# Patient Record
Sex: Female | Born: 1950 | Race: Black or African American | Hispanic: No | Smoking: Never smoker
Health system: Southern US, Community
[De-identification: ages and names within clinical notes are randomized; demographics above are authoritative.]

## PROBLEM LIST (undated history)

## (undated) DIAGNOSIS — H9191 Unspecified hearing loss, right ear: Secondary | ICD-10-CM

## (undated) DIAGNOSIS — E119 Type 2 diabetes mellitus without complications: Secondary | ICD-10-CM

## (undated) DIAGNOSIS — J302 Other seasonal allergic rhinitis: Secondary | ICD-10-CM

## (undated) DIAGNOSIS — I10 Essential (primary) hypertension: Secondary | ICD-10-CM

## (undated) DIAGNOSIS — K219 Gastro-esophageal reflux disease without esophagitis: Secondary | ICD-10-CM

## (undated) DIAGNOSIS — E785 Hyperlipidemia, unspecified: Secondary | ICD-10-CM

## (undated) HISTORY — PX: PARTIAL HYSTERECTOMY: SHX80

## (undated) HISTORY — DX: Hyperlipidemia, unspecified: E78.5

## (undated) HISTORY — DX: Essential (primary) hypertension: I10

## (undated) HISTORY — PX: HAMMER TOE SURGERY: SHX385

## (undated) HISTORY — DX: Gastro-esophageal reflux disease without esophagitis: K21.9

## (undated) HISTORY — DX: Other seasonal allergic rhinitis: J30.2

## (undated) HISTORY — DX: Type 2 diabetes mellitus without complications: E11.9

---

## 2001-05-19 DIAGNOSIS — I742 Embolism and thrombosis of arteries of the upper extremities: Secondary | ICD-10-CM

## 2001-05-19 HISTORY — DX: Embolism and thrombosis of arteries of the upper extremities: I74.2

## 2011-02-26 DIAGNOSIS — J069 Acute upper respiratory infection, unspecified: Secondary | ICD-10-CM | POA: Insufficient documentation

## 2012-05-20 LAB — HM COLONOSCOPY: HM Colonoscopy: NORMAL

## 2012-11-29 LAB — HM PAP SMEAR: HM Pap smear: NORMAL

## 2013-03-08 ENCOUNTER — Ambulatory Visit: Payer: Self-pay | Admitting: Internal Medicine

## 2013-03-08 IMAGING — CR CERVICAL SPINE - COMPLETE 4+ VIEW
1 series · 7 of 7 positions shown · non-contrast
Comparison: none

REASON FOR EXAM: neck pain, radiculopathy
COMMENTS:

[Series 1: lat · 0.17mm/px · 7 of 7 slices shown]
[im 1/7]
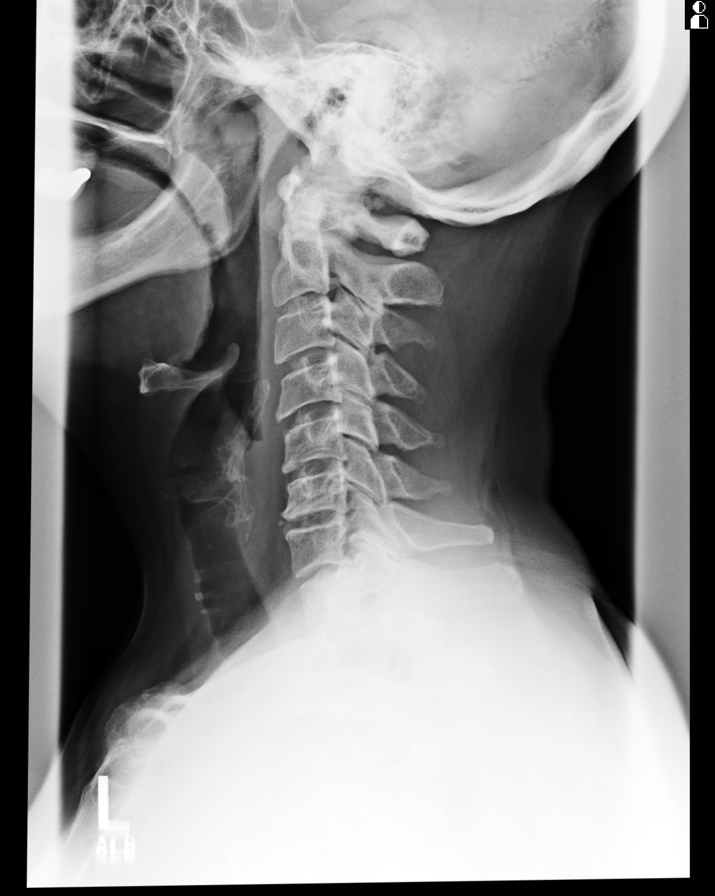
[im 2/7]
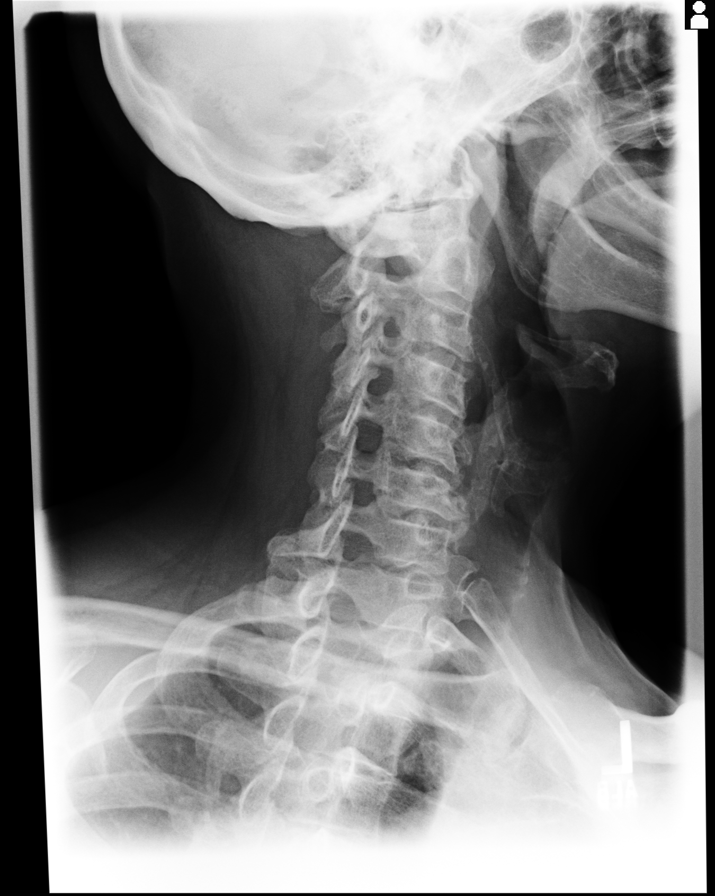
[im 3/7]
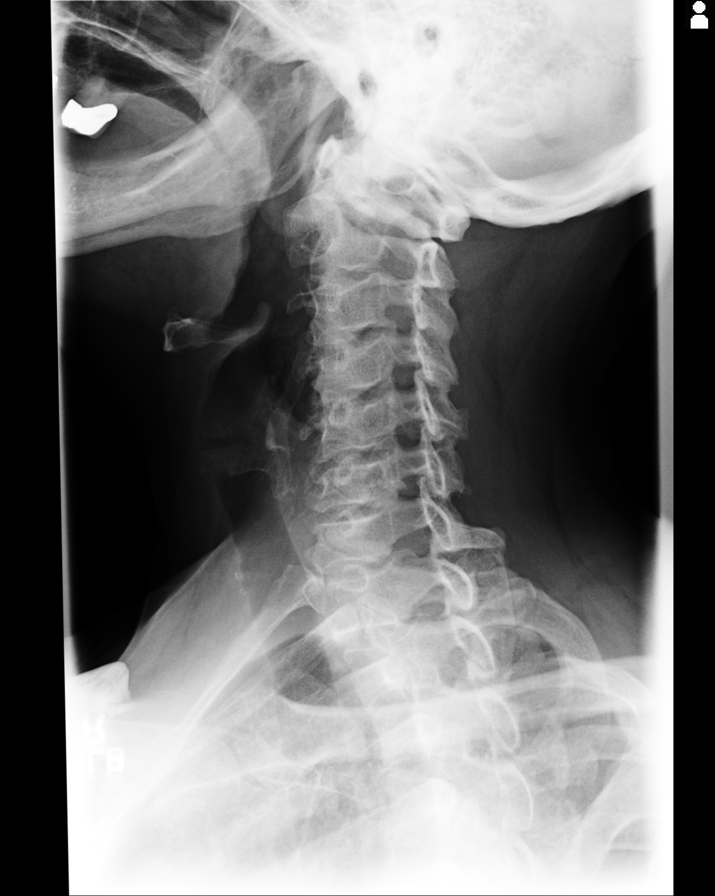
[im 4/7]
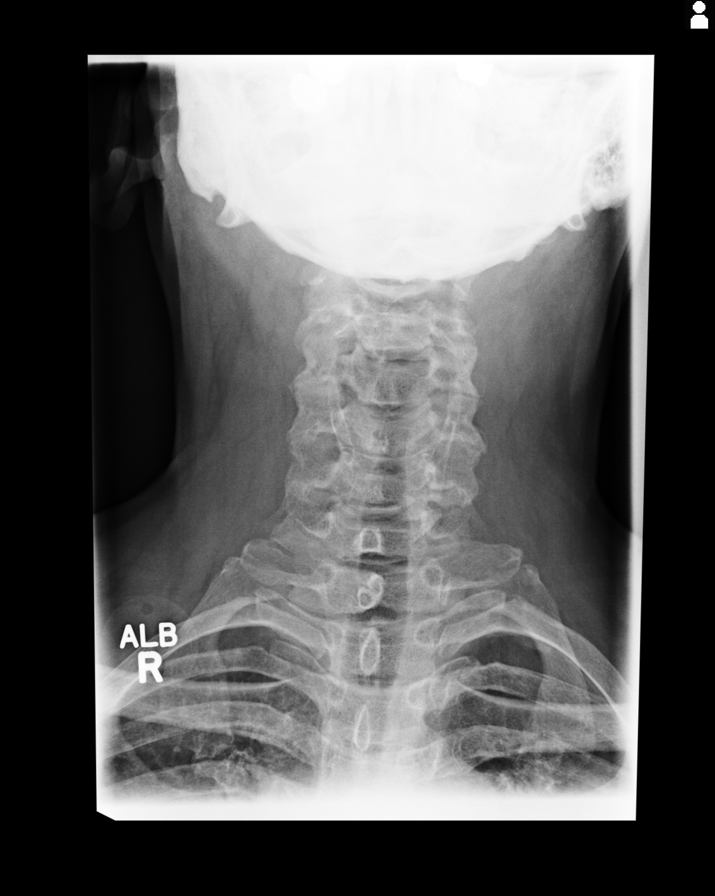
[im 5/7]
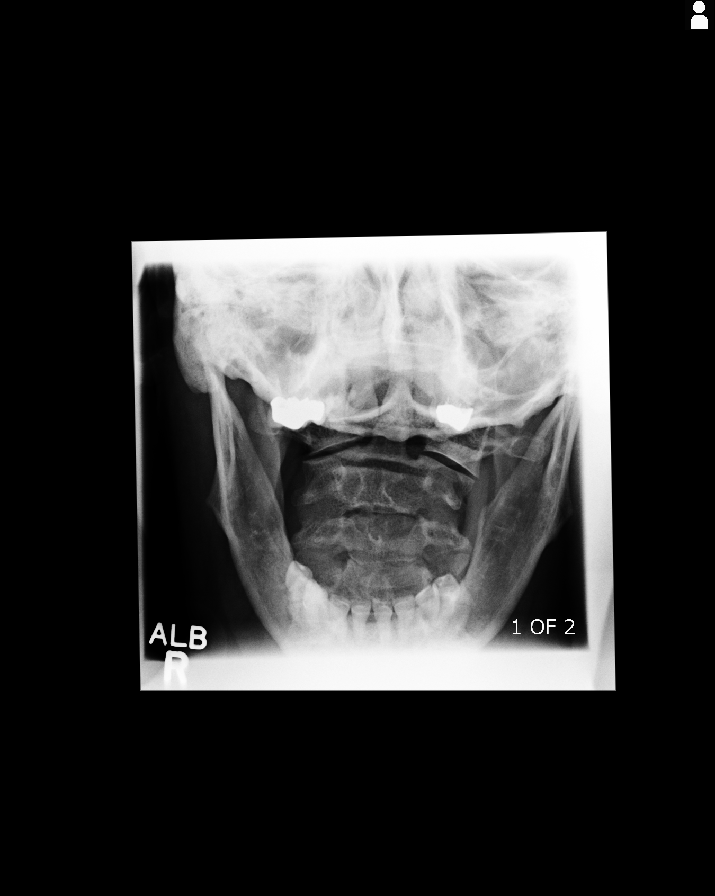
[im 6/7]
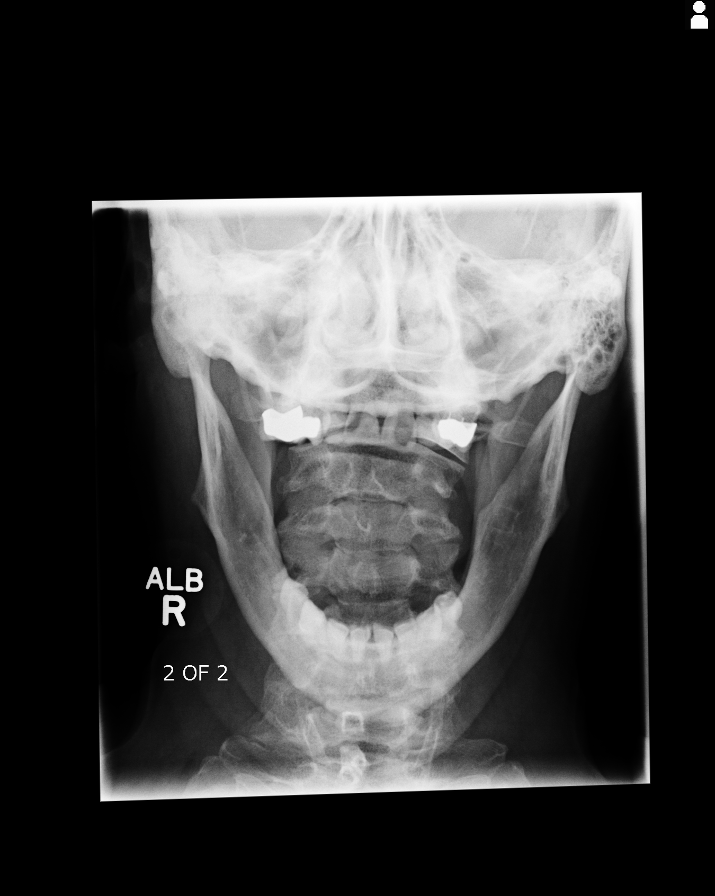
[im 7/7]
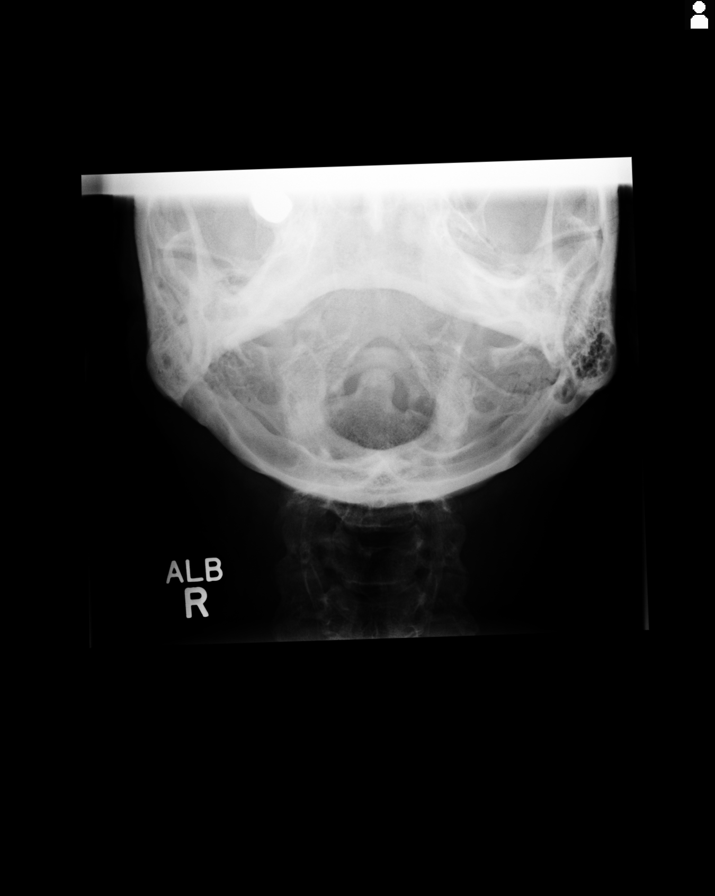

[7 of 7 positions shown; findings below may reference images not displayed]

PROCEDURE:     MDR - MDR CERVICAL SPINE COMPLETE  - [DATE]  [DATE]

RESULT:     There is loss of the normal cervical lordosis. The vertebral
body heights and intervertebral disc spaces are maintained. There is no
subluxation. The craniocervical junction appears to be unremarkable. There
is no bony destruction. There is normal alignment of the facets. Minimal
uncinate spurring is present normal left at C6-C7 and on the right at C5-C6.
The atlantoaxial alignment is normal. The odontoid appears intact.
IMPRESSION: 1. Mild degenerative change. No acute cervical spine bony abnormality or
severe bony degenerative disease.

[REDACTED]

## 2014-03-06 LAB — LIPID PANEL
Cholesterol: 146 mg/dL (ref 0–200)
HDL: 44 mg/dL (ref 35–70)
LDL Cholesterol: 88 mg/dL
Triglycerides: 70 mg/dL (ref 40–160)

## 2014-03-06 LAB — TSH: TSH: 1.3 u[IU]/mL (ref <=5.90)

## 2014-03-23 LAB — HM MAMMOGRAPHY: HM Mammogram: NORMAL

## 2014-03-28 ENCOUNTER — Ambulatory Visit: Payer: Self-pay | Admitting: Internal Medicine

## 2014-04-18 ENCOUNTER — Ambulatory Visit: Payer: Self-pay | Admitting: Internal Medicine

## 2014-05-19 ENCOUNTER — Ambulatory Visit: Payer: Self-pay | Admitting: Internal Medicine

## 2014-05-23 LAB — BASIC METABOLIC PANEL
BUN: 10 mg/dL (ref 4–21)
Creatinine: 0.6 mg/dL (ref <=1.1)

## 2014-05-23 LAB — HEMOGLOBIN A1C: Hgb A1c MFr Bld: 6.2 % — AB (ref 4.0–6.0)

## 2014-08-18 HISTORY — PX: CATARACT EXTRACTION: SUR2

## 2015-03-15 ENCOUNTER — Other Ambulatory Visit: Payer: Self-pay | Admitting: Internal Medicine

## 2015-03-15 ENCOUNTER — Encounter: Payer: Self-pay | Admitting: Internal Medicine

## 2015-03-15 DIAGNOSIS — R7989 Other specified abnormal findings of blood chemistry: Secondary | ICD-10-CM | POA: Insufficient documentation

## 2015-03-15 DIAGNOSIS — R2240 Localized swelling, mass and lump, unspecified lower limb: Secondary | ICD-10-CM | POA: Insufficient documentation

## 2015-03-15 DIAGNOSIS — K219 Gastro-esophageal reflux disease without esophagitis: Secondary | ICD-10-CM | POA: Insufficient documentation

## 2015-03-15 DIAGNOSIS — J302 Other seasonal allergic rhinitis: Secondary | ICD-10-CM | POA: Insufficient documentation

## 2015-03-15 DIAGNOSIS — E118 Type 2 diabetes mellitus with unspecified complications: Secondary | ICD-10-CM | POA: Insufficient documentation

## 2015-03-15 DIAGNOSIS — I10 Essential (primary) hypertension: Secondary | ICD-10-CM | POA: Insufficient documentation

## 2015-03-15 DIAGNOSIS — R945 Abnormal results of liver function studies: Secondary | ICD-10-CM | POA: Insufficient documentation

## 2015-03-19 ENCOUNTER — Encounter: Payer: Self-pay | Admitting: Internal Medicine

## 2015-03-19 ENCOUNTER — Ambulatory Visit (INDEPENDENT_AMBULATORY_CARE_PROVIDER_SITE_OTHER): Payer: BLUE CROSS/BLUE SHIELD | Admitting: Internal Medicine

## 2015-03-19 VITALS — BP 142/90 | HR 64 | Ht 64.0 in | Wt 167.4 lb

## 2015-03-19 DIAGNOSIS — Z1239 Encounter for other screening for malignant neoplasm of breast: Secondary | ICD-10-CM

## 2015-03-19 DIAGNOSIS — Z0000 Encounter for general adult medical examination without abnormal findings: Secondary | ICD-10-CM | POA: Diagnosis not present

## 2015-03-19 DIAGNOSIS — I10 Essential (primary) hypertension: Secondary | ICD-10-CM

## 2015-03-19 DIAGNOSIS — B353 Tinea pedis: Secondary | ICD-10-CM

## 2015-03-19 DIAGNOSIS — K219 Gastro-esophageal reflux disease without esophagitis: Secondary | ICD-10-CM

## 2015-03-19 DIAGNOSIS — Z23 Encounter for immunization: Secondary | ICD-10-CM | POA: Diagnosis not present

## 2015-03-19 DIAGNOSIS — E119 Type 2 diabetes mellitus without complications: Secondary | ICD-10-CM

## 2015-03-19 LAB — POCT URINALYSIS DIPSTICK
Bilirubin, UA: NEGATIVE
Blood, UA: NEGATIVE
Glucose, UA: NEGATIVE
Ketones, UA: NEGATIVE
Leukocytes, UA: NEGATIVE
Nitrite, UA: NEGATIVE
Protein, UA: NEGATIVE
Spec Grav, UA: 1.02
Urobilinogen, UA: 0.2
pH, UA: 5

## 2015-03-19 MED ORDER — IBUPROFEN 600 MG PO TABS: 600.0000 mg | ORAL_TABLET | Freq: Three times a day (TID) | ORAL | Status: DC | PRN

## 2015-03-19 NOTE — Progress Notes (Signed)
Date:  03/19/2015   Name:  Kathy Dougherty   DOB:  08/22/1950   MRN:  [id number]   Chief Complaint: Annual Exam Kathy Dougherty is a 64 y.o. female who presents today for her Complete Annual Exam. She feels well. She reports exercising intermittently. She reports she is sleeping well. She denies breast problems. She is due for mammogram. Hypertension This is a chronic problem. The current episode started more than 1 year ago. The problem is unchanged. The problem is controlled. Pertinent negatives include no chest pain, headaches, palpitations, peripheral edema or shortness of breath. There are no associated agents to hypertension. Past treatments include calcium channel blockers.  Diabetes She presents for her follow-up diabetic visit. She has type 2 diabetes mellitus. Her disease course has been stable. Pertinent negatives for hypoglycemia include no headaches. There are no diabetic associated symptoms. Pertinent negatives for diabetes include no chest pain, no fatigue, no polydipsia and no polyuria. Her weight is stable. She is following a generally healthy diet. Her breakfast blood glucose is taken between 7-8 am. Her breakfast blood glucose range is generally 90-110 mg/dl. Her dinner blood glucose is taken after 8 pm. Her dinner blood glucose range is generally 130-140 mg/dl. An ACE inhibitor/angiotensin II receptor blocker is not being taken. Eye exam is current.   joint pain -she has intermittent joint pain and stiffness primarily in the hips and knees. It's worse after sitting a while. After she gets moving she has no issues. No gait disturbance or falls. She takes ibuprofen 600 intermittently as needed. Seasonal allergies - patient has seasonal allergies with sneezing and postnasal drip and intermittent cough. She uses either Claritin or Zyrtec or Allegra-alternating among the 3. She also uses steroid nasal spray when necessary.   Review of Systems  Constitutional: Negative for fever, chills and  fatigue.  HENT: Positive for rhinorrhea. Negative for ear discharge, hearing loss, sneezing, sore throat, trouble swallowing and voice change.   Eyes: Negative for photophobia and visual disturbance.  Respiratory: Negative for cough, chest tightness and shortness of breath.   Cardiovascular: Negative for chest pain, palpitations and leg swelling.  Gastrointestinal: Negative for nausea, abdominal pain and constipation.  Endocrine: Negative for polydipsia and polyuria.  Genitourinary: Negative for dysuria, hematuria and pelvic pain.  Musculoskeletal: Positive for arthralgias. Negative for myalgias, back pain, joint swelling and gait problem.  Skin: Negative for color change and rash.  Neurological: Negative for syncope, light-headedness, numbness and headaches.  Hematological: Negative for adenopathy.  Psychiatric/Behavioral: Negative for sleep disturbance and dysphoric mood.    Patient Active Problem List   Diagnosis Date Noted  . Controlled type 2 diabetes mellitus without complication ([place]) 08/65/7846  . Abnormal LFTs 03/15/2015  . Essential (primary) hypertension 03/15/2015  . Gastro-esophageal reflux disease without esophagitis 03/15/2015  . Foot mass 03/15/2015  . Allergic rhinitis, seasonal 03/15/2015    Prior to Admission medications   Medication Sig Start Date End Date Taking? Authorizing Provider  amLODipine (NORVASC) 2.5 MG tablet Take 1 tablet by mouth daily. 08/30/14  Yes Historical Provider, MD  Cetirizine HCl (ZYRTEC ALLERGY) 10 MG CAPS Take 1 capsule by mouth daily as needed.   Yes Historical Provider, MD  glucose blood (ONE TOUCH ULTRA TEST) test strip  05/23/14  Yes Historical Provider, MD  metFORMIN (GLUCOPHAGE) 500 MG tablet Take 1 tablet by mouth 2 (two) times daily. 08/30/14  Yes Historical Provider, MD  mometasone (NASONEX) 50 MCG/ACT nasal spray Place 2 sprays into the nose daily.   Yes Historical  Provider, MD  omeprazole (PRILOSEC) 40 MG capsule Take 1 capsule by  mouth daily. 03/06/14  Yes Historical Provider, MD    Allergies  Allergen Reactions  . Penicillins Rash  . Sulfa Antibiotics Rash    Past Surgical History  Procedure Laterality Date  . Partial hysterectomy      fibroids    Social History  Substance Use Topics  . Smoking status: Never Smoker   . Smokeless tobacco: None  . Alcohol Use: No     Medication list has been reviewed and updated.   Physical Exam  Constitutional: She is oriented to person, place, and time. She appears well-developed and well-nourished. No distress.  HENT:  Head: Normocephalic and atraumatic.  Right Ear: Tympanic membrane and ear canal normal.  Left Ear: Tympanic membrane and ear canal normal.  Nose: Right sinus exhibits no maxillary sinus tenderness. Left sinus exhibits no maxillary sinus tenderness.  Mouth/Throat: Uvula is midline and oropharynx is clear and moist.  Eyes: Conjunctivae and EOM are normal. Right eye exhibits no discharge. Left eye exhibits no discharge. No scleral icterus.  Neck: Normal range of motion. Carotid bruit is not present. No erythema present. No thyromegaly present.  Cardiovascular: Normal rate, regular rhythm, normal heart sounds and normal pulses.   Pulmonary/Chest: Effort normal. No respiratory distress. She has no wheezes. Right breast exhibits no mass, no nipple discharge, no skin change and no tenderness. Left breast exhibits no mass, no nipple discharge, no skin change and no tenderness.  Abdominal: Soft. Bowel sounds are normal. There is no hepatosplenomegaly. There is no tenderness. There is no CVA tenderness.  Musculoskeletal: Normal range of motion.  Lymphadenopathy:    She has no cervical adenopathy.    She has no axillary adenopathy.  Neurological: She is alert and oriented to person, place, and time. She has normal reflexes. No cranial nerve deficit or sensory deficit.  Monofilament exam normal  Skin: Skin is warm and dry. No rash noted.  Tinea pedis changes  with flaking and peeling between the toes of the right foot  Psychiatric: She has a normal mood and affect. Her speech is normal and behavior is normal. Thought content normal.  Nursing note and vitals reviewed.   BP 142/90 mmHg  Pulse 64  Ht 5\' 4"  (1.626 m)  Wt 167 lb 6.4 oz (75.932 kg)  BMI 28.72 kg/m2  Assessment and Plan: 1. Annual physical exam Pap Smear done in 2014 - patient advised no further Pap smears are needed - POCT urinalysis dipstick  2. Flu vaccine need - Flu Vaccine QUAD 36+ mos PF IM (Fluarix & Fluzone Quad PF)  3. Essential (primary) hypertension Fair control continue current medication - CBC with Differential/Platelet - Comprehensive metabolic panel  4. Gastro-esophageal reflux disease without esophagitis Stable on intermittent use of Prilosec - CBC with Differential/Platelet  5. Controlled type 2 diabetes mellitus without complication, without long-term current use of insulin (HCC) Continue current regimen On thighs on medication change if needed - Hemoglobin A1c - Lipid panel - TSH - Microalbumin / creatinine urine ratio  6. Breast cancer screening Patient will schedule mammogram at [facility]  7. Tinea pedis, right Begin over-the-counter athlete's foot treatment   [name], MD [facility]  03/19/2015

## 2015-03-19 NOTE — Patient Instructions (Signed)
Breast Self-Awareness Practicing breast self-awareness may pick up problems early, prevent significant medical complications, and possibly save your life. By practicing breast self-awareness, you can become familiar with how your breasts look and feel and if your breasts are changing. This allows you to notice changes early. It can also offer you some reassurance that your breast health is good. One way to learn what is normal for your breasts and whether your breasts are changing is to do a breast self-exam. If you find a lump or something that was not present in the past, it is best to contact your caregiver right away. Other findings that should be evaluated by your caregiver include nipple discharge, especially if it is bloody; skin changes or reddening; areas where the skin seems to be pulled in (retracted); or new lumps and bumps. Breast pain is seldom associated with cancer (malignancy), but should also be evaluated by a caregiver. HOW TO PERFORM A BREAST SELF-EXAM The best time to examine your breasts is 5-7 days after your menstrual period is over. During menstruation, the breasts are lumpier, and it may be more difficult to pick up changes. If you do not menstruate, have reached menopause, or had your uterus removed (hysterectomy), you should examine your breasts at regular intervals, such as monthly. If you are breastfeeding, examine your breasts after a feeding or after using a breast pump. Breast implants do not decrease the risk for lumps or tumors, so continue to perform breast self-exams as recommended. Talk to your caregiver about how to determine the difference between the implant and breast tissue. Also, talk about the amount of pressure you should use during the exam. Over time, you will become more familiar with the variations of your breasts and more comfortable with the exam. A breast self-exam requires you to remove all your clothes above the waist. 1. Look at your breasts and nipples.  Stand in front of a mirror in a room with good lighting. With your hands on your hips, push your hands firmly downward. Look for a difference in shape, contour, and size from one breast to the other (asymmetry). Asymmetry includes puckers, dips, or bumps. Also, look for skin changes, such as reddened or scaly areas on the breasts. Look for nipple changes, such as discharge, dimpling, repositioning, or redness. 2. Carefully feel your breasts. This is best done either in the shower or tub while using soapy water or when flat on your back. Place the arm (on the side of the breast you are examining) above your head. Use the pads (not the fingertips) of your three middle fingers on your opposite hand to feel your breasts. Start in the underarm area and use  inch (2 cm) overlapping circles to feel your breast. Use 3 different levels of pressure (light, medium, and firm pressure) at each circle before moving to the next circle. The light pressure is needed to feel the tissue closest to the skin. The medium pressure will help to feel breast tissue a little deeper, while the firm pressure is needed to feel the tissue close to the ribs. Continue the overlapping circles, moving downward over the breast until you feel your ribs below your breast. Then, move one finger-width towards the center of the body. Continue to use the  inch (2 cm) overlapping circles to feel your breast as you move slowly up toward the collar bone (clavicle) near the base of the neck. Continue the up and down exam using all 3 pressures until you reach the   middle of the chest. Do this with each breast, carefully feeling for lumps or changes. 3.  Keep a written record with breast changes or normal findings for each breast. By writing this information down, you do not need to depend only on memory for size, tenderness, or location. Write down where you are in your menstrual cycle, if you are still menstruating. Breast tissue can have some lumps or  thick tissue. However, see your caregiver if you find anything that concerns you.  SEEK MEDICAL CARE IF:  You see a change in shape, contour, or size of your breasts or nipples.   You see skin changes, such as reddened or scaly areas on the breasts or nipples.   You have an unusual discharge from your nipples.   You feel a new lump or unusually thick areas.    This information is not intended to replace advice given to you by your health care provider. Make sure you discuss any questions you have with your health care provider.   Document Released: 05/05/2005 Document Revised: 04/21/2012 Document Reviewed: 08/20/2011 Elsevier Interactive Patient Education 2016 [employer]  

## 2015-03-20 LAB — COMPREHENSIVE METABOLIC PANEL
ALT: 20 IU/L (ref 0–32)
AST: 24 IU/L (ref 0–40)
Albumin/Globulin Ratio: 1.3 (ref 1.1–2.5)
Albumin: 4.3 g/dL (ref 3.6–4.8)
Alkaline Phosphatase: 83 IU/L (ref 39–117)
BUN/Creatinine Ratio: 16 (ref 11–26)
BUN: 9 mg/dL (ref 8–27)
Bilirubin Total: 0.5 mg/dL (ref 0.0–1.2)
CO2: 26 mmol/L (ref 18–29)
Calcium: 9.3 mg/dL (ref 8.7–10.3)
Chloride: 101 mmol/L (ref 97–106)
Creatinine, Ser: 0.57 mg/dL (ref 0.57–1.00)
GFR calc Af Amer: 113 mL/min/{1.73_m2} (ref >59)
GFR calc non Af Amer: 98 mL/min/{1.73_m2} (ref >59)
Globulin, Total: 3.3 g/dL (ref 1.5–4.5)
Glucose: 132 mg/dL — ABNORMAL HIGH (ref 65–99)
Potassium: 4.2 mmol/L (ref 3.5–5.2)
Sodium: 142 mmol/L (ref 136–144)
Total Protein: 7.6 g/dL (ref 6.0–8.5)

## 2015-03-20 LAB — CBC WITH DIFFERENTIAL/PLATELET
Basophils Absolute: 0 10*3/uL (ref 0.0–0.2)
Basos: 1 %
EOS (ABSOLUTE): 0.1 10*3/uL (ref 0.0–0.4)
Eos: 2 %
Hematocrit: 39.2 % (ref 34.0–46.6)
Hemoglobin: 13.3 g/dL (ref 11.1–15.9)
Immature Grans (Abs): 0 10*3/uL (ref 0.0–0.1)
Immature Granulocytes: 0 %
Lymphocytes Absolute: 1.8 10*3/uL (ref 0.7–3.1)
Lymphs: 37 %
MCH: 29.6 pg (ref 26.6–33.0)
MCHC: 33.9 g/dL (ref 31.5–35.7)
MCV: 87 fL (ref 79–97)
Monocytes Absolute: 0.4 10*3/uL (ref 0.1–0.9)
Monocytes: 8 %
Neutrophils Absolute: 2.5 10*3/uL (ref 1.4–7.0)
Neutrophils: 52 %
Platelets: 204 10*3/uL (ref 150–379)
RBC: 4.49 x10E6/uL (ref 3.77–5.28)
RDW: 13.2 % (ref 12.3–15.4)
WBC: 4.8 10*3/uL (ref 3.4–10.8)

## 2015-03-20 LAB — MICROALBUMIN / CREATININE URINE RATIO
Creatinine, Urine: 85.5 mg/dL
MICROALB/CREAT RATIO: 5.8 mg/g creat (ref 0.0–30.0)
Microalbumin, Urine: 5 ug/mL

## 2015-03-20 LAB — LIPID PANEL
Chol/HDL Ratio: 3.2 ratio units (ref 0.0–4.4)
Cholesterol, Total: 144 mg/dL (ref 100–199)
HDL: 45 mg/dL (ref >39)
LDL Calculated: 87 mg/dL (ref 0–99)
Triglycerides: 60 mg/dL (ref 0–149)

## 2015-03-20 LAB — HEMOGLOBIN A1C: Hgb A1c MFr Bld: 6.3 % — ABNORMAL HIGH (ref 4.8–5.6)

## 2015-03-20 LAB — TSH: TSH: 1.07 u[IU]/mL (ref 0.450–4.500)

## 2015-06-19 ENCOUNTER — Encounter: Payer: Self-pay | Admitting: Internal Medicine

## 2015-06-19 ENCOUNTER — Ambulatory Visit (INDEPENDENT_AMBULATORY_CARE_PROVIDER_SITE_OTHER): Payer: BLUE CROSS/BLUE SHIELD | Admitting: Internal Medicine

## 2015-06-19 DIAGNOSIS — J40 Bronchitis, not specified as acute or chronic: Secondary | ICD-10-CM | POA: Diagnosis not present

## 2015-06-19 MED ORDER — HYDROCODONE-HOMATROPINE 5-1.5 MG/5ML PO SYRP: 5.0000 mL | ORAL_SOLUTION | Freq: Four times a day (QID) | ORAL | Status: DC | PRN

## 2015-06-19 MED ORDER — LEVOFLOXACIN 500 MG PO TABS: 500.0000 mg | ORAL_TABLET | Freq: Every day | ORAL | Status: DC

## 2015-06-19 NOTE — Progress Notes (Signed)
Date:  06/19/2015   Name:  Kathy Dougherty   DOB:  08/23/1950   MRN:  [id number]   Chief Complaint: Cough Cough This is a new problem. The current episode started in the past 7 days. The problem has been gradually worsening. The problem occurs constantly. The cough is productive of purulent sputum. Associated symptoms include chills, a fever, headaches and myalgias.    Review of Systems  Constitutional: Positive for fever and chills.  Respiratory: Positive for cough.   Musculoskeletal: Positive for myalgias.  Neurological: Positive for headaches.    Patient Active Problem List   Diagnosis Date Noted  . Controlled type 2 diabetes mellitus without complication ([place]) XX123456  . Abnormal LFTs 03/15/2015  . Essential (primary) hypertension 03/15/2015  . Gastro-esophageal reflux disease without esophagitis 03/15/2015  . Foot mass 03/15/2015  . Allergic rhinitis, seasonal 03/15/2015    Prior to Admission medications   Medication Sig Start Date End Date Taking? Authorizing Provider  amLODipine (NORVASC) 2.5 MG tablet Take 1 tablet by mouth daily. 08/30/14  Yes Historical Provider, MD  Cetirizine HCl (ZYRTEC ALLERGY) 10 MG CAPS Take 1 capsule by mouth daily as needed.   Yes Historical Provider, MD  glucose blood (ONE TOUCH ULTRA TEST) test strip  05/23/14  Yes Historical Provider, MD  ibuprofen (ADVIL,MOTRIN) 600 MG tablet Take 1 tablet (600 mg total) by mouth every 8 (eight) hours as needed. 03/19/15  Yes [name], MD  metFORMIN (GLUCOPHAGE) 500 MG tablet Take 1 tablet by mouth 2 (two) times daily. 08/30/14  Yes Historical Provider, MD  mometasone (NASONEX) 50 MCG/ACT nasal spray Place 2 sprays into the nose daily.   Yes Historical Provider, MD  omeprazole (PRILOSEC) 40 MG capsule Take 1 capsule by mouth daily. 03/06/14  Yes Historical Provider, MD    Allergies  Allergen Reactions  . Penicillins Rash  . Sulfa Antibiotics Rash    Past Surgical History  Procedure  Laterality Date  . Partial hysterectomy      fibroids  . Cataract extraction  08/2014    Social History  Substance Use Topics  . Smoking status: Never Smoker   . Smokeless tobacco: None  . Alcohol Use: No   Lab Results  Component Value Date   HGBA1C 6.3* 03/19/2015   Lab Results  Component Value Date   CREATININE 0.57 03/19/2015     Medication list has been reviewed and updated.   Physical Exam  Constitutional: She is oriented to person, place, and time. She appears well-developed and well-nourished.  HENT:  Right Ear: External ear and ear canal normal.  Left Ear: External ear and ear canal normal. Tympanic membrane is not erythematous and not retracted.  Nose: Right sinus exhibits no maxillary sinus tenderness and no frontal sinus tenderness. Left sinus exhibits no maxillary sinus tenderness and no frontal sinus tenderness.  Mouth/Throat: Uvula is midline and mucous membranes are normal. No oral lesions. No oropharyngeal exudate or posterior oropharyngeal erythema.  Cardiovascular: Normal rate, regular rhythm and normal heart sounds.   Pulmonary/Chest: Effort normal and breath sounds normal. She has no wheezes. She has no rhonchi.  Lymphadenopathy:    She has no cervical adenopathy.  Neurological: She is alert and oriented to person, place, and time.  Psychiatric: She has a normal mood and affect.    BP 162/90 mmHg  Pulse 73  Temp(Src) 98.1 F (36.7 C) (Oral)  Ht 5\' 2"  (1.575 m)  Wt 174 lb (78.926 kg)  BMI 31.82 kg/m2  SpO2  98%  Assessment and Plan: 1. Bronchitis Increase fluids, rest - HYDROcodone-homatropine (HYCODAN) 5-1.5 MG/5ML syrup; Take 5 mLs by mouth every 6 (six) hours as needed for cough.  Dispense: 120 mL; Refill: 0 - levofloxacin (LEVAQUIN) 500 MG tablet; Take 1 tablet (500 mg total) by mouth daily.  Dispense: 7 tablet; Refill: 0   [name], MD [facility]  06/19/2015

## 2015-06-19 NOTE — Patient Instructions (Signed)

## 2015-06-22 ENCOUNTER — Telehealth: Payer: Self-pay

## 2015-06-22 MED ORDER — METHYLPREDNISOLONE 4 MG PO TBPK: ORAL_TABLET | ORAL | Status: DC

## 2015-06-22 NOTE — Telephone Encounter (Signed)
Patient states that she is still having a cough that is bringing up thick mucus. She would like to know if she could get a prednisone taper sent in to the [facility]. Please Advise.

## 2015-06-22 NOTE — Telephone Encounter (Signed)
Prednisone taper sent to pharmacy. 

## 2015-09-12 ENCOUNTER — Other Ambulatory Visit: Payer: Self-pay | Admitting: Internal Medicine

## 2015-09-17 ENCOUNTER — Encounter: Payer: Self-pay | Admitting: Internal Medicine

## 2015-09-17 ENCOUNTER — Other Ambulatory Visit: Payer: Self-pay

## 2015-09-17 ENCOUNTER — Ambulatory Visit (INDEPENDENT_AMBULATORY_CARE_PROVIDER_SITE_OTHER): Payer: BLUE CROSS/BLUE SHIELD | Admitting: Internal Medicine

## 2015-09-17 VITALS — BP 138/86 | HR 72 | Temp 97.8°F | Resp 16 | Ht 62.0 in | Wt 174.0 lb

## 2015-09-17 DIAGNOSIS — E119 Type 2 diabetes mellitus without complications: Secondary | ICD-10-CM | POA: Diagnosis not present

## 2015-09-17 DIAGNOSIS — I10 Essential (primary) hypertension: Secondary | ICD-10-CM | POA: Diagnosis not present

## 2015-09-17 DIAGNOSIS — K219 Gastro-esophageal reflux disease without esophagitis: Secondary | ICD-10-CM | POA: Diagnosis not present

## 2015-09-17 DIAGNOSIS — J302 Other seasonal allergic rhinitis: Secondary | ICD-10-CM | POA: Diagnosis not present

## 2015-09-17 MED ORDER — LEVOCETIRIZINE DIHYDROCHLORIDE 5 MG PO TABS: 5.0000 mg | ORAL_TABLET | Freq: Every evening | ORAL | Status: DC

## 2015-09-17 MED ORDER — METFORMIN HCL 500 MG PO TABS: 500.0000 mg | ORAL_TABLET | Freq: Two times a day (BID) | ORAL | Status: DC

## 2015-09-17 MED ORDER — OMEPRAZOLE 40 MG PO CPDR: 40.0000 mg | DELAYED_RELEASE_CAPSULE | Freq: Every day | ORAL | Status: DC

## 2015-09-17 MED ORDER — LISINOPRIL 10 MG PO TABS: 10.0000 mg | ORAL_TABLET | Freq: Every day | ORAL | Status: DC

## 2015-09-17 NOTE — Progress Notes (Signed)
Date:  09/17/2015   Name:  Kathy Dougherty   DOB:  01-29-1951   MRN:  [id number]   Chief Complaint: Diabetes and Hypertension Diabetes She presents for her follow-up diabetic visit. She has type 2 diabetes mellitus. Her disease course has been stable. Pertinent negatives for hypoglycemia include no headaches or tremors. Pertinent negatives for diabetes include no chest pain, no fatigue, no polydipsia and no polyuria. Symptoms are stable. There is no compliance with monitoring of blood glucose. An ACE inhibitor/angiotensin II receptor blocker is not being taken.  Hypertension This is a chronic problem. The current episode started more than 1 year ago. The problem is unchanged. The problem is controlled. Pertinent negatives include no chest pain, headaches, palpitations or shortness of breath. Past treatments include calcium channel blockers. The current treatment provides moderate (BP at home average 140/80) improvement.  cough - she has a dry cough that mostly affects her at night.  She has known reflux and allergies.  She takes antihistamines regularly with some benefit and only takes omeprazole for heartburn.  Lab Results  Component Value Date   HGBA1C 6.3* 03/19/2015    Lab Results  Component Value Date   CREATININE 0.57 03/19/2015     Review of Systems  Constitutional: Negative for fever, appetite change, fatigue and unexpected weight change.  HENT: Positive for congestion and postnasal drip. Negative for tinnitus, trouble swallowing and voice change.   Eyes: Negative for visual disturbance.  Respiratory: Positive for cough. Negative for chest tightness, shortness of breath and wheezing.   Cardiovascular: Negative for chest pain, palpitations and leg swelling.  Gastrointestinal: Negative for abdominal pain.  Endocrine: Negative for polydipsia and polyuria.  Genitourinary: Negative for dysuria and hematuria.  Musculoskeletal: Negative for arthralgias.  Skin: Negative for rash.    Allergic/Immunologic: Positive for environmental allergies.  Neurological: Negative for tremors, numbness and headaches.  Psychiatric/Behavioral: Negative for dysphoric mood.    Patient Active Problem List   Diagnosis Date Noted  . Controlled type 2 diabetes mellitus without complication ([place]) XX123456  . Abnormal LFTs 03/15/2015  . Essential (primary) hypertension 03/15/2015  . Gastro-esophageal reflux disease without esophagitis 03/15/2015  . Foot mass 03/15/2015  . Allergic rhinitis, seasonal 03/15/2015  . Infection of the upper respiratory tract 02/26/2011    Prior to Admission medications   Medication Sig Start Date End Date Taking? Authorizing Provider  amLODipine (NORVASC) 2.5 MG tablet TAKE 1 TABLET BY MOUTH EVERY DAY 09/12/15  Yes [name], MD  Cetirizine HCl (ZYRTEC ALLERGY) 10 MG CAPS Take 1 capsule by mouth daily as needed.   Yes Historical Provider, MD  glucose blood (ONE TOUCH ULTRA TEST) test strip  05/23/14  Yes Historical Provider, MD  ibuprofen (ADVIL,MOTRIN) 600 MG tablet Take 1 tablet (600 mg total) by mouth every 8 (eight) hours as needed. 03/19/15  Yes [name], MD  metFORMIN (GLUCOPHAGE) 500 MG tablet Take 1 tablet by mouth 2 (two) times daily. 08/30/14  Yes Historical Provider, MD  mometasone (NASONEX) 50 MCG/ACT nasal spray Place 2 sprays into the nose daily.   Yes Historical Provider, MD  omeprazole (PRILOSEC) 40 MG capsule Take 1 capsule by mouth daily. 03/06/14  Yes Historical Provider, MD  HYDROcodone-homatropine (HYCODAN) 5-1.5 MG/5ML syrup Take 5 mLs by mouth every 6 (six) hours as needed for cough. Patient not taking: Reported on 09/17/2015 06/19/15   [name], MD  levofloxacin (LEVAQUIN) 500 MG tablet Take 1 tablet (500 mg total) by mouth daily. Patient not  taking: Reported on 09/17/2015 06/19/15   [name], MD  methylPREDNISolone (MEDROL DOSEPAK) 4 MG TBPK tablet Take 6 pills on day 1 the 5 pills day 2 then 4 pills day 3 then 3  pills day 4 then 2 pills day 5 then one pills day 6 then stop Patient not taking: Reported on 09/17/2015 06/22/15   [name], MD    Allergies  Allergen Reactions  . Penicillins Rash  . Sulfa Antibiotics Rash    Past Surgical History  Procedure Laterality Date  . Partial hysterectomy      fibroids  . Cataract extraction  08/2014    Social History  Substance Use Topics  . Smoking status: Never Smoker   . Smokeless tobacco: None  . Alcohol Use: No     Medication list has been reviewed and updated.   Physical Exam  Constitutional: She is oriented to person, place, and time. She appears well-developed. No distress.  HENT:  Head: Normocephalic and atraumatic.  Neck: Normal range of motion. Neck supple. Carotid bruit is not present.  Cardiovascular: Regular rhythm and normal heart sounds.   Pulmonary/Chest: Effort normal and breath sounds normal. No respiratory distress.  Neurological: She is alert and oriented to person, place, and time.  Skin: Skin is warm and dry. No rash noted.  Psychiatric: She has a normal mood and affect. Her behavior is normal. Thought content normal.  Nursing note and vitals reviewed.   BP 138/86 mmHg  Pulse 72  Temp(Src) 97.8 F (36.6 C) (Oral)  Resp 16  Ht 5\' 2"  (1.575 m)  Wt 174 lb (78.926 kg)  BMI 31.82 kg/m2  SpO2 99%  Assessment and Plan: 1. Essential (primary) hypertension Add ACEI - lisinopril (PRINIVIL,ZESTRIL) 10 MG tablet; Take 1 tablet (10 mg total) by mouth daily.  Dispense: 30 tablet; Refill: 12  2. Controlled type 2 diabetes mellitus without complication, without long-term current use of insulin (HCC) Continue oral agents Check BS several times per month - Hemoglobin A1c - metFORMIN (GLUCOPHAGE) 500 MG tablet; Take 1 tablet (500 mg total) by mouth 2 (two) times daily.  Dispense: 60 tablet; Refill: 12  3. Gastro-esophageal reflux disease without esophagitis Take omeprazole daily to determine improvement in cough -  omeprazole (PRILOSEC) 40 MG capsule; Take 1 capsule (40 mg total) by mouth daily.  Dispense: 30 capsule; Refill: 12  4. Allergic rhinitis, seasonal - levocetirizine (XYZAL) 5 MG tablet; Take 1 tablet (5 mg total) by mouth every evening.  Dispense: 30 tablet; Refill: Levasy, MD [facility]  09/17/2015

## 2015-09-17 NOTE — Patient Instructions (Signed)
Breast Self-Awareness Practicing breast self-awareness may pick up problems early, prevent significant medical complications, and possibly save your life. By practicing breast self-awareness, you can become familiar with how your breasts look and feel and if your breasts are changing. This allows you to notice changes early. It can also offer you some reassurance that your breast health is good. One way to learn what is normal for your breasts and whether your breasts are changing is to do a breast self-exam. If you find a lump or something that was not present in the past, it is best to contact your caregiver right away. Other findings that should be evaluated by your caregiver include nipple discharge, especially if it is bloody; skin changes or reddening; areas where the skin seems to be pulled in (retracted); or new lumps and bumps. Breast pain is seldom associated with cancer (malignancy), but should also be evaluated by a caregiver. HOW TO PERFORM A BREAST SELF-EXAM The best time to examine your breasts is 5-7 days after your menstrual period is over. During menstruation, the breasts are lumpier, and it may be more difficult to pick up changes. If you do not menstruate, have reached menopause, or had your uterus removed (hysterectomy), you should examine your breasts at regular intervals, such as monthly. If you are breastfeeding, examine your breasts after a feeding or after using a breast pump. Breast implants do not decrease the risk for lumps or tumors, so continue to perform breast self-exams as recommended. Talk to your caregiver about how to determine the difference between the implant and breast tissue. Also, talk about the amount of pressure you should use during the exam. Over time, you will become more familiar with the variations of your breasts and more comfortable with the exam. A breast self-exam requires you to remove all your clothes above the waist. 1. Look at your breasts and nipples.  Stand in front of a mirror in a room with good lighting. With your hands on your hips, push your hands firmly downward. Look for a difference in shape, contour, and size from one breast to the other (asymmetry). Asymmetry includes puckers, dips, or bumps. Also, look for skin changes, such as reddened or scaly areas on the breasts. Look for nipple changes, such as discharge, dimpling, repositioning, or redness. 2. Carefully feel your breasts. This is best done either in the shower or tub while using soapy water or when flat on your back. Place the arm (on the side of the breast you are examining) above your head. Use the pads (not the fingertips) of your three middle fingers on your opposite hand to feel your breasts. Start in the underarm area and use  inch (2 cm) overlapping circles to feel your breast. Use 3 different levels of pressure (light, medium, and firm pressure) at each circle before moving to the next circle. The light pressure is needed to feel the tissue closest to the skin. The medium pressure will help to feel breast tissue a little deeper, while the firm pressure is needed to feel the tissue close to the ribs. Continue the overlapping circles, moving downward over the breast until you feel your ribs below your breast. Then, move one finger-width towards the center of the body. Continue to use the  inch (2 cm) overlapping circles to feel your breast as you move slowly up toward the collar bone (clavicle) near the base of the neck. Continue the up and down exam using all 3 pressures until you reach the   middle of the chest. Do this with each breast, carefully feeling for lumps or changes. 3.  Keep a written record with breast changes or normal findings for each breast. By writing this information down, you do not need to depend only on memory for size, tenderness, or location. Write down where you are in your menstrual cycle, if you are still menstruating. Breast tissue can have some lumps or  thick tissue. However, see your caregiver if you find anything that concerns you.  SEEK MEDICAL CARE IF:  You see a change in shape, contour, or size of your breasts or nipples.   You see skin changes, such as reddened or scaly areas on the breasts or nipples.   You have an unusual discharge from your nipples.   You feel a new lump or unusually thick areas.    This information is not intended to replace advice given to you by your health care provider. Make sure you discuss any questions you have with your health care provider.   Document Released: 05/05/2005 Document Revised: 04/21/2012 Document Reviewed: 08/20/2011 Elsevier Interactive Patient Education 2016 [employer]  

## 2015-09-18 LAB — HEMOGLOBIN A1C
Est. average glucose Bld gHb Est-mCnc: 180 mg/dL
Hgb A1c MFr Bld: 7.9 % — ABNORMAL HIGH (ref 4.8–5.6)

## 2015-09-19 ENCOUNTER — Other Ambulatory Visit: Payer: Self-pay | Admitting: Internal Medicine

## 2015-09-19 DIAGNOSIS — E119 Type 2 diabetes mellitus without complications: Secondary | ICD-10-CM

## 2015-09-19 MED ORDER — METFORMIN HCL 500 MG PO TABS: 1000.0000 mg | ORAL_TABLET | Freq: Two times a day (BID) | ORAL | Status: DC

## 2016-01-14 ENCOUNTER — Telehealth: Payer: Self-pay

## 2016-01-14 NOTE — Telephone Encounter (Signed)
Requesting refill Vicodin she took back in January but reports no longer taking in May. States she still has knee pain and I advised OV since we have not seen her for this in some time and have not filled new Rx for 7 months. She is scheduling OV.

## 2016-01-14 NOTE — Telephone Encounter (Signed)
She was not given Vicodin for knee pain by me.  She was seen for bronchitis and got a cough syrup Rx that contained Vicodin.   I will not be prescribing Vicodin for knee pain so if that is the reason for her visit, she needs to know that I will not prescribe it.

## 2016-01-14 NOTE — Telephone Encounter (Signed)
Left message advising OV.

## 2016-01-15 ENCOUNTER — Ambulatory Visit
Admission: RE | Admit: 2016-01-15 | Discharge: 2016-01-15 | Disposition: A | Discharge status: Home / Self Care | Payer: BLUE CROSS/BLUE SHIELD | Source: Ambulatory Visit | Attending: Internal Medicine | Admitting: Internal Medicine

## 2016-01-15 ENCOUNTER — Ambulatory Visit (INDEPENDENT_AMBULATORY_CARE_PROVIDER_SITE_OTHER): Payer: BLUE CROSS/BLUE SHIELD | Admitting: Internal Medicine

## 2016-01-15 ENCOUNTER — Encounter: Payer: Self-pay | Admitting: Internal Medicine

## 2016-01-15 VITALS — BP 143/93 | HR 97 | Resp 16 | Ht 62.0 in | Wt 174.0 lb

## 2016-01-15 DIAGNOSIS — I10 Essential (primary) hypertension: Secondary | ICD-10-CM | POA: Diagnosis not present

## 2016-01-15 DIAGNOSIS — M1711 Unilateral primary osteoarthritis, right knee: Secondary | ICD-10-CM | POA: Diagnosis not present

## 2016-01-15 DIAGNOSIS — E119 Type 2 diabetes mellitus without complications: Secondary | ICD-10-CM | POA: Diagnosis not present

## 2016-01-15 DIAGNOSIS — M129 Arthropathy, unspecified: Secondary | ICD-10-CM | POA: Diagnosis not present

## 2016-01-15 DIAGNOSIS — M25561 Pain in right knee: Secondary | ICD-10-CM | POA: Insufficient documentation

## 2016-01-15 MED ORDER — TRAMADOL HCL 50 MG PO TABS: 50.0000 mg | ORAL_TABLET | Freq: Three times a day (TID) | ORAL | 2 refills | Status: DC | PRN

## 2016-01-15 NOTE — Patient Instructions (Signed)
Continue Metformin two tablets per day. Will consider adding another medication if needed.

## 2016-01-15 NOTE — Progress Notes (Signed)
Date:  01/15/2016   Name:  Kathy Dougherty   DOB:  January 23, 1951   MRN:  [id number]   Chief Complaint: Knee Pain (R and L knee pain worse at night X 3-4 weeks. ) Knee Pain   There was no injury mechanism. The pain is present in the right knee and left knee. The quality of the pain is described as aching. The pain is moderate. The pain has been worsening since onset. Pertinent negatives include no numbness. The symptoms are aggravated by weight bearing. She has tried acetaminophen and NSAIDs for the symptoms. The treatment provided mild relief.  Diabetes  She presents for her follow-up diabetic visit. She has type 2 diabetes mellitus. Her disease course has been stable. Pertinent negatives for hypoglycemia include no headaches or tremors. Pertinent negatives for diabetes include no chest pain, no fatigue, no polydipsia and no polyuria. Current diabetic treatment includes oral agent (monotherapy) (could not tolerate max dose metformin). She is compliant with treatment all of the time (taking metformin twice a day).  Hypertension  This is a chronic problem. The current episode started more than 1 year ago. The problem is unchanged. The problem is controlled. Pertinent negatives include no chest pain, headaches, palpitations or shortness of breath.      Review of Systems  Constitutional: Negative for appetite change, diaphoresis, fatigue, fever and unexpected weight change.  HENT: Negative for tinnitus and trouble swallowing.   Eyes: Negative for visual disturbance.  Respiratory: Negative for cough, choking, chest tightness and shortness of breath.   Cardiovascular: Negative for chest pain, palpitations and leg swelling.  Gastrointestinal: Negative for abdominal pain.  Endocrine: Negative for polydipsia and polyuria.  Genitourinary: Negative for dysuria and hematuria.  Musculoskeletal: Positive for arthralgias. Negative for gait problem, joint swelling and myalgias.  Neurological: Negative for  tremors, numbness and headaches.  Psychiatric/Behavioral: Negative for dysphoric mood.    Patient Active Problem List   Diagnosis Date Noted  . Controlled type 2 diabetes mellitus without complication ([name]) XX123456  . Abnormal LFTs 03/15/2015  . Essential (primary) hypertension 03/15/2015  . Gastro-esophageal reflux disease without esophagitis 03/15/2015  . Allergic rhinitis, seasonal 03/15/2015  . Infection of the upper respiratory tract 02/26/2011    Prior to Admission medications   Medication Sig Start Date End Date Taking? Authorizing Provider  amLODipine (NORVASC) 2.5 MG tablet TAKE 1 TABLET BY MOUTH EVERY DAY 09/12/15  Yes [name], MD  glucose blood (ONE TOUCH ULTRA TEST) test strip  05/23/14  Yes Historical Provider, MD  ibuprofen (ADVIL,MOTRIN) 600 MG tablet Take 1 tablet (600 mg total) by mouth every 8 (eight) hours as needed. 03/19/15  Yes [name], MD  levocetirizine (XYZAL) 5 MG tablet Take 1 tablet (5 mg total) by mouth every evening. 09/17/15  Yes [name], MD  lisinopril (PRINIVIL,ZESTRIL) 10 MG tablet Take 1 tablet (10 mg total) by mouth daily. 09/17/15  Yes [name], MD  metFORMIN (GLUCOPHAGE) 500 MG tablet Take 2 tablets (1,000 mg total) by mouth 2 (two) times daily. 09/19/15  Yes [name], MD  mometasone (NASONEX) 50 MCG/ACT nasal spray Place 2 sprays into the nose daily.   Yes Historical Provider, MD  omeprazole (PRILOSEC) 40 MG capsule Take 1 capsule (40 mg total) by mouth daily. 09/17/15  Yes [name], MD    Allergies  Allergen Reactions  . Penicillins Rash  . Sulfa Antibiotics Rash    Past Surgical History:  Procedure Laterality Date  .  CATARACT EXTRACTION  08/2014  . PARTIAL HYSTERECTOMY     fibroids    Social History  Substance Use Topics  . Smoking status: Never Smoker  . Smokeless tobacco: Never Used  . Alcohol use No     Medication list has been reviewed and updated.   Physical Exam  Constitutional:  She is oriented to person, place, and time. She appears well-developed. No distress.  HENT:  Head: Normocephalic and atraumatic.  Cardiovascular: Normal rate, regular rhythm and normal heart sounds.   Pulmonary/Chest: Effort normal and breath sounds normal. No respiratory distress.  Musculoskeletal:       Right knee: She exhibits decreased range of motion. She exhibits no swelling and no effusion. Tenderness found.       Left knee: She exhibits normal range of motion, no swelling and no effusion. No tenderness found.  Neurological: She is alert and oriented to person, place, and time.  Skin: Skin is warm and dry. No rash noted.  Psychiatric: She has a normal mood and affect. Her behavior is normal. Thought content normal.  Nursing note and vitals reviewed.   BP (!) 143/93 (BP Location: Right Arm, Patient Position: Sitting, Cuff Size: Normal)   Pulse 97   Resp 16   Ht 5\' 2"  (1.575 m)   Wt 174 lb (78.9 kg)   SpO2 (!) 77%   BMI 31.83 kg/m   Assessment and Plan: 1. Arthritis of right knee Begin ultram as needed; Xray right knee - traMADol (ULTRAM) 50 MG tablet; Take 1 tablet (50 mg total) by mouth every 8 (eight) hours as needed.  Dispense: 60 tablet; Refill: 2 - DG Knee Complete 4 Views Right; Future  2. Essential (primary) hypertension controlled  3. Controlled type 2 diabetes mellitus without complication, without long-term current use of insulin ([name]) Could not tolerate metformin at max dose - continue 500 mg bid - Hemoglobin A1c   [name], MD [facility]  01/15/2016

## 2016-01-16 LAB — HEMOGLOBIN A1C
Est. average glucose Bld gHb Est-mCnc: 157 mg/dL
Hgb A1c MFr Bld: 7.1 % — ABNORMAL HIGH (ref 4.8–5.6)

## 2016-03-20 ENCOUNTER — Ambulatory Visit (INDEPENDENT_AMBULATORY_CARE_PROVIDER_SITE_OTHER): Payer: Medicare Other | Admitting: Internal Medicine

## 2016-03-20 ENCOUNTER — Encounter: Payer: Self-pay | Admitting: Internal Medicine

## 2016-03-20 ENCOUNTER — Encounter (INDEPENDENT_AMBULATORY_CARE_PROVIDER_SITE_OTHER): Payer: Self-pay

## 2016-03-20 ENCOUNTER — Other Ambulatory Visit: Payer: Self-pay | Admitting: Internal Medicine

## 2016-03-20 VITALS — BP 162/86 | HR 81 | Resp 16 | Ht 62.0 in | Wt 170.0 lb

## 2016-03-20 DIAGNOSIS — I10 Essential (primary) hypertension: Secondary | ICD-10-CM

## 2016-03-20 DIAGNOSIS — Z0000 Encounter for general adult medical examination without abnormal findings: Secondary | ICD-10-CM

## 2016-03-20 DIAGNOSIS — K219 Gastro-esophageal reflux disease without esophagitis: Secondary | ICD-10-CM

## 2016-03-20 DIAGNOSIS — Z124 Encounter for screening for malignant neoplasm of cervix: Secondary | ICD-10-CM

## 2016-03-20 DIAGNOSIS — Z23 Encounter for immunization: Secondary | ICD-10-CM | POA: Diagnosis not present

## 2016-03-20 DIAGNOSIS — E119 Type 2 diabetes mellitus without complications: Secondary | ICD-10-CM

## 2016-03-20 DIAGNOSIS — J302 Other seasonal allergic rhinitis: Secondary | ICD-10-CM

## 2016-03-20 LAB — POCT URINALYSIS DIPSTICK
Bilirubin, UA: NEGATIVE
Blood, UA: NEGATIVE
Glucose, UA: NEGATIVE
Ketones, UA: NEGATIVE
Leukocytes, UA: NEGATIVE
Nitrite, UA: NEGATIVE
Protein, UA: NEGATIVE
Spec Grav, UA: 1.02
Urobilinogen, UA: 0.2
pH, UA: 6

## 2016-03-20 MED ORDER — LEVOCETIRIZINE DIHYDROCHLORIDE 5 MG PO TABS: 5.0000 mg | ORAL_TABLET | Freq: Every evening | ORAL | 5 refills | Status: DC

## 2016-03-20 MED ORDER — HYDROCODONE-HOMATROPINE 5-1.5 MG/5ML PO SYRP: 5.0000 mL | ORAL_SOLUTION | Freq: Four times a day (QID) | ORAL | 0 refills | Status: DC | PRN

## 2016-03-20 NOTE — Progress Notes (Signed)
Date:  03/20/2016   Name:  Kathy Dougherty   DOB:  1950-11-09   MRN:  [id number]   Chief Complaint: Annual Exam (pap and flu shot /pneumonia shot ) and Diabetes (Wants to change Metformin upsets stomach bad. ) Kathy Dougherty is a 65 y.o. female who presents today for her Complete Annual Exam. She feels well. She reports exercising regularly. She reports she is sleeping well. Mammogram done recently.  Hypertension  Pertinent negatives include no chest pain, headaches, palpitations or shortness of breath.  Diabetes  She presents for her follow-up diabetic visit. She has type 2 diabetes mellitus. Pertinent negatives for hypoglycemia include no dizziness, headaches, nervousness/anxiousness or tremors. Pertinent negatives for diabetes include no chest pain, no fatigue, no polydipsia and no polyuria. Current diabetic treatment includes diet (metformin causes diarrhea).  Gastroesophageal Reflux  She reports no abdominal pain, no chest pain, no coughing or no wheezing. Pertinent negatives include no fatigue.      Review of Systems  Constitutional: Negative for chills, fatigue and fever.  HENT: Negative for congestion, hearing loss, tinnitus, trouble swallowing and voice change.   Eyes: Negative for visual disturbance.  Respiratory: Negative for cough, chest tightness, shortness of breath and wheezing.   Cardiovascular: Negative for chest pain, palpitations and leg swelling.  Gastrointestinal: Negative for abdominal pain, constipation, diarrhea and vomiting.       Occasional indigestion  Endocrine: Negative for polydipsia and polyuria.  Genitourinary: Negative for dysuria, frequency, genital sores, vaginal bleeding and vaginal discharge.  Musculoskeletal: Positive for myalgias (left upper arm muscle). Negative for arthralgias, gait problem and joint swelling.  Skin: Negative for color change and rash.  Neurological: Negative for dizziness, tremors, light-headedness and headaches.    Hematological: Negative for adenopathy. Does not bruise/bleed easily.  Psychiatric/Behavioral: Negative for dysphoric mood and sleep disturbance. The patient is not nervous/anxious.     Patient Active Problem List   Diagnosis Date Noted  . Arthritis of right knee 01/15/2016  . Controlled type 2 diabetes mellitus without complication ([place]) XX123456  . Abnormal LFTs 03/15/2015  . Essential (primary) hypertension 03/15/2015  . Gastro-esophageal reflux disease without esophagitis 03/15/2015  . Allergic rhinitis, seasonal 03/15/2015    Prior to Admission medications   Medication Sig Start Date End Date Taking? Authorizing Provider  amLODipine (NORVASC) 2.5 MG tablet TAKE 1 TABLET BY MOUTH EVERY DAY 09/12/15  Yes [name], MD  glucose blood (ONE TOUCH ULTRA TEST) test strip  05/23/14  Yes Historical Provider, MD  ibuprofen (ADVIL,MOTRIN) 600 MG tablet Take 1 tablet (600 mg total) by mouth every 8 (eight) hours as needed. 03/19/15  Yes [name], MD  levocetirizine (XYZAL) 5 MG tablet Take 1 tablet (5 mg total) by mouth every evening. 09/17/15  Yes [name], MD  lisinopril (PRINIVIL,ZESTRIL) 10 MG tablet Take 1 tablet (10 mg total) by mouth daily. 09/17/15  Yes [name], MD  metFORMIN (GLUCOPHAGE) 500 MG tablet Take 2 tablets (1,000 mg total) by mouth 2 (two) times daily. Patient taking differently: Take 500 mg by mouth 2 (two) times daily with a meal.  09/19/15  Yes [name], MD  mometasone (NASONEX) 50 MCG/ACT nasal spray Place 2 sprays into the nose daily.   Yes Historical Provider, MD  omeprazole (PRILOSEC) 40 MG capsule Take 1 capsule (40 mg total) by mouth daily. 09/17/15  Yes [name], MD  traMADol (ULTRAM) 50 MG tablet Take 1 tablet (50 mg total) by mouth every 8 (eight)  hours as needed. 01/15/16  Yes [name], MD    Allergies  Allergen Reactions  . Penicillins Rash  . Sulfa Antibiotics Rash    Past Surgical History:  Procedure  Laterality Date  . CATARACT EXTRACTION  08/2014  . PARTIAL HYSTERECTOMY     fibroids    Social History  Substance Use Topics  . Smoking status: Never Smoker  . Smokeless tobacco: Never Used  . Alcohol use No     Medication list has been reviewed and updated.   Physical Exam  Constitutional: She is oriented to person, place, and time. She appears well-developed and well-nourished. No distress.  HENT:  Head: Normocephalic and atraumatic.  Right Ear: Tympanic membrane and ear canal normal.  Left Ear: Tympanic membrane and ear canal normal.  Nose: Right sinus exhibits no maxillary sinus tenderness. Left sinus exhibits no maxillary sinus tenderness.  Mouth/Throat: Uvula is midline and oropharynx is clear and moist.  Eyes: Conjunctivae and EOM are normal. Right eye exhibits no discharge. Left eye exhibits no discharge. No scleral icterus.  Neck: Normal range of motion. Carotid bruit is not present. No erythema present. No thyromegaly present.  Cardiovascular: Normal rate, regular rhythm, normal heart sounds and normal pulses.   Pulmonary/Chest: Effort normal. No respiratory distress. She has no wheezes. Right breast exhibits no mass, no nipple discharge, no skin change and no tenderness. Left breast exhibits no mass, no nipple discharge, no skin change and no tenderness.  Abdominal: Soft. Bowel sounds are normal. There is no hepatosplenomegaly. There is no tenderness. There is no CVA tenderness.  Genitourinary: Uterus normal. There is no tenderness, lesion or injury on the right labia. There is no lesion or injury on the left labia. Cervix exhibits no motion tenderness and no friability. Right adnexum displays no mass, no tenderness and no fullness. Left adnexum displays no mass. There is erythema (atrophic) in the vagina.  Musculoskeletal: Normal range of motion. She exhibits no edema or tenderness.       Arms: Lymphadenopathy:    She has no cervical adenopathy.    She has no axillary  adenopathy.  Neurological: She is alert and oriented to person, place, and time. She has normal reflexes. No cranial nerve deficit or sensory deficit.  Skin: Skin is warm, dry and intact. No rash noted.  Psychiatric: She has a normal mood and affect. Her speech is normal and behavior is normal. Thought content normal.  Nursing note and vitals reviewed.   BP (!) 162/86   Pulse 81   Resp 16   Ht 5\' 2"  (1.575 m)   Wt 170 lb (77.1 kg)   SpO2 99%   BMI 31.09 kg/m   Assessment and Plan: 1. Annual physical exam Will need to return for MAW - POCT urinalysis dipstick  2. Cervical cancer screening - Pap IG (Image Guided)  3. Essential (primary) hypertension controlled - CBC with Differential/Platelet  4. Gastro-esophageal reflux disease without esophagitis Stable on PPI  5. Controlled type 2 diabetes mellitus without complication, without long-term current use of insulin ([place]) Will check labs and advise on alternative medication to metformin - Comprehensive metabolic panel - Lipid panel - Microalbumin / creatinine urine ratio - TSH - Hemoglobin A1c  6. Chronic seasonal allergic rhinitis, unspecified trigger - levocetirizine (XYZAL) 5 MG tablet; Take 1 tablet (5 mg total) by mouth every evening.  Dispense: 30 tablet; Refill: 5  7. Need for influenza vaccination - Flu Vaccine QUAD 36+ mos IM  8. Need for pneumococcal  vaccination - Pneumococcal conjugate vaccine 13-valent IM   [name], MD [facility]  03/20/2016

## 2016-03-20 NOTE — Patient Instructions (Addendum)
Health Maintenance  Topic Date Due  . HIV Screening  02/09/1966  . DEXA SCAN  02/10/2016  . FOOT EXAM  03/18/2016  . TETANUS/TDAP  05/19/2016 (Originally 02/09/1970)  . HEMOGLOBIN A1C  07/16/2016  . OPHTHALMOLOGY EXAM  09/24/2016  . MAMMOGRAM  10/22/2016  . PNA vac Low Risk Adult (2 of 2 - PPSV23) 03/20/2017  . PAP SMEAR  03/21/2019  . COLONOSCOPY  05/20/2022  . INFLUENZA VACCINE  Completed  . ZOSTAVAX  Completed  . Hepatitis C Screening  Addressed   Pneumococcal Vaccine, Polyvalent suspension for injection What is this medicine? PNEUMOCOCCAL VACCINE (NEU mo KOK al vak SEEN) is a vaccine used to prevent pneumococcus bacterial infections. These bacteria can cause serious infections like pneumonia, meningitis, and blood infections. This vaccine will lower your chance of getting pneumonia. If you do get pneumonia, it can make your symptoms milder and your illness shorter. This vaccine will not treat an infection and will not cause infection. This vaccine is recommended for infants and young children, adults with certain medical conditions, and adults 17 years or older. This medicine may be used for other purposes; ask your health care provider or pharmacist if you have questions. What should I tell my health care provider before I take this medicine? They need to know if you have any of these conditions: -bleeding problems -fever -immune system problems -an unusual or allergic reaction to pneumococcal vaccine, diphtheria toxoid, other vaccines, latex, other medicines, foods, dyes, or preservatives -pregnant or trying to get pregnant -breast-feeding How should I use this medicine? This vaccine is for injection into a muscle. It is given by a health care professional. A copy of Vaccine Information Statements will be given before each vaccination. Read this sheet carefully each time. The sheet may change frequently. Talk to your pediatrician regarding the use of this medicine in children.  While this drug may be prescribed for children as young as 75 weeks old for selected conditions, precautions do apply. Overdosage: If you think you have taken too much of this medicine contact a poison control center or emergency room at once. NOTE: This medicine is only for you. Do not share this medicine with others. What if I miss a dose? It is important not to miss your dose. Call your doctor or health care professional if you are unable to keep an appointment. What may interact with this medicine? -medicines for cancer chemotherapy -medicines that suppress your immune function -steroid medicines like prednisone or cortisone This list may not describe all possible interactions. Give your health care provider a list of all the medicines, herbs, non-prescription drugs, or dietary supplements you use. Also tell them if you smoke, drink alcohol, or use illegal drugs. Some items may interact with your medicine. What should I watch for while using this medicine? Mild fever and pain should go away in 3 days or less. Report any unusual symptoms to your doctor or health care professional. What side effects may I notice from receiving this medicine? Side effects that you should report to your doctor or health care professional as soon as possible: -allergic reactions like skin rash, itching or hives, swelling of the face, lips, or tongue -breathing problems -confused -fast or irregular heartbeat -fever over 102 degrees F -seizures -unusual bleeding or bruising -unusual muscle weakness Side effects that usually do not require medical attention (report to your doctor or health care professional if they continue or are bothersome): -aches and pains -diarrhea -fever of 102 degrees F or  less -headache -irritable -loss of appetite -pain, tender at site where injected -trouble sleeping This list may not describe all possible side effects. Call your doctor for medical advice about side effects. You  may report side effects to FDA at 1-800-FDA-1088. Where should I keep my medicine? This does not apply. This vaccine is given in a clinic, pharmacy, doctor's office, or other health care setting and will not be stored at home. NOTE: This sheet is a summary. It may not cover all possible information. If you have questions about this medicine, talk to your doctor, pharmacist, or health care provider.    2016, Elsevier/Gold Standard. (2014-02-09 10:27:27)

## 2016-03-21 LAB — CBC WITH DIFFERENTIAL/PLATELET
Basophils Absolute: 0 10*3/uL (ref 0.0–0.2)
Basos: 1 %
EOS (ABSOLUTE): 0.2 10*3/uL (ref 0.0–0.4)
Eos: 3 %
Hematocrit: 38.4 % (ref 34.0–46.6)
Hemoglobin: 12.9 g/dL (ref 11.1–15.9)
Immature Grans (Abs): 0 10*3/uL (ref 0.0–0.1)
Immature Granulocytes: 0 %
Lymphocytes Absolute: 1.9 10*3/uL (ref 0.7–3.1)
Lymphs: 41 %
MCH: 28 pg (ref 26.6–33.0)
MCHC: 33.6 g/dL (ref 31.5–35.7)
MCV: 84 fL (ref 79–97)
Monocytes Absolute: 0.4 10*3/uL (ref 0.1–0.9)
Monocytes: 10 %
Neutrophils Absolute: 2 10*3/uL (ref 1.4–7.0)
Neutrophils: 45 %
Platelets: 204 10*3/uL (ref 150–379)
RBC: 4.6 x10E6/uL (ref 3.77–5.28)
RDW: 13.7 % (ref 12.3–15.4)
WBC: 4.5 10*3/uL (ref 3.4–10.8)

## 2016-03-21 LAB — COMPREHENSIVE METABOLIC PANEL
ALT: 20 IU/L (ref 0–32)
AST: 22 IU/L (ref 0–40)
Albumin/Globulin Ratio: 1.3 (ref 1.2–2.2)
Albumin: 4.3 g/dL (ref 3.6–4.8)
Alkaline Phosphatase: 93 IU/L (ref 39–117)
BUN/Creatinine Ratio: 14 (ref 12–28)
BUN: 9 mg/dL (ref 8–27)
Bilirubin Total: 0.5 mg/dL (ref 0.0–1.2)
CO2: 27 mmol/L (ref 18–29)
Calcium: 9.3 mg/dL (ref 8.7–10.3)
Chloride: 101 mmol/L (ref 96–106)
Creatinine, Ser: 0.65 mg/dL (ref 0.57–1.00)
GFR calc Af Amer: 108 mL/min/{1.73_m2} (ref >59)
GFR calc non Af Amer: 93 mL/min/{1.73_m2} (ref >59)
Globulin, Total: 3.3 g/dL (ref 1.5–4.5)
Glucose: 132 mg/dL — ABNORMAL HIGH (ref 65–99)
Potassium: 4.2 mmol/L (ref 3.5–5.2)
Sodium: 143 mmol/L (ref 134–144)
Total Protein: 7.6 g/dL (ref 6.0–8.5)

## 2016-03-21 LAB — HEMOGLOBIN A1C
Est. average glucose Bld gHb Est-mCnc: 157 mg/dL
Hgb A1c MFr Bld: 7.1 % — ABNORMAL HIGH (ref 4.8–5.6)

## 2016-03-21 LAB — LIPID PANEL
Chol/HDL Ratio: 3.4 ratio units (ref 0.0–4.4)
Cholesterol, Total: 141 mg/dL (ref 100–199)
HDL: 42 mg/dL (ref >39)
LDL Calculated: 87 mg/dL (ref 0–99)
Triglycerides: 61 mg/dL (ref 0–149)
VLDL Cholesterol Cal: 12 mg/dL (ref 5–40)

## 2016-03-21 LAB — TSH: TSH: 1.21 u[IU]/mL (ref 0.450–4.500)

## 2016-03-21 LAB — MICROALBUMIN / CREATININE URINE RATIO
Creatinine, Urine: 104.8 mg/dL
Microalb/Creat Ratio: 9 mg/g creat (ref 0.0–30.0)
Microalbumin, Urine: 9.4 ug/mL

## 2016-03-25 LAB — PAP IG (IMAGE GUIDED): PAP Smear Comment: 0

## 2016-03-26 ENCOUNTER — Other Ambulatory Visit: Payer: Self-pay | Admitting: Internal Medicine

## 2016-03-26 MED ORDER — GLIMEPIRIDE 2 MG PO TABS: 2.0000 mg | ORAL_TABLET | Freq: Every day | ORAL | 3 refills | Status: DC

## 2016-03-28 DIAGNOSIS — H2512 Age-related nuclear cataract, left eye: Secondary | ICD-10-CM | POA: Diagnosis not present

## 2016-04-02 ENCOUNTER — Encounter: Payer: Self-pay | Admitting: Internal Medicine

## 2016-04-02 ENCOUNTER — Ambulatory Visit (INDEPENDENT_AMBULATORY_CARE_PROVIDER_SITE_OTHER): Payer: Medicare Other | Admitting: Internal Medicine

## 2016-04-02 VITALS — BP 140/82 | HR 78 | Resp 16 | Ht 62.0 in | Wt 172.6 lb

## 2016-04-02 DIAGNOSIS — R21 Rash and other nonspecific skin eruption: Secondary | ICD-10-CM

## 2016-04-02 MED ORDER — TRIAMCINOLONE ACETONIDE 0.1 % EX CREA: 1.0000 "application " | TOPICAL_CREAM | Freq: Two times a day (BID) | CUTANEOUS | 0 refills | Status: DC

## 2016-04-02 NOTE — Progress Notes (Signed)
Date:  04/02/2016   Name:  Kathy Dougherty   DOB:  May 07, 1951   MRN:  [id number]   Chief Complaint: Rash (Started 4 days after pneummonia shot. Also had a few days of feeling off blance and dizzy. ) Rash  This is a new problem. The current episode started in the past 7 days. The affected locations include the neck. She was exposed to nothing. Pertinent negatives include no fatigue, fever or shortness of breath. Past treatments include moisturizer. The treatment provided no relief.      Review of Systems  Constitutional: Negative for fatigue and fever.  Respiratory: Negative for chest tightness and shortness of breath.   Cardiovascular: Negative for chest pain.  Musculoskeletal: Negative for arthralgias and myalgias.  Skin: Positive for rash (localized).    Patient Active Problem List   Diagnosis Date Noted  . Arthritis of right knee 01/15/2016  . Controlled type 2 diabetes mellitus without complication ([place]) XX123456  . Abnormal LFTs 03/15/2015  . Essential (primary) hypertension 03/15/2015  . Gastro-esophageal reflux disease without esophagitis 03/15/2015  . Allergic rhinitis, seasonal 03/15/2015    Prior to Admission medications   Medication Sig Start Date End Date Taking? Authorizing Provider  amLODipine (NORVASC) 2.5 MG tablet TAKE 1 TABLET BY MOUTH EVERY DAY 09/12/15  Yes [name], MD  glimepiride (AMARYL) 2 MG tablet Take 1 tablet (2 mg total) by mouth daily before breakfast. 03/26/16  Yes [name], MD  glucose blood (ONE TOUCH ULTRA TEST) test strip  05/23/14  Yes Historical Provider, MD  ibuprofen (ADVIL,MOTRIN) 600 MG tablet Take 1 tablet (600 mg total) by mouth every 8 (eight) hours as needed. 03/19/15  Yes [name], MD  levocetirizine (XYZAL) 5 MG tablet Take 1 tablet (5 mg total) by mouth every evening. 03/20/16  Yes [name], MD  lisinopril (PRINIVIL,ZESTRIL) 10 MG tablet Take 1 tablet (10 mg total) by mouth daily. 09/17/15  Yes [name], MD  mometasone (NASONEX) 50 MCG/ACT nasal spray Place 2 sprays into the nose daily.   Yes Historical Provider, MD  omeprazole (PRILOSEC) 40 MG capsule Take 1 capsule (40 mg total) by mouth daily. 09/17/15  Yes [name], MD  traMADol (ULTRAM) 50 MG tablet Take 1 tablet (50 mg total) by mouth every 8 (eight) hours as needed. Patient not taking: Reported on 04/02/2016 01/15/16   [name], MD    Allergies  Allergen Reactions  . Metformin And Related Diarrhea  . Penicillins Rash  . Sulfa Antibiotics Rash    Past Surgical History:  Procedure Laterality Date  . CATARACT EXTRACTION  08/2014  . PARTIAL HYSTERECTOMY     fibroids    Social History  Substance Use Topics  . Smoking status: Never Smoker  . Smokeless tobacco: Never Used  . Alcohol use No     Medication list has been reviewed and updated.   Physical Exam  Constitutional: She is oriented to person, place, and time. She appears well-developed. No distress.  HENT:  Head: Normocephalic and atraumatic.  Pulmonary/Chest: Effort normal. No respiratory distress.  Musculoskeletal: Normal range of motion.  Neurological: She is alert and oriented to person, place, and time.  Skin: Skin is warm and dry. No rash noted.  4 cm patch of dry papules on right side of neck.  No other rash noted on chest, back, arms or abdomen.  Psychiatric: She has a normal mood and affect. Her behavior is normal. Thought content normal.  Nursing note and vitals reviewed.   BP 140/82   Pulse 78   Resp 16   Ht 5\' 2"  (1.575 m)   Wt 172 lb 9.6 oz (78.3 kg)   SpO2 98%   BMI 31.57 kg/m   Assessment and Plan: 1. Rash and nonspecific skin eruption Follow up or call if worsening (just started glimepiride and has a sulfa allergy) - triamcinolone cream (KENALOG) 0.1 %; Apply 1 application topically 2 (two) times daily.  Dispense: 30 g; Refill: 0   [name], MD [facility]  04/02/2016

## 2016-04-15 ENCOUNTER — Ambulatory Visit
Admission: EM | Admit: 2016-04-15 | Discharge: 2016-04-15 | Discharge status: Absent without leave | Payer: BLUE CROSS/BLUE SHIELD

## 2016-04-15 NOTE — ED Triage Notes (Signed)
Pt got up out of bed yesterday morning and fell and hit her head on the desk. Has had some vision changes but no headaches. C/o stiff neck and shoulder on left side.

## 2016-04-18 DIAGNOSIS — H2512 Age-related nuclear cataract, left eye: Secondary | ICD-10-CM | POA: Diagnosis not present

## 2016-04-18 HISTORY — PX: CATARACT EXTRACTION: SUR2

## 2016-04-23 DIAGNOSIS — Z7984 Long term (current) use of oral hypoglycemic drugs: Secondary | ICD-10-CM | POA: Diagnosis not present

## 2016-04-23 DIAGNOSIS — H2512 Age-related nuclear cataract, left eye: Secondary | ICD-10-CM | POA: Diagnosis not present

## 2016-05-06 DIAGNOSIS — Z4889 Encounter for other specified surgical aftercare: Secondary | ICD-10-CM | POA: Diagnosis not present

## 2016-05-14 ENCOUNTER — Encounter: Payer: Self-pay | Admitting: Internal Medicine

## 2016-05-14 ENCOUNTER — Ambulatory Visit (INDEPENDENT_AMBULATORY_CARE_PROVIDER_SITE_OTHER): Payer: Medicare Other | Admitting: Internal Medicine

## 2016-05-14 VITALS — BP 152/98 | HR 77 | Temp 98.2°F | Ht 62.0 in | Wt 174.0 lb

## 2016-05-14 DIAGNOSIS — H1033 Unspecified acute conjunctivitis, bilateral: Secondary | ICD-10-CM | POA: Diagnosis not present

## 2016-05-14 DIAGNOSIS — J40 Bronchitis, not specified as acute or chronic: Secondary | ICD-10-CM | POA: Diagnosis not present

## 2016-05-14 MED ORDER — HYDROCODONE-HOMATROPINE 5-1.5 MG/5ML PO SYRP: 5.0000 mL | ORAL_SOLUTION | Freq: Four times a day (QID) | ORAL | 0 refills | Status: DC | PRN

## 2016-05-14 MED ORDER — NEOMYCIN-POLYMYXIN-HC 3.5-10000-1 OP SUSP: 3.0000 [drp] | Freq: Four times a day (QID) | OPHTHALMIC | 0 refills | Status: DC

## 2016-05-14 MED ORDER — LEVOFLOXACIN 500 MG PO TABS: 500.0000 mg | ORAL_TABLET | Freq: Every day | ORAL | 0 refills | Status: DC

## 2016-05-14 NOTE — Progress Notes (Addendum)
Date:  05/14/2016   Name:  Kathy Dougherty   DOB:  04/19/1951   MRN:  [id number]   Chief Complaint: Sore Throat (pt stated sore throat, eye redness, body aching for 5 days) Sore Throat   This is a new problem. The current episode started in the past 7 days. The problem has been gradually worsening. The maximum temperature recorded prior to her arrival was 100.4 - 100.9 F. Associated symptoms include congestion, coughing, a hoarse voice and shortness of breath. Pertinent negatives include no abdominal pain, diarrhea, trouble swallowing or vomiting. Associated symptoms comments: Cough productive of green phlegm. She has tried acetaminophen and NSAIDs for the symptoms.      Review of Systems  Constitutional: Positive for fatigue and fever. Negative for chills.  HENT: Positive for congestion, hoarse voice, sinus pressure, sore throat and voice change. Negative for sinus pain and trouble swallowing.   Eyes: Positive for redness and visual disturbance.  Respiratory: Positive for cough and shortness of breath. Negative for chest tightness and wheezing.   Cardiovascular: Negative for chest pain.  Gastrointestinal: Negative for abdominal pain, diarrhea and vomiting.    Patient Active Problem List   Diagnosis Date Noted  . Arthritis of right knee 01/15/2016  . Controlled type 2 diabetes mellitus without complication ([place]) XX123456  . Abnormal LFTs 03/15/2015  . Essential (primary) hypertension 03/15/2015  . Gastro-esophageal reflux disease without esophagitis 03/15/2015  . Allergic rhinitis, seasonal 03/15/2015    Prior to Admission medications   Medication Sig Start Date End Date Taking? Authorizing Provider  amLODipine (NORVASC) 2.5 MG tablet TAKE 1 TABLET BY MOUTH EVERY DAY 09/12/15  Yes [name], MD  glimepiride (AMARYL) 2 MG tablet Take 1 tablet (2 mg total) by mouth daily before breakfast. 03/26/16  Yes [name], MD  glucose blood (ONE TOUCH ULTRA TEST) test strip   05/23/14  Yes Historical Provider, MD  ibuprofen (ADVIL,MOTRIN) 600 MG tablet Take 1 tablet (600 mg total) by mouth every 8 (eight) hours as needed. 03/19/15  Yes [name], MD  levocetirizine (XYZAL) 5 MG tablet Take 1 tablet (5 mg total) by mouth every evening. 03/20/16  Yes [name], MD  lisinopril (PRINIVIL,ZESTRIL) 10 MG tablet Take 1 tablet (10 mg total) by mouth daily. 09/17/15  Yes [name], MD  omeprazole (PRILOSEC) 40 MG capsule Take 1 capsule (40 mg total) by mouth daily. 09/17/15  Yes [name], MD  traMADol (ULTRAM) 50 MG tablet Take 1 tablet (50 mg total) by mouth every 8 (eight) hours as needed. 01/15/16  Yes [name], MD    Allergies  Allergen Reactions  . Metformin And Related Diarrhea  . Penicillins Rash  . Sulfa Antibiotics Rash    Past Surgical History:  Procedure Laterality Date  . CATARACT EXTRACTION  08/2014  . PARTIAL HYSTERECTOMY     fibroids    Social History  Substance Use Topics  . Smoking status: Never Smoker  . Smokeless tobacco: Never Used  . Alcohol use No     Medication list has been reviewed and updated.   Physical Exam  Constitutional: She is oriented to person, place, and time. She appears well-developed. No distress.  HENT:  Head: Normocephalic and atraumatic.  Right Ear: Tympanic membrane and ear canal normal.  Left Ear: Tympanic membrane and ear canal normal.  Nose: Right sinus exhibits no maxillary sinus tenderness. Left sinus exhibits no maxillary sinus tenderness.  Mouth/Throat: Posterior oropharyngeal erythema present. No  oropharyngeal exudate or posterior oropharyngeal edema.  Eyes: Right eye exhibits chemosis. Left eye exhibits chemosis. Right conjunctiva is injected. Right conjunctiva has no hemorrhage. Left conjunctiva is injected. Left conjunctiva has no hemorrhage.  Cardiovascular: Normal rate, regular rhythm and normal heart sounds.   Pulmonary/Chest: Effort normal. No respiratory distress. She  has decreased breath sounds. She has wheezes in the right upper field. She has no rhonchi. She has no rales.  Musculoskeletal: Normal range of motion.  Neurological: She is alert and oriented to person, place, and time.  Skin: Skin is warm and dry. No rash noted.  Psychiatric: She has a normal mood and affect. Her speech is normal and behavior is normal. Thought content normal.  Nursing note and vitals reviewed.   BP (!) 152/98   Pulse 77   Temp 98.2 F (36.8 C)   Ht 5\' 2"  (1.575 m)   Wt 174 lb (78.9 kg)   SpO2 97%   BMI 31.83 kg/m   Assessment and Plan: 1. Bronchitis Continue fluids - levofloxacin (LEVAQUIN) 500 MG tablet; Take 1 tablet (500 mg total) by mouth daily.  Dispense: 7 tablet; Refill: 0 - HYDROcodone-homatropine (HYCODAN) 5-1.5 MG/5ML syrup; Take 5 mLs by mouth every 6 (six) hours as needed for cough.  Dispense: 120 mL; Refill: 0  2. Acute conjunctivitis of both eyes, unspecified acute conjunctivitis type Consult Ophthalmology if no improvement - neomycin-polymyxin-hydrocortisone (CORTISPORIN) 3.5-10000-1 ophthalmic suspension; Place 3 drops into both eyes 4 (four) times daily.  Dispense: 7.5 mL; Refill: 0   [name], MD [facility]  05/14/2016

## 2016-06-18 DIAGNOSIS — H20012 Primary iridocyclitis, left eye: Secondary | ICD-10-CM | POA: Diagnosis not present

## 2016-06-20 DIAGNOSIS — H20012 Primary iridocyclitis, left eye: Secondary | ICD-10-CM | POA: Diagnosis not present

## 2016-06-26 DIAGNOSIS — H20012 Primary iridocyclitis, left eye: Secondary | ICD-10-CM | POA: Diagnosis not present

## 2016-06-29 ENCOUNTER — Other Ambulatory Visit: Payer: Self-pay | Admitting: Internal Medicine

## 2016-07-03 DIAGNOSIS — H20012 Primary iridocyclitis, left eye: Secondary | ICD-10-CM | POA: Diagnosis not present

## 2016-07-10 DIAGNOSIS — H20012 Primary iridocyclitis, left eye: Secondary | ICD-10-CM | POA: Diagnosis not present

## 2016-07-14 ENCOUNTER — Other Ambulatory Visit: Payer: Self-pay | Admitting: Internal Medicine

## 2016-07-14 DIAGNOSIS — H20012 Primary iridocyclitis, left eye: Secondary | ICD-10-CM | POA: Diagnosis not present

## 2016-07-14 DIAGNOSIS — J302 Other seasonal allergic rhinitis: Secondary | ICD-10-CM

## 2016-07-18 ENCOUNTER — Encounter: Payer: Self-pay | Admitting: Internal Medicine

## 2016-07-18 ENCOUNTER — Ambulatory Visit (INDEPENDENT_AMBULATORY_CARE_PROVIDER_SITE_OTHER): Payer: Medicare HMO | Admitting: Internal Medicine

## 2016-07-18 VITALS — BP 124/78 | HR 74 | Ht 62.0 in | Wt 178.4 lb

## 2016-07-18 DIAGNOSIS — M1711 Unilateral primary osteoarthritis, right knee: Secondary | ICD-10-CM

## 2016-07-18 DIAGNOSIS — E119 Type 2 diabetes mellitus without complications: Secondary | ICD-10-CM | POA: Diagnosis not present

## 2016-07-18 DIAGNOSIS — I10 Essential (primary) hypertension: Secondary | ICD-10-CM | POA: Diagnosis not present

## 2016-07-18 DIAGNOSIS — K219 Gastro-esophageal reflux disease without esophagitis: Secondary | ICD-10-CM

## 2016-07-18 MED ORDER — AMLODIPINE BESYLATE 2.5 MG PO TABS: 2.5000 mg | ORAL_TABLET | Freq: Every day | ORAL | 5 refills | Status: DC

## 2016-07-18 MED ORDER — LISINOPRIL 10 MG PO TABS: 10.0000 mg | ORAL_TABLET | Freq: Every day | ORAL | 5 refills | Status: DC

## 2016-07-18 NOTE — Progress Notes (Signed)
Date:  07/18/2016   Name:  Kathy Dougherty   DOB:  08-02-1950   MRN:  [id number]   Chief Complaint: Hypertension and Diabetes (BS was 140's last check (unknown).) Hypertension  This is a chronic problem. The current episode started more than 1 year ago. The problem is unchanged. The problem is controlled. Pertinent negatives include no chest pain, headaches, palpitations or shortness of breath. There are no associated agents to hypertension. Past treatments include ACE inhibitors and calcium channel blockers.  Diabetes  She presents for her follow-up diabetic visit. She has type 2 diabetes mellitus. Her disease course has been stable. Pertinent negatives for hypoglycemia include no headaches or tremors. Pertinent negatives for diabetes include no chest pain, no fatigue, no polydipsia and no polyuria.  Knee Pain   There was no injury mechanism. The pain is present in the right knee. The quality of the pain is described as aching. The pain is at a severity of 1/10. The pain is mild. The pain has been fluctuating since onset. Pertinent negatives include no loss of motion, muscle weakness or numbness. She has tried NSAIDs for the symptoms. The treatment provided significant (aleve helps but causes some stomach upset) relief.    Lab Results  Component Value Date   HGBA1C 7.1 (H) 03/20/2016     Review of Systems  Constitutional: Negative for appetite change, fatigue, fever and unexpected weight change.  HENT: Negative for tinnitus and trouble swallowing.   Eyes: Negative for visual disturbance.  Respiratory: Negative for cough, chest tightness and shortness of breath.   Cardiovascular: Negative for chest pain, palpitations and leg swelling.  Gastrointestinal: Negative for abdominal pain.  Endocrine: Negative for polydipsia and polyuria.  Genitourinary: Negative for dysuria and hematuria.  Musculoskeletal: Negative for arthralgias.  Neurological: Negative for tremors, numbness and headaches.    Psychiatric/Behavioral: Negative for dysphoric mood.    Patient Active Problem List   Diagnosis Date Noted  . Arthritis of right knee 01/15/2016  . Controlled type 2 diabetes mellitus without complication ([place]) XX123456  . Abnormal LFTs 03/15/2015  . Essential (primary) hypertension 03/15/2015  . Gastro-esophageal reflux disease without esophagitis 03/15/2015  . Allergic rhinitis, seasonal 03/15/2015    Prior to Admission medications   Medication Sig Start Date End Date Taking? Authorizing Provider  amLODipine (NORVASC) 2.5 MG tablet TAKE 1 TABLET BY MOUTH EVERY DAY 09/12/15  Yes [name], MD  glimepiride (AMARYL) 2 MG tablet TAKE 1 TABLET (2 MG TOTAL) BY MOUTH DAILY BEFORE BREAKFAST. 06/29/16  Yes [name], MD  glucose blood (ONE TOUCH ULTRA TEST) test strip  05/23/14  Yes Historical Provider, MD  ibuprofen (ADVIL,MOTRIN) 600 MG tablet Take 1 tablet (600 mg total) by mouth every 8 (eight) hours as needed. 03/19/15  Yes [name], MD  levocetirizine (XYZAL) 5 MG tablet TAKE 1 TABLET (5 MG TOTAL) BY MOUTH EVERY EVENING. 07/14/16  Yes [name], MD  lisinopril (PRINIVIL,ZESTRIL) 10 MG tablet Take 1 tablet (10 mg total) by mouth daily. 09/17/15  Yes [name], MD  omeprazole (PRILOSEC) 40 MG capsule Take 1 capsule (40 mg total) by mouth daily. 09/17/15  Yes [name], MD  traMADol (ULTRAM) 50 MG tablet Take 1 tablet (50 mg total) by mouth every 8 (eight) hours as needed. 01/15/16  Yes [name], MD    Allergies  Allergen Reactions  . Metformin And Related Diarrhea  . Penicillins Rash  . Sulfa Antibiotics Rash    Past  Surgical History:  Procedure Laterality Date  . CATARACT EXTRACTION  08/2014  . PARTIAL HYSTERECTOMY     fibroids    Social History  Substance Use Topics  . Smoking status: Never Smoker  . Smokeless tobacco: Never Used  . Alcohol use No     Medication list has been reviewed and updated.   Physical Exam   Constitutional: She is oriented to person, place, and time. She appears well-developed. No distress.  HENT:  Head: Normocephalic and atraumatic.  Neck: Normal range of motion. Neck supple. Carotid bruit is not present.  Cardiovascular: Normal rate, regular rhythm and normal heart sounds.   Pulmonary/Chest: Effort normal. No respiratory distress. She has no wheezes.  Abdominal: Soft.  Musculoskeletal: Normal range of motion. She exhibits no edema or tenderness.       Right knee: Normal.       Left knee: Normal.  Neurological: She is alert and oriented to person, place, and time.  Skin: Skin is warm and dry. No rash noted.  Psychiatric: She has a normal mood and affect. Her behavior is normal. Thought content normal.  Nursing note and vitals reviewed.   BP 124/78   Pulse 74   Ht 5\' 2"  (1.575 m)   Wt 178 lb 6.4 oz (80.9 kg)   SpO2 98%   BMI 32.63 kg/m   Assessment and Plan: 1. Essential (primary) hypertension controlled - lisinopril (PRINIVIL,ZESTRIL) 10 MG tablet; Take 1 tablet (10 mg total) by mouth daily.  Dispense: 30 tablet; Refill: 5  2. Gastro-esophageal reflux disease without esophagitis stable  3. Controlled type 2 diabetes mellitus without complication, without long-term current use of insulin ([place]) May need to adjust medication - Hemoglobin A1c  4. Arthritis of right knee Continue Aleve PRN   Meds ordered this encounter  Medications  . amLODipine (NORVASC) 2.5 MG tablet    Sig: Take 1 tablet (2.5 mg total) by mouth daily.    Dispense:  30 tablet    Refill:  5  . lisinopril (PRINIVIL,ZESTRIL) 10 MG tablet    Sig: Take 1 tablet (10 mg total) by mouth daily.    Dispense:  30 tablet    Refill:  [place], MD [facility]  07/18/2016

## 2016-07-19 LAB — HEMOGLOBIN A1C
Est. average glucose Bld gHb Est-mCnc: 140 mg/dL
Hgb A1c MFr Bld: 6.5 % — ABNORMAL HIGH (ref 4.8–5.6)

## 2016-07-31 DIAGNOSIS — H20012 Primary iridocyclitis, left eye: Secondary | ICD-10-CM | POA: Diagnosis not present

## 2016-09-01 DIAGNOSIS — H20022 Recurrent acute iridocyclitis, left eye: Secondary | ICD-10-CM | POA: Diagnosis not present

## 2016-09-03 ENCOUNTER — Other Ambulatory Visit: Payer: Self-pay

## 2016-09-03 MED ORDER — GLIMEPIRIDE 2 MG PO TABS: 2.0000 mg | ORAL_TABLET | Freq: Every day | ORAL | 12 refills | Status: DC

## 2016-09-10 DIAGNOSIS — H20012 Primary iridocyclitis, left eye: Secondary | ICD-10-CM | POA: Diagnosis not present

## 2016-09-14 ENCOUNTER — Other Ambulatory Visit: Payer: Self-pay | Admitting: Internal Medicine

## 2016-10-15 DIAGNOSIS — H20012 Primary iridocyclitis, left eye: Secondary | ICD-10-CM | POA: Diagnosis not present

## 2016-10-26 ENCOUNTER — Other Ambulatory Visit: Payer: Self-pay | Admitting: Internal Medicine

## 2016-10-26 DIAGNOSIS — K219 Gastro-esophageal reflux disease without esophagitis: Secondary | ICD-10-CM

## 2016-11-12 DIAGNOSIS — H20022 Recurrent acute iridocyclitis, left eye: Secondary | ICD-10-CM | POA: Diagnosis not present

## 2016-11-17 ENCOUNTER — Ambulatory Visit (INDEPENDENT_AMBULATORY_CARE_PROVIDER_SITE_OTHER): Payer: Medicare HMO | Admitting: Internal Medicine

## 2016-11-17 ENCOUNTER — Encounter: Payer: Self-pay | Admitting: Internal Medicine

## 2016-11-17 VITALS — BP 136/86 | HR 75 | Ht 62.0 in | Wt 172.0 lb

## 2016-11-17 DIAGNOSIS — E119 Type 2 diabetes mellitus without complications: Secondary | ICD-10-CM

## 2016-11-17 DIAGNOSIS — K219 Gastro-esophageal reflux disease without esophagitis: Secondary | ICD-10-CM

## 2016-11-17 DIAGNOSIS — I10 Essential (primary) hypertension: Secondary | ICD-10-CM

## 2016-11-17 DIAGNOSIS — Z1231 Encounter for screening mammogram for malignant neoplasm of breast: Secondary | ICD-10-CM | POA: Diagnosis not present

## 2016-11-17 MED ORDER — SIMVASTATIN 10 MG PO TABS: 10.0000 mg | ORAL_TABLET | Freq: Every day | ORAL | 5 refills | Status: DC

## 2016-11-17 NOTE — Progress Notes (Signed)
Date:  11/17/2016   Name:  Kathy Dougherty   DOB:  Jan 26, 1951   MRN:  [id number]   Chief Complaint: Diabetes (Fasting for labs. ) Diabetes  She presents for her follow-up diabetic visit. She has type 2 diabetes mellitus. Her disease course has been stable. Pertinent negatives for hypoglycemia include no headaches or tremors. Pertinent negatives for diabetes include no chest pain, no fatigue, no polydipsia and no polyuria. Current diabetic treatment includes oral agent (monotherapy) (glimepiride). Her weight is stable. She is following a generally healthy diet. She monitors blood glucose at home 1-2 x per week. Her breakfast blood glucose is taken between 6-7 am. Her breakfast blood glucose range is generally 110-130 mg/dl. An ACE inhibitor/angiotensin II receptor blocker is being taken. Eye exam is current.  Hypertension  This is a chronic problem. The problem is controlled. Pertinent negatives include no chest pain, headaches, palpitations or shortness of breath. Past treatments include ACE inhibitors.    Lab Results  Component Value Date   HGBA1C 6.5 (H) 07/18/2016     Review of Systems  Constitutional: Negative for appetite change, fatigue, fever and unexpected weight change.  HENT: Negative for tinnitus and trouble swallowing.   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  Respiratory: Negative for cough, chest tightness and shortness of breath.   Cardiovascular: Negative for chest pain, palpitations and leg swelling.  Gastrointestinal: Negative for abdominal pain.  Endocrine: Negative for polydipsia and polyuria.  Genitourinary: Negative for dysuria and hematuria.  Musculoskeletal: Negative for arthralgias.  Neurological: Negative for tremors, numbness and headaches.  Psychiatric/Behavioral: Negative for dysphoric mood.    Patient Active Problem List   Diagnosis Date Noted  . Arthritis of right knee 01/15/2016  . Controlled type 2  diabetes mellitus without complication, without long-term current use of insulin ([place]) 03/15/2015  . Abnormal LFTs 03/15/2015  . Essential (primary) hypertension 03/15/2015  . Gastro-esophageal reflux disease without esophagitis 03/15/2015  . Allergic rhinitis, seasonal 03/15/2015    Prior to Admission medications   Medication Sig Start Date End Date Taking? Authorizing Provider  amLODipine (NORVASC) 2.5 MG tablet TAKE 1 TABLET BY MOUTH EVERY DAY 09/14/16  Yes [name], MD  glimepiride (AMARYL) 2 MG tablet Take 1 tablet (2 mg total) by mouth daily before breakfast. 09/03/16  Yes [name], MD  glucose blood (ONE TOUCH ULTRA TEST) test strip  05/23/14  Yes [provider]  ibuprofen (ADVIL,MOTRIN) 600 MG tablet Take 1 tablet (600 mg total) by mouth every 8 (eight) hours as needed. 03/19/15  Yes [name], MD  levocetirizine (XYZAL) 5 MG tablet TAKE 1 TABLET (5 MG TOTAL) BY MOUTH EVERY EVENING. 07/14/16  Yes [name], MD  lisinopril (PRINIVIL,ZESTRIL) 10 MG tablet Take 1 tablet (10 mg total) by mouth daily. 07/18/16  Yes [name], MD  omeprazole (PRILOSEC) 40 MG capsule TAKE 1 CAPSULE (40 MG TOTAL) BY MOUTH DAILY. 10/27/16  Yes [name], MD  traMADol (ULTRAM) 50 MG tablet Take 1 tablet (50 mg total) by mouth every 8 (eight) hours as needed. 01/15/16  Yes [name], MD    Allergies  Allergen Reactions  . Metformin And Related Diarrhea  . Penicillins Rash  . Sulfa Antibiotics Rash    Past Surgical History:  Procedure Laterality Date  . CATARACT EXTRACTION Right 08/2014  . CATARACT EXTRACTION Left 04/2016  . PARTIAL HYSTERECTOMY     fibroids    Social History  Substance Use Topics  . Smoking status: Never Smoker  . Smokeless tobacco: Never Used  . Alcohol use No     Medication list has been reviewed and updated.   Physical Exam  Constitutional: She is oriented to person, place, and time. She appears well-developed.  No distress.  HENT:  Head: Normocephalic and atraumatic.  Neck: Carotid bruit is not present.  Cardiovascular: Normal rate, regular rhythm and normal heart sounds.   Pulmonary/Chest: Effort normal. No respiratory distress. She has no wheezes.  Musculoskeletal: Normal range of motion.  Neurological: She is alert and oriented to person, place, and time.  Skin: Skin is warm and dry. No rash noted.  Psychiatric: She has a normal mood and affect. Her speech is normal and behavior is normal. Thought content normal.  Nursing note and vitals reviewed.   BP 136/86   Pulse 75   Ht 5\' 2"  (1.575 m)   Wt 172 lb (78 kg)   SpO2 99%   BMI 31.46 kg/m   Assessment and Plan: 1. Controlled type 2 diabetes mellitus without complication, without long-term current use of insulin ([facility]) Continue glimepiride Add statin low dose - Hemoglobin Z1Y - Basic metabolic panel - simvastatin (ZOCOR) 10 MG tablet; Take 1 tablet (10 mg total) by mouth at bedtime.  Dispense: 30 tablet; Refill: 5  2. Essential (primary) hypertension controlled  3. Gastro-esophageal reflux disease without esophagitis Stable on PPI  4. Encounter for screening mammogram for breast cancer - MM DIGITAL SCREENING BILATERAL   Meds ordered this encounter  Medications  . simvastatin (ZOCOR) 10 MG tablet    Sig: Take 1 tablet (10 mg total) by mouth at bedtime.    Dispense:  30 tablet    Refill:  [place], MD [facility]  11/17/2016

## 2016-11-18 LAB — BASIC METABOLIC PANEL
BUN/Creatinine Ratio: 12 (ref 12–28)
BUN: 8 mg/dL (ref 8–27)
CO2: 24 mmol/L (ref 20–29)
Calcium: 9.7 mg/dL (ref 8.7–10.3)
Chloride: 104 mmol/L (ref 96–106)
Creatinine, Ser: 0.67 mg/dL (ref 0.57–1.00)
GFR calc Af Amer: 107 mL/min/{1.73_m2} (ref >59)
GFR calc non Af Amer: 93 mL/min/{1.73_m2} (ref >59)
Glucose: 130 mg/dL — ABNORMAL HIGH (ref 65–99)
Potassium: 4.5 mmol/L (ref 3.5–5.2)
Sodium: 143 mmol/L (ref 134–144)

## 2016-11-18 LAB — HEMOGLOBIN A1C
Est. average glucose Bld gHb Est-mCnc: 140 mg/dL
Hgb A1c MFr Bld: 6.5 % — ABNORMAL HIGH (ref 4.8–5.6)

## 2016-12-10 DIAGNOSIS — H20022 Recurrent acute iridocyclitis, left eye: Secondary | ICD-10-CM | POA: Diagnosis not present

## 2017-01-12 DIAGNOSIS — H20022 Recurrent acute iridocyclitis, left eye: Secondary | ICD-10-CM | POA: Diagnosis not present

## 2017-01-12 LAB — HM DIABETES EYE EXAM

## 2017-01-16 ENCOUNTER — Encounter: Payer: Self-pay | Admitting: Internal Medicine

## 2017-02-05 DIAGNOSIS — H20022 Recurrent acute iridocyclitis, left eye: Secondary | ICD-10-CM | POA: Diagnosis not present

## 2017-02-05 DIAGNOSIS — H5319 Other subjective visual disturbances: Secondary | ICD-10-CM | POA: Diagnosis not present

## 2017-02-06 ENCOUNTER — Encounter: Payer: Self-pay | Admitting: Internal Medicine

## 2017-02-06 ENCOUNTER — Ambulatory Visit (INDEPENDENT_AMBULATORY_CARE_PROVIDER_SITE_OTHER): Payer: Medicare HMO | Admitting: Internal Medicine

## 2017-02-06 VITALS — BP 148/78 | HR 79 | Temp 98.5°F | Ht 62.0 in | Wt 174.0 lb

## 2017-02-06 DIAGNOSIS — J40 Bronchitis, not specified as acute or chronic: Secondary | ICD-10-CM | POA: Diagnosis not present

## 2017-02-06 MED ORDER — LEVOFLOXACIN 500 MG PO TABS: 500.0000 mg | ORAL_TABLET | Freq: Every day | ORAL | 0 refills | Status: DC

## 2017-02-06 MED ORDER — HYDROCOD POLST-CPM POLST ER 10-8 MG/5ML PO SUER: 5.0000 mL | Freq: Two times a day (BID) | ORAL | 0 refills | Status: DC

## 2017-02-06 NOTE — Progress Notes (Signed)
Date:  02/06/2017   Name:  Kathy Dougherty   DOB:  [birth year]   MRN:  [id number]   Chief Complaint: Cough (X 4 days- Throat sore and aching on body. No Fever. Cough is DRY and when sleeping coughing is worse. No facial pressure or congestion. Chest is tight and congested. )  Cough  This is a new problem. The current episode started in the past 7 days. The problem has been unchanged. The problem occurs every few minutes. The cough is non-productive. Associated symptoms include chest pain, postnasal drip, a sore throat, shortness of breath and wheezing. Pertinent negatives include no chills, ear pain, fever or sweats. The symptoms are aggravated by lying down and exercise.     Review of Systems  Constitutional: Positive for fatigue. Negative for chills and fever.  HENT: Positive for postnasal drip and sore throat. Negative for ear pain and sinus pressure.   Respiratory: Positive for cough, chest tightness, shortness of breath and wheezing.   Cardiovascular: Positive for chest pain. Negative for palpitations.  Gastrointestinal: Negative for abdominal pain, diarrhea and nausea.    Patient Active Problem List   Diagnosis Date Noted  . Arthritis of right knee 01/15/2016  . Controlled type 2 diabetes mellitus without complication, without long-term current use of insulin ([place]) 03/15/2015  . Abnormal LFTs 03/15/2015  . Essential (primary) hypertension 03/15/2015  . Gastro-esophageal reflux disease without esophagitis 03/15/2015  . Allergic rhinitis, seasonal 03/15/2015    Prior to Admission medications   Medication Sig Start Date End Date Taking? Authorizing Provider  amLODipine (NORVASC) 2.5 MG tablet TAKE 1 TABLET BY MOUTH EVERY DAY 09/14/16   [name], MD  glimepiride (AMARYL) 2 MG tablet Take 1 tablet (2 mg total) by mouth daily before breakfast. 09/03/16   [name], MD  glucose blood (ONE TOUCH ULTRA TEST) test strip  05/23/14   [provider]  ibuprofen  (ADVIL,MOTRIN) 600 MG tablet Take 1 tablet (600 mg total) by mouth every 8 (eight) hours as needed. 03/19/15   [name], MD  levocetirizine (XYZAL) 5 MG tablet TAKE 1 TABLET (5 MG TOTAL) BY MOUTH EVERY EVENING. 07/14/16   [name], MD  lisinopril (PRINIVIL,ZESTRIL) 10 MG tablet Take 1 tablet (10 mg total) by mouth daily. 07/18/16   [name], MD  omeprazole (PRILOSEC) 40 MG capsule TAKE 1 CAPSULE (40 MG TOTAL) BY MOUTH DAILY. 10/27/16   [name], MD  simvastatin (ZOCOR) 10 MG tablet Take 1 tablet (10 mg total) by mouth at bedtime. 11/17/16   [name], MD  traMADol (ULTRAM) 50 MG tablet Take 1 tablet (50 mg total) by mouth every 8 (eight) hours as needed. 01/15/16   [name], MD    Allergies  Allergen Reactions  . Metformin And Related Diarrhea  . Penicillins Rash  . Sulfa Antibiotics Rash    Past Surgical History:  Procedure Laterality Date  . CATARACT EXTRACTION Right 08/2014  . CATARACT EXTRACTION Left 04/2016  . PARTIAL HYSTERECTOMY     fibroids    Social History  Substance Use Topics  . Smoking status: Never Smoker  . Smokeless tobacco: Never Used  . Alcohol use No     Medication list has been reviewed and updated.  PHQ 2/9 Scores 03/20/2016  PHQ - 2 Score 0    Physical Exam  Constitutional: She is oriented to person, place, and time. She appears well-developed. No distress.  HENT:  Head: Normocephalic and atraumatic.  Right Ear: Tympanic membrane and ear canal normal.  Left Ear: Tympanic membrane and ear canal normal.  Nose: Right sinus exhibits maxillary sinus tenderness. Left sinus exhibits maxillary sinus tenderness.  Mouth/Throat: No posterior oropharyngeal erythema.  Cardiovascular: Normal rate, regular rhythm and normal heart sounds.   Pulmonary/Chest: Effort normal. No respiratory distress. She has decreased breath sounds. She has no wheezes. She has no rhonchi.  Musculoskeletal: Normal range of motion.    Neurological: She is alert and oriented to person, place, and time.  Skin: Skin is warm and dry. No rash noted.  Psychiatric: She has a normal mood and affect. Her behavior is normal. Thought content normal.  Nursing note and vitals reviewed.   BP (!) 148/78 (BP Location: Right Arm, Patient Position: Sitting, Cuff Size: Normal)   Pulse 79   Temp 98.5 F (36.9 C) (Oral)   Ht 5\' 2"  (1.575 m)   Wt 174 lb (78.9 kg)   SpO2 98%   BMI 31.83 kg/m   Assessment and Plan: 1. Bronchitis Continue fluids and rest - chlorpheniramine-HYDROcodone (TUSSIONEX PENNKINETIC ER) 10-8 MG/5ML SUER; Take 5 mLs by mouth 2 (two) times daily.  Dispense: 115 mL; Refill: 0 - levofloxacin (LEVAQUIN) 500 MG tablet; Take 1 tablet (500 mg total) by mouth daily.  Dispense: 7 tablet; Refill: 0   Meds ordered this encounter  Medications  . chlorpheniramine-HYDROcodone (TUSSIONEX PENNKINETIC ER) 10-8 MG/5ML SUER    Sig: Take 5 mLs by mouth 2 (two) times daily.    Dispense:  115 mL    Refill:  0  . levofloxacin (LEVAQUIN) 500 MG tablet    Sig: Take 1 tablet (500 mg total) by mouth daily.    Dispense:  7 tablet    Refill:  0    Partially dictated using Editor, commissioning. Any errors are unintentional.  [name], MD [facility]  02/06/2017

## 2017-02-23 DIAGNOSIS — H16223 Keratoconjunctivitis sicca, not specified as Sjogren's, bilateral: Secondary | ICD-10-CM | POA: Diagnosis not present

## 2017-02-23 DIAGNOSIS — H20022 Recurrent acute iridocyclitis, left eye: Secondary | ICD-10-CM | POA: Diagnosis not present

## 2017-02-26 DIAGNOSIS — Z1231 Encounter for screening mammogram for malignant neoplasm of breast: Secondary | ICD-10-CM | POA: Diagnosis not present

## 2017-03-20 DIAGNOSIS — H20022 Recurrent acute iridocyclitis, left eye: Secondary | ICD-10-CM | POA: Diagnosis not present

## 2017-03-23 ENCOUNTER — Ambulatory Visit (INDEPENDENT_AMBULATORY_CARE_PROVIDER_SITE_OTHER): Payer: Medicare HMO

## 2017-03-23 VITALS — BP 140/78 | HR 80 | Temp 98.2°F | Resp 16 | Ht 62.0 in | Wt 176.4 lb

## 2017-03-23 DIAGNOSIS — E2839 Other primary ovarian failure: Secondary | ICD-10-CM | POA: Diagnosis not present

## 2017-03-23 DIAGNOSIS — Z13820 Encounter for screening for osteoporosis: Secondary | ICD-10-CM

## 2017-03-23 DIAGNOSIS — Z780 Asymptomatic menopausal state: Secondary | ICD-10-CM

## 2017-03-23 DIAGNOSIS — Z23 Encounter for immunization: Secondary | ICD-10-CM

## 2017-03-23 DIAGNOSIS — Z0000 Encounter for general adult medical examination without abnormal findings: Secondary | ICD-10-CM | POA: Diagnosis not present

## 2017-03-23 NOTE — Progress Notes (Signed)
Subjective:   Kathy Dougherty is a 66 y.o. female who presents for Medicare Annual (Subsequent) preventive examination.  Review of Systems:  N/A Cardiac Risk Factors include: advanced age (>58men, >59 women);diabetes mellitus;hypertension;obesity (BMI >30kg/m2)     Objective:     Vitals: BP 140/78 (BP Location: Right Arm, Patient Position: Sitting, Cuff Size: Normal)   Pulse 80   Temp 98.2 F (36.8 C) (Oral)   Resp 16   Ht 5\' 2"  (1.575 m)   Wt 176 lb 6.4 oz (80 kg)   BMI 32.26 kg/m   Body mass index is 32.26 kg/m.   Tobacco Social History   Tobacco Use  Smoking Status Never Smoker  Smokeless Tobacco Never Used     Counseling given: Not Answered   Past Medical History:  Diagnosis Date  . Diabetes mellitus without complication ([place])   . GERD (gastroesophageal reflux disease)   . Hypertension   . Seasonal allergies    Past Surgical History:  Procedure Laterality Date  . CATARACT EXTRACTION Right 08/2014  . CATARACT EXTRACTION Left 04/2016  . HAMMER TOE SURGERY    . PARTIAL HYSTERECTOMY     fibroids   Family History  Problem Relation Age of Onset  . Diabetes Mother   . Cancer Brother   . Diabetes Sister   . Cancer Brother   . Heart disease Brother   . Sudden death Brother   . Heart disease Brother    Social History   Substance and Sexual Activity  Sexual Activity Not on file    Outpatient Encounter Medications as of 03/23/2017  Medication Sig  . amLODipine (NORVASC) 2.5 MG tablet TAKE 1 TABLET BY MOUTH EVERY DAY  . glimepiride (AMARYL) 2 MG tablet Take 1 tablet (2 mg total) by mouth daily before breakfast.  . glucose blood (ONE TOUCH ULTRA TEST) test strip   . ibuprofen (ADVIL,MOTRIN) 600 MG tablet Take 1 tablet (600 mg total) by mouth every 8 (eight) hours as needed.  [name] levocetirizine (XYZAL) 5 MG tablet TAKE 1 TABLET (5 MG TOTAL) BY MOUTH EVERY EVENING.  [name] lisinopril (PRINIVIL,ZESTRIL) 10 MG tablet Take 1 tablet (10 mg total) by mouth daily.  [name]  omeprazole (PRILOSEC) 40 MG capsule TAKE 1 CAPSULE (40 MG TOTAL) BY MOUTH DAILY.  . simvastatin (ZOCOR) 10 MG tablet Take 1 tablet (10 mg total) by mouth at bedtime.  . chlorpheniramine-HYDROcodone (TUSSIONEX PENNKINETIC ER) 10-8 MG/5ML SUER Take 5 mLs by mouth 2 (two) times daily. (Patient not taking: Reported on 03/23/2017)  . levofloxacin (LEVAQUIN) 500 MG tablet Take 1 tablet (500 mg total) by mouth daily. (Patient not taking: Reported on 03/23/2017)  . traMADol (ULTRAM) 50 MG tablet Take 1 tablet (50 mg total) by mouth every 8 (eight) hours as needed. (Patient not taking: Reported on 03/23/2017)   No facility-administered encounter medications on file as of 03/23/2017.     Activities of Daily Living In your present state of health, do you have any difficulty performing the following activities: 03/23/2017  Hearing? N  Vision? N  Difficulty concentrating or making decisions? N  Walking or climbing stairs? N  Dressing or bathing? N  Doing errands, shopping? N  Preparing Food and eating ? N  Using the Toilet? N  In the past six months, have you accidently leaked urine? N  Do you have problems with loss of bowel control? N  Managing your Medications? N  Managing your Finances? N  Housekeeping or managing your Housekeeping? N  Some recent data  might be hidden    Patient Care Team: [name], MD as PCP - General (Family Medicine) Griffin Dakin (Optometry)    Assessment:     Exercise Activities and Dietary recommendations Current Exercise Habits: The patient does not participate in regular exercise at present, Exercise limited by: None identified  Goals    None     Fall Risk Fall Risk  03/23/2017 03/20/2016  Falls in the past year? Yes No  Number falls in past yr: 1 -  Injury with Fall? No -  Follow up Education provided;Falls prevention discussed -   Depression Screen PHQ 2/9 Scores 03/23/2017 03/20/2016  PHQ - 2 Score 0 0     Cognitive Function     6CIT Screen  03/23/2017  What Year? 0 points  What month? 0 points  What time? 0 points  Count back from 20 0 points  Months in reverse 0 points  Repeat phrase 2 points  Total Score 2    Immunization History  Administered Date(s) Administered  . Influenza,inj,Quad PF,6+ Mos 03/19/2015, 03/20/2016  . Pneumococcal Conjugate-13 03/20/2016  . Zoster 11/05/2012   Screening Tests Health Maintenance  Topic Date Due  . TETANUS/TDAP  02/09/1970  . DEXA SCAN  02/10/2016  . INFLUENZA VACCINE  12/17/2016  . PNA vac Low Risk Adult (2 of 2 - PPSV23) 03/20/2017  . HEMOGLOBIN A1C  05/20/2017  . FOOT EXAM  07/18/2017  . OPHTHALMOLOGY EXAM  01/12/2018  . MAMMOGRAM  02/26/2018  . COLONOSCOPY  05/20/2022  . Hepatitis C Screening  Addressed      Plan:  I have personally reviewed and addressed the Medicare Annual Wellness questionnaire and have noted the following in the patient's chart:  A. Medical and social history B. Use of alcohol, tobacco or illicit drugs  C. Current medications and supplements D. Functional ability and status E.  Nutritional status F.  Physical activity G. Advance directives H. List of other physicians I.  Hospitalizations, surgeries, and ER visits in previous 12 months J.  [place] such as hearing and vision if needed, cognitive and depression L. Referrals and appointments - none  In addition, I have reviewed and discussed with patient certain preventive protocols, quality metrics, and best practice recommendations. A written personalized care plan for preventive services as well as general preventive health recommendations were provided to patient.  See attached scanned questionnaire for additional information.   Signed,  Aleatha Borer, LPN Nurse Health Advisor  MD Recommendations: Flu shot and Pneumovax 23 given today. Also ordered Dexa scan. Message sent to referrals coordinator re: need for auth and scheduling of Dexa.

## 2017-03-23 NOTE — Patient Instructions (Signed)
[name] , Thank you for taking time to come for your Medicare Wellness Visit. I appreciate your ongoing commitment to your health goals. Please review the following plan we discussed and let me know if I can assist you in the future.   Screening recommendations/referrals: Colonoscopy: Completed 12/14/12. Repeat colonoscopy every 10 years Mammogram: Completed 02/26/17. Repeat mammogram annually Bone Density: Ordered today. Please call [phone] to schedule your bone density scan.  Recommended yearly ophthalmology/optometry visit for glaucoma screening and checkup Recommended yearly dental visit for hygiene and checkup  Vaccinations: Influenza vaccine: Given today Pneumococcal vaccine: Completed series today Tdap vaccine: Declined. Please call your insurance company to determine your out of pocket expense. Shingles vaccine: Up to date  Advanced directives: Advance directive discussed with you today. I have provided a copy for you to complete at home and have notarized. Once this is complete please bring a copy in to our office so we can scan it into your chart.  Conditions/risks identified: Fall risk prevention discussed  Next appointment: Scheduled to see Dr. [name] on 03/26/17 @ 9:30am. Please schedule your annual wellness exam with your Nurse Health Advisor in one year.   Preventive Care 75 Years and Older, Female Preventive care refers to lifestyle choices and visits with your health care provider that can promote health and wellness. What does preventive care include?  A yearly physical exam. This is also called an annual well check.  Dental exams once or twice a year.  Routine eye exams. Ask your health care provider how often you should have your eyes checked.  Personal lifestyle choices, including:  Daily care of your teeth and gums.  Regular physical activity.  Eating a healthy diet.  Avoiding tobacco and drug use.  Limiting alcohol use.  Practicing safe  sex.  Taking low-dose aspirin every day.  Taking vitamin and mineral supplements as recommended by your health care provider. What happens during an annual well check? The services and screenings done by your health care provider during your annual well check will depend on your age, overall health, lifestyle risk factors, and family history of disease. Counseling  Your health care provider may ask you questions about your:  Alcohol use.  Tobacco use.  Drug use.  Emotional well-being.  Home and relationship well-being.  Sexual activity.  Eating habits.  History of falls.  Memory and ability to understand (cognition).  Work and work Statistician.  Reproductive health. Screening  You may have the following tests or measurements:  Height, weight, and BMI.  Blood pressure.  Lipid and cholesterol levels. These may be checked every 5 years, or more frequently if you are over 38 years old.  Skin check.  Lung cancer screening. You may have this screening every year starting at age 69 if you have a 30-pack-year history of smoking and currently smoke or have quit within the past 15 years.  Fecal occult blood test (FOBT) of the stool. You may have this test every year starting at age 65.  Flexible sigmoidoscopy or colonoscopy. You may have a sigmoidoscopy every 5 years or a colonoscopy every 10 years starting at age 56.  Hepatitis C blood test.  Hepatitis B blood test.  Sexually transmitted disease (STD) testing.  Diabetes screening. This is done by checking your blood sugar (glucose) after you have not eaten for a while (fasting). You may have this done every 1-3 years.  Bone density scan. This is done to screen for osteoporosis. You may have this done starting at  age 36.  Mammogram. This may be done every 1-2 years. Talk to your health care provider about how often you should have regular mammograms. Talk with your health care provider about your test results,  treatment options, and if necessary, the need for more tests. Vaccines  Your health care provider may recommend certain vaccines, such as:  Influenza vaccine. This is recommended every year.  Tetanus, diphtheria, and acellular pertussis (Tdap, Td) vaccine. You may need a Td booster every 10 years.  Zoster vaccine. You may need this after age 44.  Pneumococcal 13-valent conjugate (PCV13) vaccine. One dose is recommended after age 5.  Pneumococcal polysaccharide (PPSV23) vaccine. One dose is recommended after age 74. Talk to your health care provider about which screenings and vaccines you need and how often you need them. This information is not intended to replace advice given to you by your health care provider. Make sure you discuss any questions you have with your health care provider. Document Released: 06/01/2015 Document Revised: 01/23/2016 Document Reviewed: 03/06/2015 Elsevier Interactive Patient Education  2017 [place] Prevention in the Home Falls can cause injuries. They can happen to people of all ages. There are many things you can do to make your home safe and to help prevent falls. What can I do on the outside of my home?  Regularly fix the edges of walkways and driveways and fix any cracks.  Remove anything that might make you trip as you walk through a door, such as a raised step or threshold.  Trim any bushes or trees on the path to your home.  Use bright outdoor lighting.  Clear any walking paths of anything that might make someone trip, such as rocks or tools.  Regularly check to see if handrails are loose or broken. Make sure that both sides of any steps have handrails.  Any raised decks and porches should have guardrails on the edges.  Have any leaves, snow, or ice cleared regularly.  Use sand or salt on walking paths during winter.  Clean up any spills in your garage right away. This includes oil or grease spills. What can I do in the  bathroom?  Use night lights.  Install grab bars by the toilet and in the tub and shower. Do not use towel bars as grab bars.  Use non-skid mats or decals in the tub or shower.  If you need to sit down in the shower, use a plastic, non-slip stool.  Keep the floor dry. Clean up any water that spills on the floor as soon as it happens.  Remove soap buildup in the tub or shower regularly.  Attach bath mats securely with double-sided non-slip rug tape.  Do not have throw rugs and other things on the floor that can make you trip. What can I do in the bedroom?  Use night lights.  Make sure that you have a light by your bed that is easy to reach.  Do not use any sheets or blankets that are too big for your bed. They should not hang down onto the floor.  Have a firm chair that has side arms. You can use this for support while you get dressed.  Do not have throw rugs and other things on the floor that can make you trip. What can I do in the kitchen?  Clean up any spills right away.  Avoid walking on wet floors.  Keep items that you use a lot in easy-to-reach places.  If you  need to reach something above you, use a strong step stool that has a grab bar.  Keep electrical cords out of the way.  Do not use floor polish or wax that makes floors slippery. If you must use wax, use non-skid floor wax.  Do not have throw rugs and other things on the floor that can make you trip. What can I do with my stairs?  Do not leave any items on the stairs.  Make sure that there are handrails on both sides of the stairs and use them. Fix handrails that are broken or loose. Make sure that handrails are as long as the stairways.  Check any carpeting to make sure that it is firmly attached to the stairs. Fix any carpet that is loose or worn.  Avoid having throw rugs at the top or bottom of the stairs. If you do have throw rugs, attach them to the floor with carpet tape.  Make sure that you have a  light switch at the top of the stairs and the bottom of the stairs. If you do not have them, ask someone to add them for you. What else can I do to help prevent falls?  Wear shoes that:  Do not have high heels.  Have rubber bottoms.  Are comfortable and fit you well.  Are closed at the toe. Do not wear sandals.  If you use a stepladder:  Make sure that it is fully opened. Do not climb a closed stepladder.  Make sure that both sides of the stepladder are locked into place.  Ask someone to hold it for you, if possible.  Clearly mark and make sure that you can see:  Any grab bars or handrails.  First and last steps.  Where the edge of each step is.  Use tools that help you move around (mobility aids) if they are needed. These include:  Canes.  Walkers.  Scooters.  Crutches.  Turn on the lights when you go into a dark area. Replace any light bulbs as soon as they burn out.  Set up your furniture so you have a clear path. Avoid moving your furniture around.  If any of your floors are uneven, fix them.  If there are any pets around you, be aware of where they are.  Review your medicines with your doctor. Some medicines can make you feel dizzy. This can increase your chance of falling. Ask your doctor what other things that you can do to help prevent falls. This information is not intended to replace advice given to you by your health care provider. Make sure you discuss any questions you have with your health care provider. Document Released: 03/01/2009 Document Revised: 10/11/2015 Document Reviewed: 06/09/2014 Elsevier Interactive Patient Education  2017 [employer].

## 2017-03-25 ENCOUNTER — Encounter: Payer: Self-pay | Admitting: Internal Medicine

## 2017-03-25 ENCOUNTER — Other Ambulatory Visit: Payer: Self-pay | Admitting: Internal Medicine

## 2017-03-26 ENCOUNTER — Ambulatory Visit (INDEPENDENT_AMBULATORY_CARE_PROVIDER_SITE_OTHER): Payer: Medicare HMO | Admitting: Internal Medicine

## 2017-03-26 ENCOUNTER — Encounter: Payer: Self-pay | Admitting: Internal Medicine

## 2017-03-26 VITALS — BP 130/82 | HR 80 | Ht 62.0 in | Wt 175.6 lb

## 2017-03-26 DIAGNOSIS — K219 Gastro-esophageal reflux disease without esophagitis: Secondary | ICD-10-CM

## 2017-03-26 DIAGNOSIS — E1169 Type 2 diabetes mellitus with other specified complication: Secondary | ICD-10-CM | POA: Insufficient documentation

## 2017-03-26 DIAGNOSIS — Z0000 Encounter for general adult medical examination without abnormal findings: Secondary | ICD-10-CM | POA: Diagnosis not present

## 2017-03-26 DIAGNOSIS — E785 Hyperlipidemia, unspecified: Secondary | ICD-10-CM | POA: Diagnosis not present

## 2017-03-26 DIAGNOSIS — L84 Corns and callosities: Secondary | ICD-10-CM | POA: Diagnosis not present

## 2017-03-26 DIAGNOSIS — H209 Unspecified iridocyclitis: Secondary | ICD-10-CM | POA: Diagnosis not present

## 2017-03-26 DIAGNOSIS — E119 Type 2 diabetes mellitus without complications: Secondary | ICD-10-CM

## 2017-03-26 DIAGNOSIS — I10 Essential (primary) hypertension: Secondary | ICD-10-CM

## 2017-03-26 LAB — POCT URINALYSIS DIPSTICK
Bilirubin, UA: NEGATIVE
Glucose, UA: NEGATIVE
Ketones, UA: NEGATIVE
Leukocytes, UA: NEGATIVE
Nitrite, UA: NEGATIVE
Protein, UA: NEGATIVE
Spec Grav, UA: 1.015 (ref 1.010–1.025)
Urobilinogen, UA: 0.2 E.U./dL
pH, UA: 6 (ref 5.0–8.0)

## 2017-03-26 MED ORDER — LISINOPRIL 10 MG PO TABS: 10.0000 mg | ORAL_TABLET | Freq: Every day | ORAL | 3 refills | Status: DC

## 2017-03-26 MED ORDER — SIMVASTATIN 10 MG PO TABS: 10.0000 mg | ORAL_TABLET | Freq: Every day | ORAL | 3 refills | Status: DC

## 2017-03-26 NOTE — Progress Notes (Signed)
Date:  03/26/2017   Name:  Kathy Dougherty   DOB:  1951-05-11   MRN:  [id number]   Chief Complaint: Annual Exam; Diabetes; Hyperlipidemia; and Hypertension Kathy Dougherty is a 66 y.o. female who presents today for her Complete Annual Exam. She feels fairly well. She reports exercising walking some. She reports she is sleeping fairly well.   Diabetes  She presents for her follow-up diabetic visit. She has type 2 diabetes mellitus. Her disease course has been stable. Pertinent negatives for hypoglycemia include no dizziness, headaches, nervousness/anxiousness or tremors. Pertinent negatives for diabetes include no chest pain, no fatigue, no foot ulcerations (but thick calluses), no polydipsia and no polyuria. She is compliant with treatment all of the time. An ACE inhibitor/angiotensin II receptor blocker is being taken.  Hyperlipidemia  This is a chronic problem. Pertinent negatives include no chest pain or shortness of breath. Current antihyperlipidemic treatment includes statins. The current treatment provides significant improvement of lipids.  Hypertension  This is a chronic problem. The problem has been waxing and waning since onset. The problem is resistant. Pertinent negatives include no chest pain, headaches, palpitations or shortness of breath. Past treatments include ACE inhibitors and calcium channel blockers.  Eye Problem   The left eye is affected. This is a chronic problem. Episode onset: 7 months ago. The problem has been waxing and waning. Injury mechanism: dx'd as iritis - using prednisone drops but every times she tapers, the redness and pain return. The pain is moderate. Associated symptoms include eye redness. Pertinent negatives include no fever or vomiting.  Gastroesophageal Reflux  She complains of heartburn. She reports no abdominal pain, no chest pain, no coughing or no wheezing. This is a recurrent problem. The problem occurs rarely. Pertinent negatives include no fatigue.  She has tried a PPI for the symptoms.   Lab Results  Component Value Date   HGBA1C 6.5 (H) 11/17/2016   Wt Readings from Last 3 Encounters:  03/26/17 175 lb 9.6 oz (79.7 kg)  03/23/17 176 lb 6.4 oz (80 kg)  02/06/17 174 lb (78.9 kg)      Review of Systems  Constitutional: Negative for chills, fatigue and fever.  HENT: Negative for congestion, hearing loss, tinnitus, trouble swallowing and voice change.   Eyes: Positive for pain and redness. Negative for visual disturbance.  Respiratory: Negative for cough, chest tightness, shortness of breath and wheezing.   Cardiovascular: Negative for chest pain, palpitations and leg swelling.  Gastrointestinal: Positive for heartburn. Negative for abdominal pain, constipation, diarrhea and vomiting.  Endocrine: Negative for polydipsia and polyuria.  Genitourinary: Negative for dysuria, frequency, genital sores, vaginal bleeding and vaginal discharge.  Musculoskeletal: Negative for arthralgias, gait problem and joint swelling.  Skin: Negative for color change and rash.  Allergic/Immunologic: Negative for environmental allergies.  Neurological: Negative for dizziness, tremors, light-headedness and headaches.  Hematological: Negative for adenopathy. Does not bruise/bleed easily.  Psychiatric/Behavioral: Negative for dysphoric mood and sleep disturbance. The patient is not nervous/anxious.     Patient Active Problem List   Diagnosis Date Noted  . Hyperlipidemia associated with type 2 diabetes mellitus ([place]) 03/26/2017  . 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 Arthritis of right knee 01/15/2016  . Controlled type 2 diabetes mellitus without complication, without long-term current use of insulin ([place]) 03/15/2015  . Abnormal LFTs 03/15/2015  . Essential (primary) hypertension 03/15/2015  . Gastro-esophageal reflux disease without esophagitis 03/15/2015  . Allergic rhinitis, seasonal 03/15/2015    Prior to Admission medications  Medication Sig Start  Date End Date Taking? Authorizing Provider  glimepiride (AMARYL) 2 MG tablet Take 1 tablet (2 mg total) by mouth daily before breakfast. 09/03/16  Yes [name], MD  glucose blood (ONE TOUCH ULTRA TEST) test strip  05/23/14  Yes [provider]  ibuprofen (ADVIL,MOTRIN) 600 MG tablet Take 1 tablet (600 mg total) by mouth every 8 (eight) hours as needed. 03/19/15  Yes [name], MD  levocetirizine (XYZAL) 5 MG tablet TAKE 1 TABLET (5 MG TOTAL) BY MOUTH EVERY EVENING. 07/14/16  Yes [name], MD  lisinopril (PRINIVIL,ZESTRIL) 10 MG tablet Take 1 tablet (10 mg total) by mouth daily. 07/18/16  Yes [name], MD  omeprazole (PRILOSEC) 40 MG capsule TAKE 1 CAPSULE (40 MG TOTAL) BY MOUTH DAILY. 10/27/16  Yes [name], MD  simvastatin (ZOCOR) 10 MG tablet Take 1 tablet (10 mg total) by mouth at bedtime. 11/17/16  Yes [name], MD  traMADol (ULTRAM) 50 MG tablet Take 1 tablet (50 mg total) by mouth every 8 (eight) hours as needed. 01/15/16  Yes [name], MD  amLODipine (NORVASC) 2.5 MG tablet TAKE 1 TABLET BY MOUTH EVERY DAY 09/14/16   [name], MD    Allergies  Allergen Reactions  . Metformin And Related Diarrhea  . Penicillins Rash  . Sulfa Antibiotics Rash    Past Surgical History:  Procedure Laterality Date  . CATARACT EXTRACTION Right 08/2014  . CATARACT EXTRACTION Left 04/2016  . HAMMER TOE SURGERY    . PARTIAL HYSTERECTOMY     fibroids    Social History   Tobacco Use  . Smoking status: Never Smoker  . Smokeless tobacco: Never Used  Substance Use Topics  . Alcohol use: No    Alcohol/week: 0.0 oz  . Drug use: No     Medication list has been reviewed and updated.  PHQ 2/9 Scores 03/26/2017 03/23/2017 03/20/2016  PHQ - 2 Score 0 0 0    Physical Exam  Constitutional: She is oriented to person, place, and time. She appears well-developed and well-nourished. No distress.  HENT:  Head: Normocephalic and atraumatic.    Right Ear: Tympanic membrane and ear canal normal.  Left Ear: Tympanic membrane and ear canal normal.  Nose: Right sinus exhibits no maxillary sinus tenderness. Left sinus exhibits no maxillary sinus tenderness.  Mouth/Throat: Uvula is midline and oropharynx is clear and moist.  Eyes: EOM are normal. Right eye exhibits no discharge. Left eye exhibits no discharge. Left conjunctiva is injected. No scleral icterus.  Neck: Normal range of motion. Carotid bruit is not present. No erythema present. No thyromegaly present.  Cardiovascular: Normal rate, regular rhythm, normal heart sounds and normal pulses.  Pulmonary/Chest: Effort normal. No respiratory distress. She has no wheezes. Right breast exhibits no mass, no nipple discharge, no skin change and no tenderness. Left breast exhibits no mass, no nipple discharge, no skin change and no tenderness.  Abdominal: Soft. Bowel sounds are normal. There is no hepatosplenomegaly. There is no tenderness. There is no CVA tenderness.  Musculoskeletal: Normal range of motion.  Lymphadenopathy:    She has no cervical adenopathy.    She has no axillary adenopathy.  Neurological: She is alert and oriented to person, place, and time. She has normal reflexes. No cranial nerve deficit or sensory deficit.  Skin: Skin is warm, dry and intact. No rash noted.  Thick callus on both feet - see media  Psychiatric: She has a normal  mood and affect. Her speech is normal and behavior is normal. Thought content normal.  Nursing note and vitals reviewed.   BP 130/82   Pulse 80   Ht 5\' 2"  (1.575 m)   Wt 175 lb 9.6 oz (79.7 kg)   SpO2 99%   BMI 32.12 kg/m   Assessment and Plan: 1. Annual physical exam MAW completed HM up to date  2. Essential (primary) hypertension controlled - lisinopril (PRINIVIL,ZESTRIL) 10 MG tablet; Take 1 tablet (10 mg total) daily by mouth.  Dispense: 90 tablet; Refill: 3 - CBC with Differential/Platelet - TSH - POCT urinalysis  dipstick  3. Gastro-esophageal reflux disease without esophagitis Stable on daily PPI  4. Controlled type 2 diabetes mellitus without complication, without long-term current use of insulin ([place]) Doing well on current medication - simvastatin (ZOCOR) 10 MG tablet; Take 1 tablet (10 mg total) at bedtime by mouth.  Dispense: 90 tablet; Refill: 3 - Comprehensive metabolic panel - Hemoglobin A1c - Microalbumin / creatinine urine ratio  5. Hyperlipidemia associated with type 2 diabetes mellitus ([place]) On statin therapy - Lipid panel  6. Iritis of left eye Recommend follow up with Ophthalomology  7. Pre-ulcerative calluses Refer to podiatry - Ambulatory referral to Podiatry   Meds ordered this encounter  Medications  . lisinopril (PRINIVIL,ZESTRIL) 10 MG tablet    Sig: Take 1 tablet (10 mg total) daily by mouth.    Dispense:  90 tablet    Refill:  3  . simvastatin (ZOCOR) 10 MG tablet    Sig: Take 1 tablet (10 mg total) at bedtime by mouth.    Dispense:  90 tablet    Refill:  3    Partially dictated using Editor, commissioning. Any errors are unintentional.  [name], MD [facility]  03/26/2017

## 2017-03-27 LAB — COMPREHENSIVE METABOLIC PANEL
ALT: 14 IU/L (ref 0–32)
AST: 18 IU/L (ref 0–40)
Albumin/Globulin Ratio: 1.2 (ref 1.2–2.2)
Albumin: 4.2 g/dL (ref 3.6–4.8)
Alkaline Phosphatase: 84 IU/L (ref 39–117)
BUN/Creatinine Ratio: 17 (ref 12–28)
BUN: 10 mg/dL (ref 8–27)
Bilirubin Total: 0.4 mg/dL (ref 0.0–1.2)
CO2: 25 mmol/L (ref 20–29)
Calcium: 9.4 mg/dL (ref 8.7–10.3)
Chloride: 103 mmol/L (ref 96–106)
Creatinine, Ser: 0.58 mg/dL (ref 0.57–1.00)
GFR calc Af Amer: 111 mL/min/{1.73_m2} (ref >59)
GFR calc non Af Amer: 96 mL/min/{1.73_m2} (ref >59)
Globulin, Total: 3.4 g/dL (ref 1.5–4.5)
Glucose: 118 mg/dL — ABNORMAL HIGH (ref 65–99)
Potassium: 4.1 mmol/L (ref 3.5–5.2)
Sodium: 142 mmol/L (ref 134–144)
Total Protein: 7.6 g/dL (ref 6.0–8.5)

## 2017-03-27 LAB — CBC WITH DIFFERENTIAL/PLATELET
Basophils Absolute: 0 10*3/uL (ref 0.0–0.2)
Basos: 1 %
EOS (ABSOLUTE): 0.1 10*3/uL (ref 0.0–0.4)
Eos: 2 %
Hematocrit: 38 % (ref 34.0–46.6)
Hemoglobin: 12.6 g/dL (ref 11.1–15.9)
Immature Grans (Abs): 0 10*3/uL (ref 0.0–0.1)
Immature Granulocytes: 0 %
Lymphocytes Absolute: 2 10*3/uL (ref 0.7–3.1)
Lymphs: 37 %
MCH: 29 pg (ref 26.6–33.0)
MCHC: 33.2 g/dL (ref 31.5–35.7)
MCV: 87 fL (ref 79–97)
Monocytes Absolute: 0.3 10*3/uL (ref 0.1–0.9)
Monocytes: 7 %
Neutrophils Absolute: 2.8 10*3/uL (ref 1.4–7.0)
Neutrophils: 53 %
Platelets: 241 10*3/uL (ref 150–379)
RBC: 4.35 x10E6/uL (ref 3.77–5.28)
RDW: 13.8 % (ref 12.3–15.4)
WBC: 5.3 10*3/uL (ref 3.4–10.8)

## 2017-03-27 LAB — TSH: TSH: 1.33 u[IU]/mL (ref 0.450–4.500)

## 2017-03-27 LAB — LIPID PANEL
Chol/HDL Ratio: 2.6 ratio (ref 0.0–4.4)
Cholesterol, Total: 107 mg/dL (ref 100–199)
HDL: 41 mg/dL (ref >39)
LDL Calculated: 58 mg/dL (ref 0–99)
Triglycerides: 38 mg/dL (ref 0–149)
VLDL Cholesterol Cal: 8 mg/dL (ref 5–40)

## 2017-03-27 LAB — HEMOGLOBIN A1C
Est. average glucose Bld gHb Est-mCnc: 143 mg/dL
Hgb A1c MFr Bld: 6.6 % — ABNORMAL HIGH (ref 4.8–5.6)

## 2017-04-14 DIAGNOSIS — L851 Acquired keratosis [keratoderma] palmaris et plantaris: Secondary | ICD-10-CM | POA: Diagnosis not present

## 2017-04-14 DIAGNOSIS — B351 Tinea unguium: Secondary | ICD-10-CM | POA: Diagnosis not present

## 2017-04-14 DIAGNOSIS — E119 Type 2 diabetes mellitus without complications: Secondary | ICD-10-CM | POA: Diagnosis not present

## 2017-04-14 DIAGNOSIS — M7751 Other enthesopathy of right foot: Secondary | ICD-10-CM | POA: Diagnosis not present

## 2017-04-30 ENCOUNTER — Other Ambulatory Visit: Payer: Self-pay

## 2017-05-01 DIAGNOSIS — H20022 Recurrent acute iridocyclitis, left eye: Secondary | ICD-10-CM | POA: Diagnosis not present

## 2017-05-05 ENCOUNTER — Encounter: Payer: Self-pay | Admitting: Internal Medicine

## 2017-05-05 ENCOUNTER — Telehealth: Payer: Self-pay

## 2017-05-05 ENCOUNTER — Ambulatory Visit (INDEPENDENT_AMBULATORY_CARE_PROVIDER_SITE_OTHER): Payer: Medicare HMO | Admitting: Internal Medicine

## 2017-05-05 VITALS — BP 136/78 | HR 84 | Temp 98.2°F | Ht 62.0 in | Wt 174.0 lb

## 2017-05-05 DIAGNOSIS — J40 Bronchitis, not specified as acute or chronic: Secondary | ICD-10-CM

## 2017-05-05 MED ORDER — LEVOFLOXACIN 500 MG PO TABS: 500.0000 mg | ORAL_TABLET | Freq: Every day | ORAL | 0 refills | Status: AC

## 2017-05-05 MED ORDER — PREDNISONE 10 MG PO TABS: ORAL_TABLET | ORAL | 0 refills | Status: DC

## 2017-05-05 NOTE — Progress Notes (Signed)
Date:  05/05/2017   Name:  Kathy Dougherty   DOB:  05-24-1950   MRN:  [id number]   Chief Complaint: Cough (Cough with green production. Sick for 5 days. No fever noticed. Most of congestion is in chest. )  Cough  This is a new problem. The current episode started in the past 7 days. The problem has been gradually worsening. The problem occurs every few minutes. The cough is productive of purulent sputum. Associated symptoms include shortness of breath and wheezing. Pertinent negatives include no chills, fever, nasal congestion, postnasal drip or rash. Nothing aggravates the symptoms.     Review of Systems  Constitutional: Negative for chills and fever.  HENT: Negative for postnasal drip.   Respiratory: Positive for cough, shortness of breath and wheezing.   Gastrointestinal: Negative for abdominal pain, diarrhea and vomiting.  Skin: Negative for rash.  Psychiatric/Behavioral: Positive for sleep disturbance (from cough).    Patient Active Problem List   Diagnosis Date Noted  . Hyperlipidemia associated with type 2 diabetes mellitus ([place]) 03/26/2017  . 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 Pre-ulcerative calluses 03/26/2017  . Arthritis of right knee 01/15/2016  . Controlled type 2 diabetes mellitus without complication, without long-term current use of insulin ([place]) 03/15/2015  . Abnormal LFTs 03/15/2015  . Essential (primary) hypertension 03/15/2015  . Gastro-esophageal reflux disease without esophagitis 03/15/2015  . Allergic rhinitis, seasonal 03/15/2015    Prior to Admission medications   Medication Sig Start Date End Date Taking? Authorizing Provider  amLODipine (NORVASC) 2.5 MG tablet TAKE 1 TABLET BY MOUTH EVERY DAY 09/14/16  Yes [name], MD  glimepiride (AMARYL) 2 MG tablet Take 1 tablet (2 mg total) by mouth daily before breakfast. 09/03/16  Yes [name], MD  glucose blood (ONE TOUCH ULTRA TEST) test strip  05/23/14  Yes [provider]  ibuprofen  (ADVIL,MOTRIN) 600 MG tablet Take 1 tablet (600 mg total) by mouth every 8 (eight) hours as needed. 03/19/15  Yes [name], MD  levocetirizine (XYZAL) 5 MG tablet TAKE 1 TABLET (5 MG TOTAL) BY MOUTH EVERY EVENING. 07/14/16  Yes [name], MD  lisinopril (PRINIVIL,ZESTRIL) 10 MG tablet Take 1 tablet (10 mg total) daily by mouth. 03/26/17  Yes [name], MD  omeprazole (PRILOSEC) 40 MG capsule TAKE 1 CAPSULE (40 MG TOTAL) BY MOUTH DAILY. 10/27/16  Yes [name], MD  simvastatin (ZOCOR) 10 MG tablet Take 1 tablet (10 mg total) at bedtime by mouth. 03/26/17  Yes [name], MD  traMADol (ULTRAM) 50 MG tablet Take 1 tablet (50 mg total) by mouth every 8 (eight) hours as needed. 01/15/16  Yes [name], MD    Allergies  Allergen Reactions  . Metformin And Related Diarrhea  . Penicillins Rash  . Sulfa Antibiotics Rash    Past Surgical History:  Procedure Laterality Date  . CATARACT EXTRACTION Right 08/2014  . CATARACT EXTRACTION Left 04/2016  . HAMMER TOE SURGERY    . PARTIAL HYSTERECTOMY     fibroids    Social History   Tobacco Use  . Smoking status: Never Smoker  . Smokeless tobacco: Never Used  Substance Use Topics  . Alcohol use: No    Alcohol/week: 0.0 oz  . Drug use: No     Medication list has been reviewed and updated.  PHQ 2/9 Scores 03/26/2017 03/23/2017 03/20/2016  PHQ - 2 Score 0 0 0    Physical Exam  Constitutional: She  is oriented to person, place, and time. She appears well-developed. No distress.  HENT:  Head: Normocephalic and atraumatic.  Neck: Normal range of motion. Neck supple.  Cardiovascular: Normal rate, regular rhythm and normal heart sounds.  Pulmonary/Chest: Effort normal and breath sounds normal. No respiratory distress. She has no wheezes.  Very loose productive cough noted  Musculoskeletal: Normal range of motion.  Neurological: She is alert and oriented to person, place, and time.  Skin: Skin is warm  and dry. No rash noted.  Psychiatric: She has a normal mood and affect. Her behavior is normal. Thought content normal.  Nursing note and vitals reviewed.   BP 136/78   Pulse 84   Temp 98.2 F (36.8 C) (Oral)   Ht 5\' 2"  (1.575 m)   Wt 174 lb (78.9 kg)   SpO2 98%   BMI 31.83 kg/m   Assessment and Plan: 1. Bronchitis Continue Mucinex Cough suppressants fluids - levofloxacin (LEVAQUIN) 500 MG tablet; Take 1 tablet (500 mg total) by mouth daily for 7 days.  Dispense: 7 tablet; Refill: 0 - predniSONE (DELTASONE) 10 MG tablet; Take 6 on day 1, 5 on day 2, 4 on day 3, 3 on day 4, 2 on day 5 and 1 on day 1 then stop.  Dispense: 21 tablet; Refill: 0   Meds ordered this encounter  Medications  . levofloxacin (LEVAQUIN) 500 MG tablet    Sig: Take 1 tablet (500 mg total) by mouth daily for 7 days.    Dispense:  7 tablet    Refill:  0  . predniSONE (DELTASONE) 10 MG tablet    Sig: Take 6 on day 1, 5 on day 2, 4 on day 3, 3 on day 4, 2 on day 5 and 1 on day 1 then stop.    Dispense:  21 tablet    Refill:  0    Partially dictated using Editor, commissioning. Any errors are unintentional.  [name], MD [facility]  05/05/2017

## 2017-05-05 NOTE — Telephone Encounter (Signed)
Patient called stating she is at the end of having a cold but wants antibiotics sent in to break up her mucous. Informed patient we cannot send anything in without seeing her first for a office visit. The phone went quiet and I did not get response. Unsure if lost signal of call so I Called back and unable to reach patient- no VM set up to leave VM.

## 2017-05-05 NOTE — Patient Instructions (Signed)
Begin Mucinex twice a day  Take Prednisone only if you are worsening

## 2017-07-24 ENCOUNTER — Encounter: Payer: Self-pay | Admitting: Internal Medicine

## 2017-07-24 ENCOUNTER — Other Ambulatory Visit: Payer: Self-pay | Admitting: Internal Medicine

## 2017-07-24 ENCOUNTER — Ambulatory Visit (INDEPENDENT_AMBULATORY_CARE_PROVIDER_SITE_OTHER): Payer: Medicare HMO | Admitting: Internal Medicine

## 2017-07-24 VITALS — BP 140/86 | HR 71 | Ht 62.0 in | Wt 176.0 lb

## 2017-07-24 DIAGNOSIS — E785 Hyperlipidemia, unspecified: Secondary | ICD-10-CM

## 2017-07-24 DIAGNOSIS — I10 Essential (primary) hypertension: Secondary | ICD-10-CM

## 2017-07-24 DIAGNOSIS — E1169 Type 2 diabetes mellitus with other specified complication: Secondary | ICD-10-CM | POA: Diagnosis not present

## 2017-07-24 DIAGNOSIS — R05 Cough: Secondary | ICD-10-CM | POA: Diagnosis not present

## 2017-07-24 DIAGNOSIS — E119 Type 2 diabetes mellitus without complications: Secondary | ICD-10-CM

## 2017-07-24 DIAGNOSIS — R059 Cough, unspecified: Secondary | ICD-10-CM

## 2017-07-24 DIAGNOSIS — M1711 Unilateral primary osteoarthritis, right knee: Secondary | ICD-10-CM | POA: Diagnosis not present

## 2017-07-24 MED ORDER — LISINOPRIL 10 MG PO TABS: 10.0000 mg | ORAL_TABLET | Freq: Every day | ORAL | 3 refills | Status: DC

## 2017-07-24 MED ORDER — SIMVASTATIN 10 MG PO TABS: 10.0000 mg | ORAL_TABLET | Freq: Every day | ORAL | 3 refills | Status: DC

## 2017-07-24 NOTE — Progress Notes (Signed)
Date:  07/24/2017   Name:  Kathy Dougherty   DOB:  10-25-1950   MRN:  [id number]   Chief Complaint: Diabetes and Hypertension Diabetes  She presents for her follow-up diabetic visit. She has type 2 diabetes mellitus. Pertinent negatives for hypoglycemia include no headaches or tremors. Pertinent negatives for diabetes include no chest pain, no fatigue, no foot paresthesias, no foot ulcerations, no polydipsia and no polyuria. Symptoms are stable. Current diabetic treatment includes oral agent (monotherapy). She is compliant with treatment all of the time. She is following a generally healthy diet. An ACE inhibitor/angiotensin II receptor blocker is being taken.  Hypertension  This is a chronic problem. The problem is controlled. Pertinent negatives include no chest pain, headaches, palpitations or shortness of breath. Past treatments include ACE inhibitors and calcium channel blockers. The current treatment provides significant improvement.  Cough  This is a chronic problem. The current episode started more than 1 month ago. The problem has been unchanged. The problem occurs every few hours. The cough is non-productive. Pertinent negatives include no chest pain, fever, headaches, rash, shortness of breath or wheezing. There is no history of environmental allergies.  Knee Pain   There was no injury mechanism. The pain is present in the right knee. The quality of the pain is described as aching. The pain is at a severity of 1/10. Pertinent negatives include no numbness.   Lab Results  Component Value Date   HGBA1C 6.6 (H) 03/26/2017   Lab Results  Component Value Date   CREATININE 0.58 03/26/2017   BUN 10 03/26/2017   NA 142 03/26/2017   K 4.1 03/26/2017   CL 103 03/26/2017   CO2 25 03/26/2017      Review of Systems  Constitutional: Negative for appetite change, fatigue, fever and unexpected weight change.  HENT: Negative for tinnitus and trouble swallowing.   Eyes: Negative for  visual disturbance.  Respiratory: Positive for cough. Negative for chest tightness, shortness of breath and wheezing.   Cardiovascular: Negative for chest pain, palpitations and leg swelling.  Gastrointestinal: Negative for abdominal pain.  Endocrine: Negative for polydipsia and polyuria.  Genitourinary: Negative for dysuria and hematuria.  Musculoskeletal: Positive for arthralgias (knee pain).  Skin: Negative for color change and rash.  Allergic/Immunologic: Negative for environmental allergies.  Neurological: Negative for tremors, numbness and headaches.  Psychiatric/Behavioral: Negative for dysphoric mood and sleep disturbance.    Patient Active Problem List   Diagnosis Date Noted  . Hyperlipidemia associated with type 2 diabetes mellitus ([place]) 03/26/2017  . 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 Pre-ulcerative calluses 03/26/2017  . Arthritis of right knee 01/15/2016  . Controlled type 2 diabetes mellitus without complication, without long-term current use of insulin ([place]) 03/15/2015  . Abnormal LFTs 03/15/2015  . Essential (primary) hypertension 03/15/2015  . Gastro-esophageal reflux disease without esophagitis 03/15/2015  . Allergic rhinitis, seasonal 03/15/2015    Prior to Admission medications   Medication Sig Start Date End Date Taking? Authorizing Provider  amLODipine (NORVASC) 2.5 MG tablet TAKE 1 TABLET BY MOUTH EVERY DAY 09/14/16   [name], MD  glimepiride (AMARYL) 2 MG tablet Take 1 tablet (2 mg total) by mouth daily before breakfast. 09/03/16   [name], MD  glucose blood (ONE TOUCH ULTRA TEST) test strip  05/23/14   [provider]  ibuprofen (ADVIL,MOTRIN) 600 MG tablet Take 1 tablet (600 mg total) by mouth every 8 (eight) hours as needed. 03/19/15   [name], MD  levocetirizine (XYZAL) 5 MG tablet TAKE 1 TABLET (5 MG TOTAL) BY MOUTH EVERY EVENING. 07/14/16   [name], MD  lisinopril (PRINIVIL,ZESTRIL) 10 MG tablet Take 1 tablet (10  mg total) daily by mouth. 03/26/17   [name], MD  omeprazole (PRILOSEC) 40 MG capsule TAKE 1 CAPSULE (40 MG TOTAL) BY MOUTH DAILY. 10/27/16   [name], MD  predniSONE (DELTASONE) 10 MG tablet Take 6 on day 1, 5 on day 2, 4 on day 3, 3 on day 4, 2 on day 5 and 1 on day 1 then stop. 05/05/17   [name], MD  simvastatin (ZOCOR) 10 MG tablet Take 1 tablet (10 mg total) at bedtime by mouth. 03/26/17   [name], MD  traMADol (ULTRAM) 50 MG tablet Take 1 tablet (50 mg total) by mouth every 8 (eight) hours as needed. 01/15/16   [name], MD    Allergies  Allergen Reactions  . Metformin And Related Diarrhea  . Penicillins Rash  . Sulfa Antibiotics Rash    Past Surgical History:  Procedure Laterality Date  . CATARACT EXTRACTION Right 08/2014  . CATARACT EXTRACTION Left 04/2016  . HAMMER TOE SURGERY    . PARTIAL HYSTERECTOMY     fibroids    Social History   Tobacco Use  . Smoking status: Never Smoker  . Smokeless tobacco: Never Used  Substance Use Topics  . Alcohol use: No    Alcohol/week: 0.0 oz  . Drug use: No     Medication list has been reviewed and updated.  PHQ 2/9 Scores 03/26/2017 03/23/2017 03/20/2016  PHQ - 2 Score 0 0 0    Physical Exam  Constitutional: She is oriented to person, place, and time. She appears well-developed. No distress.  HENT:  Head: Normocephalic and atraumatic.  Neck: Normal range of motion. Neck supple.  Cardiovascular: Normal rate, regular rhythm and normal heart sounds.  Pulmonary/Chest: Effort normal and breath sounds normal. No respiratory distress. She has no wheezes. She has no rales.  Musculoskeletal: Normal range of motion.       Right knee: She exhibits normal range of motion and no effusion.       Left knee: Normal.       Legs: Neurological: She is alert and oriented to person, place, and time.  Skin: Skin is warm and dry. No rash noted.  Psychiatric: She has a normal mood and affect. Her speech  is normal and behavior is normal. Thought content normal.  Nursing note and vitals reviewed.   BP 140/86   Pulse 71   Ht 5\' 2"  (1.575 m)   Wt 176 lb (79.8 kg)   SpO2 98%   BMI 32.19 kg/m   Assessment and Plan: 1. Controlled type 2 diabetes mellitus without complication, without long-term current use of insulin (HCC) Continue oral agents - Hemoglobin A1c - Microalbumin / creatinine urine ratio - simvastatin (ZOCOR) 10 MG tablet; Take 1 tablet (10 mg total) by mouth at bedtime.  Dispense: 90 tablet; Refill: 3  2. Essential (primary) hypertension controlled - lisinopril (PRINIVIL,ZESTRIL) 10 MG tablet; Take 1 tablet (10 mg total) by mouth daily.  Dispense: 90 tablet; Refill: 3  3. Cough Hold ACE to see if cough is from this  4. Hyperlipidemia associated with type 2 diabetes mellitus ([place]) Continue statin  5. Arthritis of right knee Possible small baker's cyst - recommend watchful waiting   Meds ordered this encounter  Medications  . lisinopril (PRINIVIL,ZESTRIL) 10 MG  tablet    Sig: Take 1 tablet (10 mg total) by mouth daily.    Dispense:  90 tablet    Refill:  3  . simvastatin (ZOCOR) 10 MG tablet    Sig: Take 1 tablet (10 mg total) by mouth at bedtime.    Dispense:  90 tablet    Refill:  3    Partially dictated using Editor, commissioning. Any errors are unintentional.  [name], MD [facility]  07/24/2017

## 2017-07-25 ENCOUNTER — Other Ambulatory Visit: Payer: Self-pay | Admitting: Internal Medicine

## 2017-07-25 DIAGNOSIS — K219 Gastro-esophageal reflux disease without esophagitis: Secondary | ICD-10-CM

## 2017-07-25 LAB — HEMOGLOBIN A1C
Est. average glucose Bld gHb Est-mCnc: 151 mg/dL
Hgb A1c MFr Bld: 6.9 % — ABNORMAL HIGH (ref 4.8–5.6)

## 2017-07-27 ENCOUNTER — Other Ambulatory Visit: Payer: Self-pay | Admitting: Internal Medicine

## 2017-07-27 DIAGNOSIS — K219 Gastro-esophageal reflux disease without esophagitis: Secondary | ICD-10-CM

## 2017-07-28 DIAGNOSIS — E119 Type 2 diabetes mellitus without complications: Secondary | ICD-10-CM | POA: Diagnosis not present

## 2017-07-28 DIAGNOSIS — Z961 Presence of intraocular lens: Secondary | ICD-10-CM | POA: Diagnosis not present

## 2017-07-28 LAB — MICROALBUMIN / CREATININE URINE RATIO
Creatinine, Urine: 61.9 mg/dL
Microalb/Creat Ratio: 7.8 mg/g creat (ref 0.0–30.0)
Microalbumin, Urine: 4.8 ug/mL

## 2017-08-18 ENCOUNTER — Ambulatory Visit
Admission: RE | Admit: 2017-08-18 | Discharge: 2017-08-18 | Disposition: A | Discharge status: Home / Self Care | Payer: Medicare HMO | Source: Ambulatory Visit | Attending: Internal Medicine | Admitting: Internal Medicine

## 2017-08-18 DIAGNOSIS — M85832 Other specified disorders of bone density and structure, left forearm: Secondary | ICD-10-CM | POA: Insufficient documentation

## 2017-08-18 DIAGNOSIS — M85851 Other specified disorders of bone density and structure, right thigh: Secondary | ICD-10-CM | POA: Insufficient documentation

## 2017-08-18 DIAGNOSIS — Z13820 Encounter for screening for osteoporosis: Secondary | ICD-10-CM

## 2017-08-18 DIAGNOSIS — E2839 Other primary ovarian failure: Secondary | ICD-10-CM

## 2017-08-18 DIAGNOSIS — Z780 Asymptomatic menopausal state: Secondary | ICD-10-CM

## 2017-09-01 DIAGNOSIS — H20022 Recurrent acute iridocyclitis, left eye: Secondary | ICD-10-CM | POA: Diagnosis not present

## 2017-09-01 LAB — HM DIABETES EYE EXAM

## 2017-09-21 ENCOUNTER — Other Ambulatory Visit: Payer: Self-pay | Admitting: Internal Medicine

## 2017-09-21 MED ORDER — AMLODIPINE BESYLATE 2.5 MG PO TABS: 2.5000 mg | ORAL_TABLET | Freq: Every day | ORAL | 3 refills | Status: DC

## 2017-09-24 ENCOUNTER — Other Ambulatory Visit: Payer: Self-pay | Admitting: Internal Medicine

## 2017-11-24 ENCOUNTER — Encounter: Payer: Self-pay | Admitting: Internal Medicine

## 2017-11-24 ENCOUNTER — Ambulatory Visit (INDEPENDENT_AMBULATORY_CARE_PROVIDER_SITE_OTHER): Payer: Medicare HMO | Admitting: Internal Medicine

## 2017-11-24 VITALS — BP 142/76 | HR 60 | Ht 62.0 in | Wt 180.0 lb

## 2017-11-24 DIAGNOSIS — I10 Essential (primary) hypertension: Secondary | ICD-10-CM

## 2017-11-24 DIAGNOSIS — M79671 Pain in right foot: Secondary | ICD-10-CM | POA: Diagnosis not present

## 2017-11-24 DIAGNOSIS — E119 Type 2 diabetes mellitus without complications: Secondary | ICD-10-CM | POA: Diagnosis not present

## 2017-11-24 MED ORDER — LOSARTAN POTASSIUM 50 MG PO TABS: 50.0000 mg | ORAL_TABLET | Freq: Every day | ORAL | 1 refills | Status: DC

## 2017-11-24 NOTE — Patient Instructions (Signed)
Ibuprofen 600 mg three times a day with food 

## 2017-11-24 NOTE — Progress Notes (Signed)
Date:  11/24/2017   Name:  Kathy Dougherty   DOB:  1950/10/25   MRN:  [id number]   Chief Complaint: Hypertension and Diabetes Hypertension  This is a chronic problem. The problem is controlled. Pertinent negatives include no chest pain, headaches, palpitations or shortness of breath. Past treatments include calcium channel blockers and ACE inhibitors. The current treatment provides significant improvement. Compliance problems: possible cough from lisinopril.   Diabetes  She presents for her follow-up diabetic visit. She has type 2 diabetes mellitus. Her disease course has been stable. Pertinent negatives for hypoglycemia include no headaches or tremors. Pertinent negatives for diabetes include no chest pain, no fatigue, no polydipsia and no polyuria. Current diabetic treatment includes oral agent (monotherapy). She is compliant with treatment all of the time. She is following a generally healthy diet. An ACE inhibitor/angiotensin II receptor blocker is being taken.  Foot Injury   Incident onset: may have worn a shoe with a tight strap. There was no injury mechanism. The pain is present in the right foot. The pain is mild. The pain has been worsening since onset. Pertinent negatives include no inability to bear weight, loss of motion or numbness. She has tried elevation for the symptoms.   Lab Results  Component Value Date   HGBA1C 6.9 (H) 07/24/2017     Review of Systems  Constitutional: Negative for appetite change, fatigue, fever and unexpected weight change.  HENT: Negative for tinnitus and trouble swallowing.   Eyes: Negative for visual disturbance.  Respiratory: Negative for cough, chest tightness and shortness of breath.   Cardiovascular: Negative for chest pain, palpitations and leg swelling.  Gastrointestinal: Negative for abdominal pain.  Endocrine: Negative for polydipsia and polyuria.  Genitourinary: Negative for dysuria and hematuria.  Musculoskeletal: Positive for  arthralgias (right foot pain and redness).  Neurological: Negative for tremors, numbness and headaches.  Psychiatric/Behavioral: Negative for dysphoric mood.    Patient Active Problem List   Diagnosis Date Noted  . Hyperlipidemia associated with type 2 diabetes mellitus ([place]) 03/26/2017  . 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 Pre-ulcerative calluses 03/26/2017  . Arthritis of right knee 01/15/2016  . Controlled type 2 diabetes mellitus without complication, without long-term current use of insulin ([place]) 03/15/2015  . Abnormal LFTs 03/15/2015  . Essential (primary) hypertension 03/15/2015  . Gastro-esophageal reflux disease without esophagitis 03/15/2015  . Allergic rhinitis, seasonal 03/15/2015    Prior to Admission medications   Medication Sig Start Date End Date Taking? Authorizing Provider  amLODipine (NORVASC) 2.5 MG tablet Take 1 tablet (2.5 mg total) by mouth daily. 09/21/17   [name], MD  glimepiride (AMARYL) 2 MG tablet TAKE 1 TABLET (2 MG TOTAL) BY MOUTH DAILY BEFORE BREAKFAST. 09/24/17   [name], MD  glucose blood (ONE TOUCH ULTRA TEST) test strip  05/23/14   [provider]  ibuprofen (ADVIL,MOTRIN) 600 MG tablet Take 1 tablet (600 mg total) by mouth every 8 (eight) hours as needed. 03/19/15   [name], MD  levocetirizine (XYZAL) 5 MG tablet TAKE 1 TABLET (5 MG TOTAL) BY MOUTH EVERY EVENING. 07/14/16   [name], MD  lisinopril (PRINIVIL,ZESTRIL) 10 MG tablet Take 1 tablet (10 mg total) by mouth daily. 07/24/17   [name], MD  omeprazole (PRILOSEC) 40 MG capsule TAKE 1 CAPSULE BY MOUTH EVERY DAY 07/27/17   [name], MD         simvastatin (ZOCOR) 10 MG tablet Take 1 tablet (10 mg  total) by mouth at bedtime. 07/24/17   [name], MD  traMADol (ULTRAM) 50 MG tablet Take 1 tablet (50 mg total) by mouth every 8 (eight) hours as needed. 01/15/16   [name], MD    Allergies  Allergen Reactions  . Metformin And  Related Diarrhea  . Penicillins Rash  . Sulfa Antibiotics Rash    Past Surgical History:  Procedure Laterality Date  . CATARACT EXTRACTION Right 08/2014  . CATARACT EXTRACTION Left 04/2016  . HAMMER TOE SURGERY    . PARTIAL HYSTERECTOMY     fibroids    Social History   Tobacco Use  . Smoking status: Never Smoker  . Smokeless tobacco: Never Used  Substance Use Topics  . Alcohol use: No    Alcohol/week: 0.0 oz  . Drug use: No     Medication list has been reviewed and updated.  Current Meds  Medication Sig  . amLODipine (NORVASC) 2.5 MG tablet Take 1 tablet (2.5 mg total) by mouth daily.  [name] glimepiride (AMARYL) 2 MG tablet TAKE 1 TABLET (2 MG TOTAL) BY MOUTH DAILY BEFORE BREAKFAST.  [name] glucose blood (ONE TOUCH ULTRA TEST) test strip   . ibuprofen (ADVIL,MOTRIN) 600 MG tablet Take 1 tablet (600 mg total) by mouth every 8 (eight) hours as needed.  [name] levocetirizine (XYZAL) 5 MG tablet TAKE 1 TABLET (5 MG TOTAL) BY MOUTH EVERY EVENING.  [name] lisinopril (PRINIVIL,ZESTRIL) 10 MG tablet Take 1 tablet (10 mg total) by mouth daily.  [name] omeprazole (PRILOSEC) 40 MG capsule TAKE 1 CAPSULE BY MOUTH EVERY DAY  . simvastatin (ZOCOR) 10 MG tablet Take 1 tablet (10 mg total) by mouth at bedtime.  . traMADol (ULTRAM) 50 MG tablet Take 1 tablet (50 mg total) by mouth every 8 (eight) hours as needed.    PHQ 2/9 Scores 03/26/2017 03/23/2017 03/20/2016  PHQ - 2 Score 0 0 0    Physical Exam  Constitutional: She is oriented to person, place, and time. She appears well-developed. No distress.  HENT:  Head: Normocephalic and atraumatic.  Neck: Normal range of motion. Neck supple.  Cardiovascular: Normal rate, regular rhythm and normal heart sounds.  Pulmonary/Chest: Effort normal and breath sounds normal. No respiratory distress.  Musculoskeletal: Normal range of motion.       Feet:  Neurological: She is alert and oriented to person, place, and time.  Skin: Skin is warm and dry. No rash noted.    Psychiatric: She has a normal mood and affect. Her behavior is normal. Thought content normal.  Nursing note and vitals reviewed.   BP (!) 142/76 (BP Location: Right Arm, Patient Position: Sitting, Cuff Size: Normal)   Pulse 60   Ht 5\' 2"  (1.575 m)   Wt 180 lb (81.6 kg)   SpO2 98%   BMI 32.92 kg/m   Assessment and Plan: 1. Controlled type 2 diabetes mellitus without complication, without long-term current use of insulin (HCC) Continue current therapy - Hemoglobin X2J - Basic metabolic panel  2. Essential (primary) hypertension Stop ACE due to cough and begin losartan - losartan (COZAAR) 50 MG tablet; Take 1 tablet (50 mg total) by mouth daily.  Dispense: 90 tablet; Refill: 1  3. Acute foot pain, right May be gout or pressure related Begin advil 600 mg tid  - Uric acid   Meds ordered this encounter  Medications  . losartan (COZAAR) 50 MG tablet    Sig: Take 1 tablet (50 mg total) by mouth daily.    Dispense:  90 tablet    Refill:  1    Partially dictated using Editor, commissioning. Any errors are unintentional.  [name], MD [facility]  11/24/2017

## 2017-11-25 LAB — BASIC METABOLIC PANEL
BUN/Creatinine Ratio: 17 (ref 12–28)
BUN: 12 mg/dL (ref 8–27)
CO2: 24 mmol/L (ref 20–29)
Calcium: 9.5 mg/dL (ref 8.7–10.3)
Chloride: 102 mmol/L (ref 96–106)
Creatinine, Ser: 0.7 mg/dL (ref 0.57–1.00)
GFR calc Af Amer: 104 mL/min/{1.73_m2} (ref >59)
GFR calc non Af Amer: 91 mL/min/{1.73_m2} (ref >59)
Glucose: 144 mg/dL — ABNORMAL HIGH (ref 65–99)
Potassium: 4.2 mmol/L (ref 3.5–5.2)
Sodium: 141 mmol/L (ref 134–144)

## 2017-11-25 LAB — HEMOGLOBIN A1C
Est. average glucose Bld gHb Est-mCnc: 160 mg/dL
Hgb A1c MFr Bld: 7.2 % — ABNORMAL HIGH (ref 4.8–5.6)

## 2017-11-25 LAB — URIC ACID: Uric Acid: 5.4 mg/dL (ref 2.5–7.1)

## 2017-11-26 ENCOUNTER — Encounter: Payer: Self-pay | Admitting: Internal Medicine

## 2017-11-27 ENCOUNTER — Telehealth: Payer: Self-pay

## 2017-11-27 NOTE — Telephone Encounter (Signed)
Patient notified of lab results form 11/25/17

## 2018-03-22 ENCOUNTER — Ambulatory Visit (INDEPENDENT_AMBULATORY_CARE_PROVIDER_SITE_OTHER): Payer: Medicare HMO

## 2018-03-22 DIAGNOSIS — Z23 Encounter for immunization: Secondary | ICD-10-CM | POA: Diagnosis not present

## 2018-03-23 ENCOUNTER — Telehealth: Payer: Self-pay

## 2018-03-23 NOTE — Telephone Encounter (Signed)
Faxed to [facility]!

## 2018-03-24 ENCOUNTER — Ambulatory Visit: Payer: Medicare HMO

## 2018-03-25 ENCOUNTER — Ambulatory Visit: Payer: Medicare HMO

## 2018-03-29 ENCOUNTER — Ambulatory Visit: Payer: Medicare HMO

## 2018-03-29 ENCOUNTER — Encounter: Payer: Medicare HMO | Admitting: Internal Medicine

## 2018-04-09 ENCOUNTER — Encounter: Payer: Self-pay | Admitting: Internal Medicine

## 2018-04-09 ENCOUNTER — Ambulatory Visit (INDEPENDENT_AMBULATORY_CARE_PROVIDER_SITE_OTHER): Payer: Medicare HMO | Admitting: Internal Medicine

## 2018-04-09 VITALS — BP 118/78 | HR 77 | Ht 62.0 in | Wt 178.0 lb

## 2018-04-09 DIAGNOSIS — R69 Illness, unspecified: Secondary | ICD-10-CM | POA: Diagnosis not present

## 2018-04-09 DIAGNOSIS — Z1231 Encounter for screening mammogram for malignant neoplasm of breast: Secondary | ICD-10-CM

## 2018-04-09 DIAGNOSIS — E1169 Type 2 diabetes mellitus with other specified complication: Secondary | ICD-10-CM | POA: Diagnosis not present

## 2018-04-09 DIAGNOSIS — E785 Hyperlipidemia, unspecified: Secondary | ICD-10-CM

## 2018-04-09 DIAGNOSIS — K219 Gastro-esophageal reflux disease without esophagitis: Secondary | ICD-10-CM | POA: Diagnosis not present

## 2018-04-09 DIAGNOSIS — J3089 Other allergic rhinitis: Secondary | ICD-10-CM

## 2018-04-09 DIAGNOSIS — I10 Essential (primary) hypertension: Secondary | ICD-10-CM

## 2018-04-09 DIAGNOSIS — E119 Type 2 diabetes mellitus without complications: Secondary | ICD-10-CM

## 2018-04-09 DIAGNOSIS — Z0000 Encounter for general adult medical examination without abnormal findings: Secondary | ICD-10-CM

## 2018-04-09 LAB — POCT URINALYSIS DIPSTICK
Bilirubin, UA: NEGATIVE
Blood, UA: NEGATIVE
Glucose, UA: NEGATIVE
Ketones, UA: NEGATIVE
Leukocytes, UA: NEGATIVE
Nitrite, UA: NEGATIVE
Protein, UA: NEGATIVE
Spec Grav, UA: 1.01 (ref 1.010–1.025)
Urobilinogen, UA: 0.2 E.U./dL
pH, UA: 6 (ref 5.0–8.0)

## 2018-04-09 MED ORDER — GLUCOSE BLOOD VI STRP: ORAL_STRIP | 12 refills | Status: DC

## 2018-04-09 MED ORDER — LOSARTAN POTASSIUM 50 MG PO TABS: 50.0000 mg | ORAL_TABLET | Freq: Every day | ORAL | 3 refills | Status: DC

## 2018-04-09 MED ORDER — ONETOUCH ULTRASOFT LANCETS MISC: 12 refills | Status: DC

## 2018-04-09 MED ORDER — OMEPRAZOLE 40 MG PO CPDR: 40.0000 mg | DELAYED_RELEASE_CAPSULE | Freq: Every day | ORAL | 3 refills | Status: DC

## 2018-04-09 MED ORDER — LEVOCETIRIZINE DIHYDROCHLORIDE 5 MG PO TABS: 5.0000 mg | ORAL_TABLET | Freq: Every evening | ORAL | 5 refills | Status: DC

## 2018-04-09 NOTE — Progress Notes (Signed)
Date:  04/09/2018   Name:  Kathy Dougherty   DOB:  1950-07-14   MRN:  [id number]   Chief Complaint: Annual Exam Kathy Dougherty is a 67 y.o. female who presents today for her Complete Annual Exam. She feels well. She reports exercising regularly - mostly walking. She reports she is sleeping well. She is due for a mammogram.  Diabetes  She presents for her follow-up diabetic visit. She has type 2 diabetes mellitus. Her disease course has been stable. Pertinent negatives for hypoglycemia include no dizziness, headaches, nervousness/anxiousness or tremors. Pertinent negatives for diabetes include no chest pain, no fatigue, no polydipsia and no polyuria. She is compliant with treatment all of the time. She monitors blood glucose at home 3-4 x per week. An ACE inhibitor/angiotensin II receptor blocker is being taken. Eye exam is not current.  Hypertension  This is a chronic problem. The problem is unchanged. Pertinent negatives include no chest pain, headaches, palpitations or shortness of breath. Past treatments include angiotensin blockers.  Hyperlipidemia  This is a chronic problem. The problem is controlled. Pertinent negatives include no chest pain or shortness of breath. Current antihyperlipidemic treatment includes statins.  Gastroesophageal Reflux  She reports no abdominal pain, no chest pain, no coughing or no wheezing. Pertinent negatives include no fatigue.    Review of Systems  Constitutional: Negative for chills, fatigue and fever.  HENT: Negative for congestion, hearing loss, tinnitus, trouble swallowing and voice change.   Eyes: Negative for visual disturbance.  Respiratory: Negative for cough, chest tightness, shortness of breath and wheezing.   Cardiovascular: Negative for chest pain, palpitations and leg swelling.  Gastrointestinal: Negative for abdominal pain, constipation, diarrhea and vomiting.  Endocrine: Negative for polydipsia and polyuria.  Genitourinary: Negative for  dysuria, frequency, genital sores, vaginal bleeding and vaginal discharge.  Musculoskeletal: Negative for arthralgias, gait problem and joint swelling.  Skin: Negative for color change and rash.  Allergic/Immunologic: Negative for environmental allergies.  Neurological: Negative for dizziness, tremors, light-headedness and headaches.  Hematological: Negative for adenopathy. Does not bruise/bleed easily.  Psychiatric/Behavioral: Negative for dysphoric mood and sleep disturbance. The patient is not nervous/anxious.     Patient Active Problem List   Diagnosis Date Noted  . Hyperlipidemia associated with type 2 diabetes mellitus ([place]) 03/26/2017  . 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 Pre-ulcerative calluses 03/26/2017  . Arthritis of right knee 01/15/2016  . Controlled type 2 diabetes mellitus without complication, without long-term current use of insulin ([place]) 03/15/2015  . Abnormal LFTs 03/15/2015  . Essential (primary) hypertension 03/15/2015  . Gastro-esophageal reflux disease without esophagitis 03/15/2015  . Allergic rhinitis, seasonal 03/15/2015    Allergies  Allergen Reactions  . Ace Inhibitors Cough  . Metformin And Related Diarrhea  . Penicillins Rash  . Sulfa Antibiotics Rash    Past Surgical History:  Procedure Laterality Date  . CATARACT EXTRACTION Right 08/2014  . CATARACT EXTRACTION Left 04/2016  . HAMMER TOE SURGERY    . PARTIAL HYSTERECTOMY     fibroids    Social History   Tobacco Use  . Smoking status: Never Smoker  . Smokeless tobacco: Never Used  Substance Use Topics  . Alcohol use: No    Alcohol/week: 0.0 standard drinks  . Drug use: No     Medication list has been reviewed and updated.  Current Meds  Medication Sig  . amLODipine (NORVASC) 2.5 MG tablet Take 1 tablet (2.5 mg total) by mouth daily.  [name] glimepiride (AMARYL) 2 MG tablet TAKE  1 TABLET (2 MG TOTAL) BY MOUTH DAILY BEFORE BREAKFAST.  [name] glucose blood (ONE TOUCH ULTRA TEST) test strip  E11.9  . ibuprofen (ADVIL,MOTRIN) 600 MG tablet Take 1 tablet (600 mg total) by mouth every 8 (eight) hours as needed.  [name] levocetirizine (XYZAL) 5 MG tablet TAKE 1 TABLET (5 MG TOTAL) BY MOUTH EVERY EVENING.  [name] losartan (COZAAR) 50 MG tablet Take 1 tablet (50 mg total) by mouth daily.  [name] omeprazole (PRILOSEC) 40 MG capsule TAKE 1 CAPSULE BY MOUTH EVERY DAY  . simvastatin (ZOCOR) 10 MG tablet Take 1 tablet (10 mg total) by mouth at bedtime.  . traMADol (ULTRAM) 50 MG tablet Take 1 tablet (50 mg total) by mouth every 8 (eight) hours as needed.  . [DISCONTINUED] glucose blood (ONE TOUCH ULTRA TEST) test strip     PHQ 2/9 Scores 04/09/2018 03/26/2017 03/23/2017 03/20/2016  PHQ - 2 Score 0 0 0 0  PHQ- 9 Score 0 - - -    Physical Exam  Constitutional: She is oriented to person, place, and time. She appears well-developed and well-nourished. No distress.  HENT:  Head: Normocephalic and atraumatic.  Right Ear: Tympanic membrane and ear canal normal.  Left Ear: Tympanic membrane and ear canal normal.  Nose: Right sinus exhibits no maxillary sinus tenderness. Left sinus exhibits no maxillary sinus tenderness.  Mouth/Throat: Uvula is midline and oropharynx is clear and moist.  Eyes: Conjunctivae and EOM are normal. Right eye exhibits no discharge. Left eye exhibits no discharge. No scleral icterus.  Neck: Normal range of motion. Carotid bruit is not present. No erythema present. No thyromegaly present.  Cardiovascular: Normal rate, regular rhythm, normal heart sounds and normal pulses.  Pulmonary/Chest: Effort normal. No respiratory distress. She has no wheezes. Right breast exhibits no mass, no nipple discharge, no skin change and no tenderness. Left breast exhibits no mass, no nipple discharge, no skin change and no tenderness.  Abdominal: Soft. Bowel sounds are normal. There is no hepatosplenomegaly. There is no tenderness. There is no CVA tenderness.  Musculoskeletal: Normal range of motion. She  exhibits no edema or tenderness.  Lymphadenopathy:    She has no cervical adenopathy.    She has no axillary adenopathy.  Neurological: She is alert and oriented to person, place, and time. She has normal reflexes. No cranial nerve deficit or sensory deficit.  Skin: Skin is warm, dry and intact. No rash noted.  Psychiatric: She has a normal mood and affect. Her speech is normal and behavior is normal. Thought content normal.  Nursing note and vitals reviewed.   BP 118/78 (BP Location: Right Arm, Patient Position: Sitting, Cuff Size: Normal)   Pulse 77   Ht 5\' 2"  (1.575 m)   Wt 178 lb (80.7 kg)   SpO2 98%   BMI 32.56 kg/m   Assessment and Plan: 1. Annual physical exam Normal exam except for weight Recommend regular aerobic exercise - POCT urinalysis dipstick  2. Environmental and seasonal allergies controlled - levocetirizine (XYZAL) 5 MG tablet; Take 1 tablet (5 mg total) by mouth every evening.  Dispense: 30 tablet; Refill: 5  3. Essential (primary) hypertension Doing well on current therapy - losartan (COZAAR) 50 MG tablet; Take 1 tablet (50 mg total) by mouth daily.  Dispense: 90 tablet; Refill: 3 - CBC with Differential/Platelet - TSH  4. Gastro-esophageal reflux disease without esophagitis Controlled by PPI - omeprazole (PRILOSEC) 40 MG capsule; Take 1 capsule (40 mg total) by mouth daily.  Dispense: 90 capsule; Refill: 3  5. Hyperlipidemia associated with type 2 diabetes mellitus ([place]) On statin  - Lipid panel  6. Controlled type 2 diabetes mellitus without complication, without long-term current use of insulin ([place]) Controlled Need DM eye exam from [facility] - Comprehensive metabolic panel - Hemoglobin A1c  7. Encounter for screening mammogram for breast cancer Pt to schedule at [facility] - MM 3D SCREEN BREAST BILATERAL   Partially dictated using Editor, commissioning. Any errors are unintentional.  [name], MD [facility]  04/09/2018

## 2018-04-10 LAB — CBC WITH DIFFERENTIAL/PLATELET
Basophils Absolute: 0 10*3/uL (ref 0.0–0.2)
Basos: 1 %
EOS (ABSOLUTE): 0.1 10*3/uL (ref 0.0–0.4)
Eos: 3 %
Hematocrit: 36.3 % (ref 34.0–46.6)
Hemoglobin: 12.4 g/dL (ref 11.1–15.9)
Immature Grans (Abs): 0 10*3/uL (ref 0.0–0.1)
Immature Granulocytes: 0 %
Lymphocytes Absolute: 1.7 10*3/uL (ref 0.7–3.1)
Lymphs: 38 %
MCH: 28.7 pg (ref 26.6–33.0)
MCHC: 34.2 g/dL (ref 31.5–35.7)
MCV: 84 fL (ref 79–97)
Monocytes Absolute: 0.4 10*3/uL (ref 0.1–0.9)
Monocytes: 8 %
Neutrophils Absolute: 2.2 10*3/uL (ref 1.4–7.0)
Neutrophils: 50 %
Platelets: 212 10*3/uL (ref 150–450)
RBC: 4.32 x10E6/uL (ref 3.77–5.28)
RDW: 13 % (ref 12.3–15.4)
WBC: 4.5 10*3/uL (ref 3.4–10.8)

## 2018-04-10 LAB — COMPREHENSIVE METABOLIC PANEL
ALT: 14 IU/L (ref 0–32)
AST: 19 IU/L (ref 0–40)
Albumin/Globulin Ratio: 1.5 (ref 1.2–2.2)
Albumin: 4.3 g/dL (ref 3.6–4.8)
Alkaline Phosphatase: 80 IU/L (ref 39–117)
BUN/Creatinine Ratio: 13 (ref 12–28)
BUN: 9 mg/dL (ref 8–27)
Bilirubin Total: 0.5 mg/dL (ref 0.0–1.2)
CO2: 23 mmol/L (ref 20–29)
Calcium: 8.8 mg/dL (ref 8.7–10.3)
Chloride: 103 mmol/L (ref 96–106)
Creatinine, Ser: 0.7 mg/dL (ref 0.57–1.00)
GFR calc Af Amer: 104 mL/min/{1.73_m2} (ref >59)
GFR calc non Af Amer: 90 mL/min/{1.73_m2} (ref >59)
Globulin, Total: 2.9 g/dL (ref 1.5–4.5)
Glucose: 107 mg/dL — ABNORMAL HIGH (ref 65–99)
Potassium: 3.9 mmol/L (ref 3.5–5.2)
Sodium: 140 mmol/L (ref 134–144)
Total Protein: 7.2 g/dL (ref 6.0–8.5)

## 2018-04-10 LAB — LIPID PANEL
Chol/HDL Ratio: 2.6 ratio (ref 0.0–4.4)
Cholesterol, Total: 106 mg/dL (ref 100–199)
HDL: 41 mg/dL (ref >39)
LDL Calculated: 58 mg/dL (ref 0–99)
Triglycerides: 37 mg/dL (ref 0–149)
VLDL Cholesterol Cal: 7 mg/dL (ref 5–40)

## 2018-04-10 LAB — TSH: TSH: 1.06 u[IU]/mL (ref 0.450–4.500)

## 2018-04-10 LAB — HEMOGLOBIN A1C
Est. average glucose Bld gHb Est-mCnc: 157 mg/dL
Hgb A1c MFr Bld: 7.1 % — ABNORMAL HIGH (ref 4.8–5.6)

## 2018-04-13 ENCOUNTER — Encounter: Payer: Self-pay | Admitting: Internal Medicine

## 2018-04-19 ENCOUNTER — Encounter: Payer: Self-pay | Admitting: Internal Medicine

## 2018-04-19 ENCOUNTER — Ambulatory Visit (INDEPENDENT_AMBULATORY_CARE_PROVIDER_SITE_OTHER): Payer: Medicare HMO

## 2018-04-19 VITALS — BP 140/88 | HR 72 | Temp 98.1°F | Resp 16 | Ht 62.0 in | Wt 174.2 lb

## 2018-04-19 DIAGNOSIS — Z599 Problem related to housing and economic circumstances, unspecified: Secondary | ICD-10-CM

## 2018-04-19 DIAGNOSIS — Z0000 Encounter for general adult medical examination without abnormal findings: Secondary | ICD-10-CM

## 2018-04-19 DIAGNOSIS — Z598 Other problems related to housing and economic circumstances: Secondary | ICD-10-CM | POA: Diagnosis not present

## 2018-04-19 NOTE — Progress Notes (Signed)
Subjective:   Kathy Dougherty is a 67 y.o. female who presents for Medicare Annual (Subsequent) preventive examination.  Review of Systems:   Cardiac Risk Factors include: dyslipidemia;diabetes mellitus;obesity (BMI >30kg/m2)  Advanced age female > 4    Objective:     Vitals: BP 140/88 (BP Location: Right Arm, Patient Position: Sitting, Cuff Size: Large)   Pulse 72   Temp 98.1 F (36.7 C) (Oral)   Resp 16   Ht 5\' 2"  (1.575 m)   Wt 174 lb 3.2 oz (79 kg)   BMI 31.86 kg/m   Body mass index is 31.86 kg/m.  Advanced Directives 04/19/2018 03/23/2017 06/19/2015  Does Patient Have a Medical Advance Directive? No No No  Would patient like information on creating a medical advance directive? No - Patient declined Yes (MAU/Ambulatory/Procedural Areas - Information given) -    Tobacco Social History   Tobacco Use  Smoking Status Never Smoker  Smokeless Tobacco Never Used     Counseling given: Not Answered   Clinical Intake:  Pre-visit preparation completed: Yes  Pain : No/denies pain     Nutritional Status: BMI > 30  Obese Diabetes: Yes CBG done?: No Did pt. bring in CBG monitor from home?: No   Nutrition Risk Assessment:  Has the patient had any N/V/D within the last 2 months?  No  Does the patient have any non-healing wounds?  No  Has the patient had any unintentional weight loss or weight gain?  No   Diabetes:  Is the patient diabetic?  Yes  If diabetic, was a CBG obtained today?  No  Did the patient bring in their glucometer from home?  No  How often do you monitor your CBG's? Pt rarely checks her blood sugar.   Financial Strains and Diabetes Management:  Are you having any financial strains with the device, your supplies or your medication? No .  Does the patient want to be seen by Chronic Care Management for management of their diabetes?  No  Would the patient like to be referred to a Nutritionist or for Diabetic Management?  No   Diabetic  Exams:  Diabetic Eye Exam: Completed 09/01/17 negative for retinopathy.   Diabetic Foot Exam: Completed 04/09/18.   How often do you need to have someone help you when you read instructions, pamphlets, or other written materials from your doctor or pharmacy?: 1 - Never What is the last grade level you completed in school?: some college  Interpreter Needed?: No  Information entered by :: Clemetine Marker LPN  Past Medical History:  Diagnosis Date  . Diabetes mellitus without complication ([place])   . GERD (gastroesophageal reflux disease)   . Hypertension   . Seasonal allergies    Past Surgical History:  Procedure Laterality Date  . CATARACT EXTRACTION Right 08/2014  . CATARACT EXTRACTION Left 04/2016  . HAMMER TOE SURGERY    . PARTIAL HYSTERECTOMY     fibroids   Family History  Problem Relation Age of Onset  . Diabetes Mother   . Cancer Brother   . Diabetes Sister   . Cancer Brother   . Heart disease Brother   . Sudden death Brother   . Heart disease Brother    Social History   Socioeconomic History  . Marital status: Divorced    Spouse name: Not on file  . Number of children: Not on file  . Years of education: Not on file  . Highest education level: Some college, no degree  Occupational History  .  Occupation: retired  Scientific laboratory technician  . Financial resource strain: Somewhat hard  . Food insecurity:    Worry: Often true    Inability: Sometimes true  . Transportation needs:    Medical: No    Non-medical: No  Tobacco Use  . Smoking status: Never Smoker  . Smokeless tobacco: Never Used  Substance and Sexual Activity  . Alcohol use: No    Alcohol/week: 0.0 standard drinks  . Drug use: No  . Sexual activity: Not Currently  Lifestyle  . Physical activity:    Days per week: 0 days    Minutes per session: 0 min  . Stress: Patient refused  Relationships  . Social connections:    Talks on phone: More than three times a week    Gets together: More than three times a  week    Attends religious service: Never    Active member of club or organization: No    Attends meetings of clubs or organizations: Never    Relationship status: Divorced  Other Topics Concern  . Not on file  Social History Narrative  . Not on file    Outpatient Encounter Medications as of 04/19/2018  Medication Sig  . amLODipine (NORVASC) 2.5 MG tablet Take 1 tablet (2.5 mg total) by mouth daily.  [name] glimepiride (AMARYL) 2 MG tablet TAKE 1 TABLET (2 MG TOTAL) BY MOUTH DAILY BEFORE BREAKFAST.  [name] glucose blood (ONE TOUCH ULTRA TEST) test strip E11.9  . ibuprofen (ADVIL,MOTRIN) 600 MG tablet Take 1 tablet (600 mg total) by mouth every 8 (eight) hours as needed.  . Lancets (ONETOUCH ULTRASOFT) lancets Use as instructed  . levocetirizine (XYZAL) 5 MG tablet Take 1 tablet (5 mg total) by mouth every evening.  [name] losartan (COZAAR) 50 MG tablet Take 1 tablet (50 mg total) by mouth daily.  [name] omeprazole (PRILOSEC) 40 MG capsule Take 1 capsule (40 mg total) by mouth daily.  . simvastatin (ZOCOR) 10 MG tablet Take 1 tablet (10 mg total) by mouth at bedtime.  . traMADol (ULTRAM) 50 MG tablet Take 1 tablet (50 mg total) by mouth every 8 (eight) hours as needed.   No facility-administered encounter medications on file as of 04/19/2018.     Activities of Daily Living In your present state of health, do you have any difficulty performing the following activities: 04/19/2018 04/09/2018  Hearing? N N  Comment declines hearing aids -  Vision? N N  Comment wears reading glasses -  Difficulty concentrating or making decisions? N N  Walking or climbing stairs? N N  Dressing or bathing? N N  Doing errands, shopping? N N  Preparing Food and eating ? N -  Using the Toilet? N -  In the past six months, have you accidently leaked urine? N -  Do you have problems with loss of bowel control? N -  Managing your Medications? N -  Managing your Finances? N -  Housekeeping or managing your Housekeeping? N -   Some recent data might be hidden    Patient Care Team: [name], MD as PCP - General (Internal Medicine) Griffin Dakin (Optometry)    Assessment:   This is a routine wellness examination for Kathy Dougherty.  Exercise Activities and Dietary recommendations Current Exercise Habits: The patient does not participate in regular exercise at present, Exercise limited by: None identified  Goals    . Increase physical activity     Join Silver Sneakers and increase physical activity to 150 minutes per week.     [name]  Prevent Falls     Recommended to remove items from the home that may cause slips or trips.       Fall Risk Fall Risk  04/19/2018 04/09/2018 03/23/2017 03/20/2016  Falls in the past year? 0 0 Yes No  Number falls in past yr: 0 0 1 -  Injury with Fall? - 0 No -  Follow up - Falls evaluation completed Education provided;Falls prevention discussed -   FALL RISK PREVENTION PERTAINING TO THE HOME:  Any stairs in or around the home WITH handrails? No  Home free of loose throw rugs in walkways, pet beds, electrical cords, etc? Yes  Adequate lighting in your home to reduce risk of falls? Yes   ASSISTIVE DEVICES UTILIZED TO PREVENT FALLS:  Life alert? No  Use of a cane, walker or w/c? No  Grab bars in the bathroom? Yes  Shower chair or bench in shower? No  Elevated toilet seat or a handicapped toilet? No   DME ORDERS:  DME order needed?  No   TIMED UP AND GO:  Was the test performed? Yes .  Length of time to ambulate 10 feet: 6 sec.   GAIT:  Appearance of gait: Gait stead-fast and without the use of an assistive device.  Education: Fall risk prevention has been discussed.  Intervention(s) required? No    Depression Screen PHQ 2/9 Scores 04/19/2018 04/09/2018 03/26/2017 03/23/2017  PHQ - 2 Score 0 0 0 0  PHQ- 9 Score 0 0 - -     Cognitive Function Pt declined 6CIT     6CIT Screen 03/23/2017  What Year? 0 points  What month? 0 points  What time? 0 points  Count  back from 20 0 points  Months in reverse 0 points  Repeat phrase 2 points  Total Score 2    Immunization History  Administered Date(s) Administered  . Influenza, High Dose Seasonal PF 03/23/2017, 03/22/2018  . Influenza,inj,Quad PF,6+ Mos 03/19/2015, 03/20/2016  . Pneumococcal Conjugate-13 03/20/2016  . Pneumococcal Polysaccharide-23 03/23/2017  . Zoster 11/05/2012    Qualifies for Shingles Vaccine? Yes  Zostavax completed 11/05/2012. Due for Shingrix. Education has been provided regarding the importance of this vaccine. Pt has been advised to call insurance company to determine out of pocket expense. Advised may also receive vaccine at local pharmacy or Health Dept. Verbalized acceptance and understanding.  Tdap: Although this vaccine is not a covered service during a Wellness Exam, does the patient still wish to receive this vaccine today?  No .  Education has been provided regarding the importance of this vaccine. Advised may receive this vaccine at local pharmacy or Health Dept. Aware to provide a copy of the vaccination record if obtained from local pharmacy or Health Dept. Verbalized acceptance and understanding.  Flu Vaccine: Up to date   Pneumococcal Vaccine: Up to date   Screening Tests Health Maintenance  Topic Date Due  . TETANUS/TDAP  02/09/1970  . MAMMOGRAM  02/26/2018  . OPHTHALMOLOGY EXAM  09/02/2018  . HEMOGLOBIN A1C  10/08/2018  . FOOT EXAM  04/10/2019  . COLONOSCOPY  05/20/2022  . INFLUENZA VACCINE  Completed  . DEXA SCAN  Completed  . PNA vac Low Risk Adult  Completed  . Hepatitis C Screening  Addressed    Cancer Screenings:  Colorectal Screening: Completed 12/14/12. Repeat every 10 years;   Mammogram: Completed 02/26/17. Repeat every year; scheduled for Wed 04/21/18  Bone Density: Completed 08/18/17. Results reflect OSTEOPENIA,  Repeat every 2 years.  Lung Cancer Screening: (Low Dose CT Chest recommended if Age 41-80 years, 30 pack-year currently  smoking OR have quit w/in 15years.) does not qualify.     Additional Screening:  Hepatitis C Screening: does qualify; Completed 11/21/12  Vision Screening: Recommended annual ophthalmology exams for early detection of glaucoma and other disorders of the eye. Is the patient up to date with their annual eye exam?  Yes  Who is the provider or what is the name of the office in which the pt attends annual eye exams? Dr. Lucia Gaskins  Dental Screening: Recommended annual dental exams for proper oral hygiene  Community Resource Referral:  CRR required this visit?  Yes  - food insecurities, pt given list of local food banks and CRR sent for additional resources.      Plan:    I have personally reviewed and addressed the Medicare Annual Wellness questionnaire and have noted the following in the patient's chart:  A. Medical and social history B. Use of alcohol, tobacco or illicit drugs  C. Current medications and supplements D. Functional ability and status E.  Nutritional status F.  Physical activity G. Advance directives H. List of other physicians I.  Hospitalizations, surgeries, and ER visits in previous 12 months J.  [place] such as hearing and vision if needed, cognitive and depression L. Referrals and appointments   In addition, I have reviewed and discussed with patient certain preventive protocols, quality metrics, and best practice recommendations. A written personalized care plan for preventive services as well as general preventive health recommendations were provided to patient.   Signed,  Clemetine Marker, LPN Nurse Health Advisor   Nurse Notes: pt doing well overall, encouraged her to increase frequency to check blood sugar. Referral sent to C3 team for food insecurities but pt states she does not have issues paying for most other things, she is just on a budget.

## 2018-04-19 NOTE — Patient Instructions (Signed)
Kathy Dougherty , Thank you for taking time to come for your Medicare Wellness Visit. I appreciate your ongoing commitment to your health goals. Please review the following plan we discussed and let me know if I can assist you in the future.   Screening recommendations/referrals: Colonoscopy: done 12/14/12 repeat in 2024 Mammogram: scheduled for 04/21/18 Bone Density: done 08/18/17 Recommended yearly ophthalmology/optometry visit for glaucoma screening and checkup Recommended yearly dental visit for hygiene and checkup  Vaccinations: Influenza vaccine: done 03/22/18 Pneumococcal vaccine: done 03/23/17 Tdap vaccine: due please contact us if you get a cut or scrape Shingles vaccine: Shingrix discussed. Please contact your insurance company for coverage information.     Advanced directives: Advance directive discussed with you today. Even though you declined this today please call our office should you change your mind and we can give you the proper paperwork for you to fill out.  Conditions/risks identified: Geneticist, molecular through [employer] for gym membership to increase physical activity.   Next appointment: Please follow up in one year for your Medicare Annual Wellness visit.    [place] Assistance:  [facility] [phone] www.[email or web address] Services Include: Housing counseling, home ownership, senior nutrition, asset development, emergency food, crisis information and referral, self sufficiency, and LIEAP programs.  [place] on Wheels 9497114691 www.alamancemow.org Services Include: Home-delivered meals, frozen home-delivered meals, and winter emergency meals. (Note: Call for more information on additional supportive services.) 7 Category continues on next page. [employer] [phone]  www.[facility]iedchurches.org Services Include: Job and education services, shelter, meals and emergency food. Weekday  lunch and dinner meal program for shelter residents and community members (hot breakfast daily for shelter residents).  [employer] ([phone]  www. RecruitSuit.co.za Services Include: Building surveyor. [employer] [phone] [email or web address]/bam/[email or web address] Services Include: WEEKEND Meals Saturdays 11:30 am - 12:30 pm and Sundays 12:30 - 1:30 pm.  Division of Social Services Special Nutrition Assistance Program (SNAP) Food Stamps Program [phone]  843-013-4680 https://gibson.com/- food-stamps Services Include: Nutrition supportive programs (Note: Food Stamps.)  [facility] [place], [place] We offer both emergency food assistance and also have a monthly food assistance program. Our monthly program signup day and times are: Monday 10:00am - 12:00pm; 7:00pm - 9:00pmFriday 10:00am - 12:00pmFood distribution is base on first come first serve basis Call if you have any questions.   [place] [phone]  www.hbcburlington.net Services Include: Food pantry Wednesdays 10 am - Noon.  [employer] ([phone]  www.salvationarmy.org Services Include: Utility assistance, food pantry, life-sustaining medicine, and rental assistance (when funds are available  Preventive Care 65 Years and Older, Female Preventive care refers to lifestyle choices and visits with your health care provider that can promote health and wellness. What does preventive care include?  A yearly physical exam. This is also called an annual well check.  Dental exams once or twice a year.  Routine eye exams. Ask your health care provider how often you should have your eyes checked.  Personal lifestyle choices, including:  Daily care of your teeth and gums.  Regular physical activity.  Eating a healthy diet.  Avoiding tobacco and drug use.  Limiting alcohol  use.  Practicing safe sex.  Taking low-dose aspirin every day.  Taking vitamin and mineral supplements as recommended by your health care provider. What happens during an annual well check? The services and screenings done by your health care provider during your annual  well check will depend on your age, overall health, lifestyle risk factors, and family history of disease. Counseling  Your health care provider may ask you questions about your:  Alcohol use.  Tobacco use.  Drug use.  Emotional well-being.  Home and relationship well-being.  Sexual activity.  Eating habits.  History of falls.  Memory and ability to understand (cognition).  Work and work Statistician.  Reproductive health. Screening  You may have the following tests or measurements:  Height, weight, and BMI.  Blood pressure.  Lipid and cholesterol levels. These may be checked every 5 years, or more frequently if you are over 69 years old.  Skin check.  Lung cancer screening. You may have this screening every year starting at age 52 if you have a 30-pack-year history of smoking and currently smoke or have quit within the past 15 years.  Fecal occult blood test (FOBT) of the stool. You may have this test every year starting at age 66.  Flexible sigmoidoscopy or colonoscopy. You may have a sigmoidoscopy every 5 years or a colonoscopy every 10 years starting at age 53.  Hepatitis C blood test.  Hepatitis B blood test.  Sexually transmitted disease (STD) testing.  Diabetes screening. This is done by checking your blood sugar (glucose) after you have not eaten for a while (fasting). You may have this done every 1-3 years.  Bone density scan. This is done to screen for osteoporosis. You may have this done starting at age 29.  Mammogram. This may be done every 1-2 years. Talk to your health care provider about how often you should have regular mammograms. Talk with your health care provider about  your test results, treatment options, and if necessary, the need for more tests. Vaccines  Your health care provider may recommend certain vaccines, such as:  Influenza vaccine. This is recommended every year.  Tetanus, diphtheria, and acellular pertussis (Tdap, Td) vaccine. You may need a Td booster every 10 years.  Zoster vaccine. You may need this after age 60.  Pneumococcal 13-valent conjugate (PCV13) vaccine. One dose is recommended after age 32.  Pneumococcal polysaccharide (PPSV23) vaccine. One dose is recommended after age 4. Talk to your health care provider about which screenings and vaccines you need and how often you need them. This information is not intended to replace advice given to you by your health care provider. Make sure you discuss any questions you have with your health care provider. Document Released: 06/01/2015 Document Revised: 01/23/2016 Document Reviewed: 03/06/2015 Elsevier Interactive Patient Education  2017 [place] Prevention in the Home Falls can cause injuries. They can happen to people of all ages. There are many things you can do to make your home safe and to help prevent falls. What can I do on the outside of my home?  Regularly fix the edges of walkways and driveways and fix any cracks.  Remove anything that might make you trip as you walk through a door, such as a raised step or threshold.  Trim any bushes or trees on the path to your home.  Use bright outdoor lighting.  Clear any walking paths of anything that might make someone trip, such as rocks or tools.  Regularly check to see if handrails are loose or broken. Make sure that both sides of any steps have handrails.  Any raised decks and porches should have guardrails on the edges.  Have any leaves, snow, or ice cleared regularly.  Use sand or salt on walking  paths during winter.  Clean up any spills in your garage right away. This includes oil or grease spills. What  can I do in the bathroom?  Use night lights.  Install grab bars by the toilet and in the tub and shower. Do not use towel bars as grab bars.  Use non-skid mats or decals in the tub or shower.  If you need to sit down in the shower, use a plastic, non-slip stool.  Keep the floor dry. Clean up any water that spills on the floor as soon as it happens.  Remove soap buildup in the tub or shower regularly.  Attach bath mats securely with double-sided non-slip rug tape.  Do not have throw rugs and other things on the floor that can make you trip. What can I do in the bedroom?  Use night lights.  Make sure that you have a light by your bed that is easy to reach.  Do not use any sheets or blankets that are too big for your bed. They should not hang down onto the floor.  Have a firm chair that has side arms. You can use this for support while you get dressed.  Do not have throw rugs and other things on the floor that can make you trip. What can I do in the kitchen?  Clean up any spills right away.  Avoid walking on wet floors.  Keep items that you use a lot in easy-to-reach places.  If you need to reach something above you, use a strong step stool that has a grab bar.  Keep electrical cords out of the way.  Do not use floor polish or wax that makes floors slippery. If you must use wax, use non-skid floor wax.  Do not have throw rugs and other things on the floor that can make you trip. What can I do with my stairs?  Do not leave any items on the stairs.  Make sure that there are handrails on both sides of the stairs and use them. Fix handrails that are broken or loose. Make sure that handrails are as long as the stairways.  Check any carpeting to make sure that it is firmly attached to the stairs. Fix any carpet that is loose or worn.  Avoid having throw rugs at the top or bottom of the stairs. If you do have throw rugs, attach them to the floor with carpet tape.  Make sure  that you have a light switch at the top of the stairs and the bottom of the stairs. If you do not have them, ask someone to add them for you. What else can I do to help prevent falls?  Wear shoes that:  Do not have high heels.  Have rubber bottoms.  Are comfortable and fit you well.  Are closed at the toe. Do not wear sandals.  If you use a stepladder:  Make sure that it is fully opened. Do not climb a closed stepladder.  Make sure that both sides of the stepladder are locked into place.  Ask someone to hold it for you, if possible.  Clearly mark and make sure that you can see:  Any grab bars or handrails.  First and last steps.  Where the edge of each step is.  Use tools that help you move around (mobility aids) if they are needed. These include:  Canes.  Walkers.  Scooters.  Crutches.  Turn on the lights when you go into a dark area. Replace any  light bulbs as soon as they burn out.  Set up your furniture so you have a clear path. Avoid moving your furniture around.  If any of your floors are uneven, fix them.  If there are any pets around you, be aware of where they are.  Review your medicines with your doctor. Some medicines can make you feel dizzy. This can increase your chance of falling. Ask your doctor what other things that you can do to help prevent falls. This information is not intended to replace advice given to you by your health care provider. Make sure you discuss any questions you have with your health care provider. Document Released: 03/01/2009 Document Revised: 10/11/2015 Document Reviewed: 06/09/2014 Elsevier Interactive Patient Education  2017 [employer].

## 2018-04-21 DIAGNOSIS — Z1231 Encounter for screening mammogram for malignant neoplasm of breast: Secondary | ICD-10-CM | POA: Diagnosis not present

## 2018-05-29 ENCOUNTER — Other Ambulatory Visit: Payer: Self-pay | Admitting: Internal Medicine

## 2018-05-29 DIAGNOSIS — I10 Essential (primary) hypertension: Secondary | ICD-10-CM

## 2018-06-21 ENCOUNTER — Other Ambulatory Visit: Payer: Self-pay

## 2018-06-21 MED ORDER — IBUPROFEN 600 MG PO TABS: 600.0000 mg | ORAL_TABLET | Freq: Three times a day (TID) | ORAL | 1 refills | Status: DC | PRN

## 2018-07-26 ENCOUNTER — Other Ambulatory Visit: Payer: Self-pay | Admitting: Internal Medicine

## 2018-07-26 DIAGNOSIS — E119 Type 2 diabetes mellitus without complications: Secondary | ICD-10-CM

## 2018-08-02 DIAGNOSIS — R69 Illness, unspecified: Secondary | ICD-10-CM | POA: Diagnosis not present

## 2018-08-11 ENCOUNTER — Ambulatory Visit (INDEPENDENT_AMBULATORY_CARE_PROVIDER_SITE_OTHER): Payer: Medicare HMO | Admitting: Internal Medicine

## 2018-08-11 ENCOUNTER — Encounter: Payer: Self-pay | Admitting: Internal Medicine

## 2018-08-11 ENCOUNTER — Other Ambulatory Visit: Payer: Self-pay

## 2018-08-11 VITALS — BP 128/82 | HR 78 | Ht 62.0 in | Wt 176.0 lb

## 2018-08-11 DIAGNOSIS — L851 Acquired keratosis [keratoderma] palmaris et plantaris: Secondary | ICD-10-CM | POA: Diagnosis not present

## 2018-08-11 DIAGNOSIS — E118 Type 2 diabetes mellitus with unspecified complications: Secondary | ICD-10-CM

## 2018-08-11 DIAGNOSIS — R0981 Nasal congestion: Secondary | ICD-10-CM | POA: Diagnosis not present

## 2018-08-11 DIAGNOSIS — I10 Essential (primary) hypertension: Secondary | ICD-10-CM | POA: Diagnosis not present

## 2018-08-11 MED ORDER — GLIMEPIRIDE 2 MG PO TABS: 2.0000 mg | ORAL_TABLET | Freq: Every day | ORAL | 3 refills | Status: DC

## 2018-08-11 MED ORDER — FLUTICASONE PROPIONATE 50 MCG/ACT NA SUSP: 2.0000 | Freq: Every day | NASAL | 6 refills | Status: DC

## 2018-08-11 MED ORDER — AMLODIPINE BESYLATE 2.5 MG PO TABS: 2.5000 mg | ORAL_TABLET | Freq: Every day | ORAL | 3 refills | Status: DC

## 2018-08-11 NOTE — Progress Notes (Signed)
Date:  08/11/2018   Name:  Kathy Dougherty   DOB:  [birth year]   MRN:  [id number]   Chief Complaint: Diabetes (4 month follow up. ); Hypertension; and Dizziness (On march 14th she almost passed out. Ferlt like she went into shock after hearing news that someone she knew had passed away. Rescue came and BP 153/110. And BS was 148. )  Hypertension  This is a chronic problem. The problem is controlled. Pertinent negatives include no chest pain, headaches, palpitations or shortness of breath. Risk factors for coronary artery disease include diabetes mellitus. Past treatments include angiotensin blockers and calcium channel blockers. The current treatment provides significant improvement. There are no compliance problems.   Diabetes  She presents for her follow-up diabetic visit. She has type 2 diabetes mellitus. Her disease course has been stable. Pertinent negatives for hypoglycemia include no dizziness, headaches or tremors. Associated symptoms include foot paresthesias. Pertinent negatives for diabetes include no chest pain, no fatigue and no polyuria. Symptoms are stable. Current diabetic treatment includes oral agent (monotherapy). She is following a generally healthy diet. She monitors blood glucose at home 1-2 x per week. There is no change in her home blood glucose trend. Her breakfast blood glucose is taken between 6-7 am. Her breakfast blood glucose range is generally 130-140 mg/dl. An ACE inhibitor/angiotensin II receptor blocker is being taken. Eye exam is current.  Sinus Problem  This is a recurrent problem. There has been no fever. Associated symptoms include congestion and sinus pressure. Pertinent negatives include no coughing, headaches or shortness of breath. Treatments tried: xyzal, singulair, mucinex, flonase. Improvement on treatment: on xyzal seems to help.  Foot problem - she has thick skin and callus on the soles of both feet.  She was seen by Podiatry in 2018 - they did nail  debridement but did not address her skin which is what bothers her the most.  Lab Results  Component Value Date   HGBA1C 7.1 (H) 04/09/2018   Lab Results  Component Value Date   CREATININE 0.70 04/09/2018   BUN 9 04/09/2018   NA 140 04/09/2018   K 3.9 04/09/2018   CL 103 04/09/2018   CO2 23 04/09/2018    Review of Systems  Constitutional: Negative for appetite change, fatigue, fever and unexpected weight change.  HENT: Positive for congestion and sinus pressure. Negative for tinnitus and trouble swallowing.   Eyes: Negative for visual disturbance.  Respiratory: Negative for cough, chest tightness and shortness of breath.   Cardiovascular: Negative for chest pain, palpitations and leg swelling.  Gastrointestinal: Negative for abdominal pain and constipation.  Endocrine: Negative for polyuria.  Genitourinary: Negative for dysuria and hematuria.  Musculoskeletal: Negative for arthralgias.  Skin:       Skin thickening on soles of feet   Neurological: Negative for dizziness, tremors, numbness and headaches.  Hematological: Negative for adenopathy.  Psychiatric/Behavioral: Negative for dysphoric mood and sleep disturbance.    Patient Active Problem List   Diagnosis Date Noted   Hyperlipidemia associated with type 2 diabetes mellitus ([place]) 03/26/2017   Iritis of left eye 03/26/2017   Pre-ulcerative calluses 03/26/2017   Arthritis of right knee 01/15/2016   Controlled type 2 diabetes mellitus without complication, without long-term current use of insulin ([place]) 03/15/2015   Abnormal LFTs 03/15/2015   Essential (primary) hypertension 03/15/2015   Gastro-esophageal reflux disease without esophagitis 03/15/2015   Allergic rhinitis, seasonal 03/15/2015    Allergies  Allergen Reactions   Ace Inhibitors Cough  Metformin And Related Diarrhea   Penicillins Rash   Sulfa Antibiotics Rash    Past Surgical History:  Procedure Laterality Date   CATARACT EXTRACTION  Right 08/2014   CATARACT EXTRACTION Left 04/2016   HAMMER TOE SURGERY     PARTIAL HYSTERECTOMY     fibroids    Social History   Tobacco Use   Smoking status: Never Smoker   Smokeless tobacco: Never Used  Substance Use Topics   Alcohol use: No    Alcohol/week: 0.0 standard drinks   Drug use: No     Medication list has been reviewed and updated.  Current Meds  Medication Sig   amLODipine (NORVASC) 2.5 MG tablet Take 1 tablet (2.5 mg total) by mouth daily.   glimepiride (AMARYL) 2 MG tablet TAKE 1 TABLET (2 MG TOTAL) BY MOUTH DAILY BEFORE BREAKFAST.   glucose blood (ONE TOUCH ULTRA TEST) test strip E11.9   ibuprofen (ADVIL,MOTRIN) 600 MG tablet Take 1 tablet (600 mg total) by mouth every 8 (eight) hours as needed.   Lancets (ONETOUCH ULTRASOFT) lancets Use as instructed   levocetirizine (XYZAL) 5 MG tablet Take 1 tablet (5 mg total) by mouth every evening.   losartan (COZAAR) 50 MG tablet TAKE 1 TABLET BY MOUTH EVERY DAY   omeprazole (PRILOSEC) 40 MG capsule Take 1 capsule (40 mg total) by mouth daily.   simvastatin (ZOCOR) 10 MG tablet TAKE 1 TABLET BY MOUTH EVERYDAY AT BEDTIME    PHQ 2/9 Scores 08/11/2018 04/19/2018 04/09/2018 03/26/2017  PHQ - 2 Score 0 0 0 0  PHQ- 9 Score - 0 0 -    Physical Exam Vitals signs and nursing note reviewed.  Constitutional:      General: She is not in acute distress.    Appearance: She is well-developed.  HENT:     Head: Normocephalic and atraumatic.     Nose:     Right Sinus: Frontal sinus tenderness present.     Left Sinus: Frontal sinus tenderness present.     Mouth/Throat:     Mouth: Mucous membranes are moist.  Eyes:     Pupils: Pupils are equal, round, and reactive to light.  Neck:     Musculoskeletal: Normal range of motion and neck supple.  Cardiovascular:     Rate and Rhythm: Normal rate and regular rhythm.     Pulses:          Dorsalis pedis pulses are 2+ on the right side and 2+ on the left side.        Posterior tibial pulses are 2+ on the right side and 2+ on the left side.     Heart sounds: Normal heart sounds.  Pulmonary:     Effort: Pulmonary effort is normal. No respiratory distress.     Breath sounds: Normal breath sounds and air entry. No wheezing or rales.  Musculoskeletal: Normal range of motion.     Right lower leg: No edema.     Left lower leg: No edema.  Lymphadenopathy:     Cervical: No cervical adenopathy.  Skin:    General: Skin is warm and dry.     Findings: No rash.  Neurological:     Mental Status: She is alert and oriented to person, place, and time.  Psychiatric:        Behavior: Behavior normal.        Thought Content: Thought content normal.     Wt Readings from Last 3 Encounters:  08/11/18 176 lb (79.8 kg)  04/19/18 174 lb 3.2 oz (79 kg)  04/09/18 178 lb (80.7 kg)    BP 128/82    Pulse 78    Ht 5\' 2"  (1.575 m)    Wt 176 lb (79.8 kg)    SpO2 99%    BMI 32.19 kg/m   Assessment and Plan: 1. Essential (primary) hypertension controlled - amLODipine (NORVASC) 2.5 MG tablet; Take 1 tablet (2.5 mg total) by mouth daily.  Dispense: 90 tablet; Refill: 3 - Comprehensive metabolic panel  2. DM (diabetes mellitus), type 2 with complications ([place]) Continue current oral therapy, diet and exercise - glimepiride (AMARYL) 2 MG tablet; Take 1 tablet (2 mg total) by mouth daily before breakfast.  Dispense: 90 tablet; Refill: 3 - Hemoglobin A1c - Ambulatory referral to Podiatry  3. Nasal sinus congestion Continue xyzal, add flonase - fluticasone (FLONASE) 50 MCG/ACT nasal spray; Place 2 sprays into both nostrils daily.  Dispense: 16 g; Refill: 6  4. Keratoderma, acquired Becoming more symptomatic - Ambulatory referral to Podiatry   Partially dictated using Editor, commissioning. Any errors are unintentional.  [name], MD [facility]  08/11/2018

## 2018-08-12 LAB — COMPREHENSIVE METABOLIC PANEL
ALT: 20 IU/L (ref 0–32)
AST: 23 IU/L (ref 0–40)
Albumin/Globulin Ratio: 1.4 (ref 1.2–2.2)
Albumin: 4.2 g/dL (ref 3.8–4.8)
Alkaline Phosphatase: 83 IU/L (ref 39–117)
BUN/Creatinine Ratio: 12 (ref 12–28)
BUN: 8 mg/dL (ref 8–27)
Bilirubin Total: 0.6 mg/dL (ref 0.0–1.2)
CO2: 23 mmol/L (ref 20–29)
Calcium: 9.1 mg/dL (ref 8.7–10.3)
Chloride: 102 mmol/L (ref 96–106)
Creatinine, Ser: 0.68 mg/dL (ref 0.57–1.00)
GFR calc Af Amer: 105 mL/min/{1.73_m2} (ref >59)
GFR calc non Af Amer: 91 mL/min/{1.73_m2} (ref >59)
Globulin, Total: 3 g/dL (ref 1.5–4.5)
Glucose: 173 mg/dL — ABNORMAL HIGH (ref 65–99)
Potassium: 3.8 mmol/L (ref 3.5–5.2)
Sodium: 140 mmol/L (ref 134–144)
Total Protein: 7.2 g/dL (ref 6.0–8.5)

## 2018-08-12 LAB — HEMOGLOBIN A1C
Est. average glucose Bld gHb Est-mCnc: 151 mg/dL
Hgb A1c MFr Bld: 6.9 % — ABNORMAL HIGH (ref 4.8–5.6)

## 2018-08-16 ENCOUNTER — Other Ambulatory Visit: Payer: Self-pay | Admitting: Internal Medicine

## 2018-08-18 ENCOUNTER — Ambulatory Visit: Payer: Self-pay | Admitting: Podiatry

## 2018-10-04 ENCOUNTER — Encounter: Payer: Self-pay | Admitting: Internal Medicine

## 2018-10-04 ENCOUNTER — Other Ambulatory Visit: Payer: Self-pay

## 2018-10-04 ENCOUNTER — Ambulatory Visit (INDEPENDENT_AMBULATORY_CARE_PROVIDER_SITE_OTHER): Payer: Medicare HMO | Admitting: Internal Medicine

## 2018-10-04 VITALS — BP 122/80 | HR 76 | Temp 98.1°F | Ht 62.0 in | Wt 176.0 lb

## 2018-10-04 DIAGNOSIS — J0100 Acute maxillary sinusitis, unspecified: Secondary | ICD-10-CM

## 2018-10-04 MED ORDER — DOXYCYCLINE HYCLATE 100 MG PO TABS: 100.0000 mg | ORAL_TABLET | Freq: Two times a day (BID) | ORAL | 0 refills | Status: AC

## 2018-10-04 NOTE — Progress Notes (Signed)
Date:  10/04/2018   Name:  Kathy Dougherty   DOB:  October 29, 1950   MRN:  [id number]   Chief Complaint: Sinusitis (X 3 weeks. Not going away on its own. Yellow mucous. Cough mostly at night because of drainage. Pressure and pain in face. )  Sinusitis  This is a new problem. The current episode started in the past 7 days. The problem is unchanged. There has been no fever. Associated symptoms include congestion, coughing, headaches, sinus pressure and a sore throat. Pertinent negatives include no chills or shortness of breath.    Review of Systems  Constitutional: Positive for fatigue. Negative for chills, fever and unexpected weight change.  HENT: Positive for congestion, sinus pressure and sore throat. Negative for trouble swallowing.   Respiratory: Positive for cough. Negative for shortness of breath and wheezing.   Cardiovascular: Negative for chest pain and palpitations.  Neurological: Positive for headaches.    Patient Active Problem List   Diagnosis Date Noted  . Hyperlipidemia associated with type 2 diabetes mellitus ([place]) 03/26/2017  . 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 Pre-ulcerative calluses 03/26/2017  . Arthritis of right knee 01/15/2016  . DM (diabetes mellitus), type 2 with complications ([place]) 09/62/8366  . Abnormal LFTs 03/15/2015  . Essential (primary) hypertension 03/15/2015  . Gastro-esophageal reflux disease without esophagitis 03/15/2015  . Allergic rhinitis, seasonal 03/15/2015    Allergies  Allergen Reactions  . Ace Inhibitors Cough  . Metformin And Related Diarrhea  . Penicillins Rash  . Sulfa Antibiotics Rash    Past Surgical History:  Procedure Laterality Date  . CATARACT EXTRACTION Right 08/2014  . CATARACT EXTRACTION Left 04/2016  . HAMMER TOE SURGERY    . PARTIAL HYSTERECTOMY     fibroids    Social History   Tobacco Use  . Smoking status: Never Smoker  . Smokeless tobacco: Never Used  Substance Use Topics  . Alcohol use: No   Alcohol/week: 0.0 standard drinks  . Drug use: No     Medication list has been reviewed and updated.  Current Meds  Medication Sig  . amLODipine (NORVASC) 2.5 MG tablet Take 1 tablet (2.5 mg total) by mouth daily.  . fluticasone (FLONASE) 50 MCG/ACT nasal spray Place 2 sprays into both nostrils daily.  [name] glimepiride (AMARYL) 2 MG tablet Take 1 tablet (2 mg total) by mouth daily before breakfast.  . glucose blood (ONE TOUCH ULTRA TEST) test strip E11.9  . ibuprofen (ADVIL,MOTRIN) 600 MG tablet TAKE 1 TABLET (600 MG TOTAL) BY MOUTH EVERY 8 (EIGHT) HOURS AS NEEDED.  [name] Lancets (ONETOUCH ULTRASOFT) lancets Use as instructed  . levocetirizine (XYZAL) 5 MG tablet Take 1 tablet (5 mg total) by mouth every evening.  [name] losartan (COZAAR) 50 MG tablet TAKE 1 TABLET BY MOUTH EVERY DAY  . omeprazole (PRILOSEC) 40 MG capsule Take 1 capsule (40 mg total) by mouth daily.  . simvastatin (ZOCOR) 10 MG tablet TAKE 1 TABLET BY MOUTH EVERYDAY AT BEDTIME  . traMADol (ULTRAM) 50 MG tablet Take 1 tablet (50 mg total) by mouth every 8 (eight) hours as needed.    PHQ 2/9 Scores 10/04/2018 08/11/2018 04/19/2018 04/09/2018  PHQ - 2 Score 0 0 0 0  PHQ- 9 Score - - 0 0    BP Readings from Last 3 Encounters:  10/04/18 122/80  08/11/18 128/82  04/19/18 140/88    Physical Exam Constitutional:      Appearance: She is well-developed.  HENT:     Right Ear: Ear  canal and external ear normal. Tympanic membrane is not erythematous or retracted.     Left Ear: Ear canal and external ear normal. Tympanic membrane is not erythematous or retracted.     Nose:     Right Sinus: Maxillary sinus tenderness present. No frontal sinus tenderness.     Left Sinus: Maxillary sinus tenderness present. No frontal sinus tenderness.     Mouth/Throat:     Mouth: No oral lesions.     Pharynx: Uvula midline. No oropharyngeal exudate or posterior oropharyngeal erythema.  Cardiovascular:     Rate and Rhythm: Normal rate and regular  rhythm.     Heart sounds: Normal heart sounds.  Pulmonary:     Breath sounds: Normal breath sounds. No wheezing or rales.  Lymphadenopathy:     Cervical: No cervical adenopathy.  Neurological:     Mental Status: She is alert and oriented to person, place, and time.     Wt Readings from Last 3 Encounters:  10/04/18 176 lb (79.8 kg)  08/11/18 176 lb (79.8 kg)  04/19/18 174 lb 3.2 oz (79 kg)    BP 122/80   Pulse 76   Temp 98.1 F (36.7 C) (Oral)   Ht 5\' 2"  (1.575 m)   Wt 176 lb (79.8 kg)   SpO2 96%   BMI 32.19 kg/m   Assessment and Plan: 1. Acute non-recurrent maxillary sinusitis Continue xyzal, use Afrin if needed once a day - doxycycline (VIBRA-TABS) 100 MG tablet; Take 1 tablet (100 mg total) by mouth 2 (two) times daily for 10 days.  Dispense: 20 tablet; Refill: 0   Partially dictated using Editor, commissioning. Any errors are unintentional.  [name], MD [facility]  10/04/2018

## 2018-10-15 ENCOUNTER — Other Ambulatory Visit: Payer: Self-pay | Admitting: Internal Medicine

## 2018-10-15 DIAGNOSIS — J3089 Other allergic rhinitis: Secondary | ICD-10-CM

## 2018-10-18 ENCOUNTER — Telehealth: Payer: Self-pay

## 2018-10-18 ENCOUNTER — Other Ambulatory Visit: Payer: Self-pay

## 2018-10-18 MED ORDER — FLUCONAZOLE 100 MG PO TABS: 100.0000 mg | ORAL_TABLET | Freq: Every day | ORAL | 0 refills | Status: AC

## 2018-10-18 NOTE — Telephone Encounter (Signed)
Patient called and said she is having a yeast infection after takign her abx for sinus infection.  Sent in diflucan to her pharmacy. Unable to reach her to inform her. Mailbox not set up to leave VM.

## 2018-11-08 DIAGNOSIS — E119 Type 2 diabetes mellitus without complications: Secondary | ICD-10-CM | POA: Diagnosis not present

## 2018-11-08 DIAGNOSIS — Z961 Presence of intraocular lens: Secondary | ICD-10-CM | POA: Diagnosis not present

## 2018-11-17 ENCOUNTER — Ambulatory Visit (INDEPENDENT_AMBULATORY_CARE_PROVIDER_SITE_OTHER): Payer: Medicare HMO

## 2018-11-17 ENCOUNTER — Other Ambulatory Visit: Payer: Self-pay

## 2018-11-17 ENCOUNTER — Encounter: Payer: Self-pay | Admitting: Podiatry

## 2018-11-17 ENCOUNTER — Ambulatory Visit (INDEPENDENT_AMBULATORY_CARE_PROVIDER_SITE_OTHER): Payer: Medicare HMO | Admitting: Podiatry

## 2018-11-17 VITALS — Temp 98.7°F

## 2018-11-17 DIAGNOSIS — M2041 Other hammer toe(s) (acquired), right foot: Secondary | ICD-10-CM

## 2018-11-17 DIAGNOSIS — M2042 Other hammer toe(s) (acquired), left foot: Secondary | ICD-10-CM

## 2018-11-17 DIAGNOSIS — Q828 Other specified congenital malformations of skin: Secondary | ICD-10-CM | POA: Diagnosis not present

## 2018-11-17 DIAGNOSIS — E1142 Type 2 diabetes mellitus with diabetic polyneuropathy: Secondary | ICD-10-CM | POA: Diagnosis not present

## 2018-11-17 LAB — HM DIABETES EYE EXAM

## 2018-11-17 NOTE — Progress Notes (Signed)
Subjective:  Patient ID: Kathy Dougherty, female    DOB: [birth year],  MRN: [id number] HPI Chief Complaint  Patient presents with   Callouses    i have some places on the bottom of both feet     68 y.o. female presents with the above complaint.   ROS: Denies fever chills nausea vomiting muscle aches pains calf pain back pain chest pain shortness of breath.  Past Medical History:  Diagnosis Date   Diabetes mellitus without complication (HCC)    GERD (gastroesophageal reflux disease)    Hypertension    Seasonal allergies    Past Surgical History:  Procedure Laterality Date   CATARACT EXTRACTION Right 08/2014   CATARACT EXTRACTION Left 04/2016   HAMMER TOE SURGERY     PARTIAL HYSTERECTOMY     fibroids    Current Outpatient Medications:    amLODipine (NORVASC) 2.5 MG tablet, Take 1 tablet (2.5 mg total) by mouth daily., Disp: 90 tablet, Rfl: 3   fluticasone (FLONASE) 50 MCG/ACT nasal spray, Place 2 sprays into both nostrils daily., Disp: 16 g, Rfl: 6   glimepiride (AMARYL) 2 MG tablet, Take 1 tablet (2 mg total) by mouth daily before breakfast., Disp: 90 tablet, Rfl: 3   glucose blood (ONE TOUCH ULTRA TEST) test strip, E11.9, Disp: 100 each, Rfl: 12   ibuprofen (ADVIL,MOTRIN) 600 MG tablet, TAKE 1 TABLET (600 MG TOTAL) BY MOUTH EVERY 8 (EIGHT) HOURS AS NEEDED., Disp: 90 tablet, Rfl: 1   Lancets (ONETOUCH ULTRASOFT) lancets, Use as instructed, Disp: 100 each, Rfl: 12   levocetirizine (XYZAL) 5 MG tablet, TAKE 1 TABLET BY MOUTH EVERY DAY IN THE EVENING, Disp: 90 tablet, Rfl: 1   losartan (COZAAR) 50 MG tablet, TAKE 1 TABLET BY MOUTH EVERY DAY, Disp: 90 tablet, Rfl: 3   omeprazole (PRILOSEC) 40 MG capsule, Take 1 capsule (40 mg total) by mouth daily., Disp: 90 capsule, Rfl: 3   simvastatin (ZOCOR) 10 MG tablet, TAKE 1 TABLET BY MOUTH EVERYDAY AT BEDTIME, Disp: 90 tablet, Rfl: 3   traMADol (ULTRAM) 50 MG tablet, Take 1 tablet (50 mg total) by mouth every 8 (eight)  hours as needed., Disp: 60 tablet, Rfl: 2  Allergies  Allergen Reactions   Ace Inhibitors Cough   Metformin And Related Diarrhea   Penicillins Rash   Sulfa Antibiotics Rash   Review of Systems Objective:   Vitals:   11/17/18 0948  Temp: 98.7 F (37.1 C)    General: Well developed, nourished, in no acute distress, alert and oriented x3   Dermatological: Skin is warm, dry and supple bilateral. Nails x 10 are well maintained; remaining integument appears unremarkable at this time. There are no open sores, no preulcerative lesions, no rash or signs of infection present.  Acquire diabetic keratoderma forefoot bilateral multiple porokeratotic lesions and diffuse calluses.  No open lesions or wounds.  Vascular: Dorsalis Pedis artery and Posterior Tibial artery pedal pulses are 2/4 bilateral with immedate capillary fill time. Pedal hair growth present. No varicosities and no lower extremity edema present bilateral.   Neruologic: Grossly intact via light touch bilateral. Vibratory intact via tuning fork bilateral. Protective threshold with Semmes Wienstein monofilament diminished to all pedal sites bilateral. Patellar and Achilles deep tendon reflexes 2+ bilateral. No Babinski or clonus noted bilateral.   Musculoskeletal: No gross boney pedal deformities bilateral. No pain, crepitus, or limitation noted with foot and ankle range of motion bilateral. Muscular strength 5/5 in all groups tested bilateral.  Flexible hammertoe deformities bilateral.  Gait:  Unassisted, Nonantalgic.    Radiographs:  Radiographs taken today do not demonstrate any type of osseous abnormalities.  No acute findings.    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  Plan: Debrided all reactive hyperkeratotic tissue plantar aspect of the forefoot bilaterally.  Greater than 10 lesions.     Kamarri Fischetti T. [place], [place]

## 2018-11-26 ENCOUNTER — Other Ambulatory Visit: Payer: Self-pay

## 2018-11-26 ENCOUNTER — Ambulatory Visit (INDEPENDENT_AMBULATORY_CARE_PROVIDER_SITE_OTHER): Payer: Medicare HMO | Admitting: Internal Medicine

## 2018-11-26 ENCOUNTER — Encounter: Payer: Self-pay | Admitting: Internal Medicine

## 2018-11-26 VITALS — BP 140/81 | HR 70 | Resp 16 | Ht 62.0 in | Wt 180.2 lb

## 2018-11-26 DIAGNOSIS — E118 Type 2 diabetes mellitus with unspecified complications: Secondary | ICD-10-CM

## 2018-11-26 DIAGNOSIS — K219 Gastro-esophageal reflux disease without esophagitis: Secondary | ICD-10-CM

## 2018-11-26 NOTE — Patient Instructions (Signed)
Elevated your upper body while sleeping on a wedge pillow or using an adjustable bed Take omeprazole for reflux daily  Gastroesophageal Reflux Disease, Adult Gastroesophageal reflux (GER) happens when acid from the stomach flows up into the tube that connects the mouth and the stomach (esophagus). Normally, food travels down the esophagus and stays in the stomach to be digested. With GER, food and stomach acid sometimes move back up into the esophagus. You may have a disease called gastroesophageal reflux disease (GERD) if the reflux:  Happens often.  Causes frequent or very bad symptoms.  Causes problems such as damage to the esophagus. When this happens, the esophagus becomes sore and swollen (inflamed). Over time, GERD can make small holes (ulcers) in the lining of the esophagus. What are the causes? This condition is caused by a problem with the muscle between the esophagus and the stomach. When this muscle is weak or not normal, it does not close properly to keep food and acid from coming back up from the stomach. The muscle can be weak because of:  Tobacco use.  Pregnancy.  Having a certain type of hernia (hiatal hernia).  Alcohol use.  Certain foods and drinks, such as coffee, chocolate, onions, and peppermint. What increases the risk? You are more likely to develop this condition if you:  Are overweight.  Have a disease that affects your connective tissue.  Use NSAID medicines. What are the signs or symptoms? Symptoms of this condition include:  Heartburn.  Difficult or painful swallowing.  The feeling of having a lump in the throat.  A bitter taste in the mouth.  Bad breath.  Having a lot of saliva.  Having an upset or bloated stomach.  Belching.  Chest pain. Different conditions can cause chest pain. Make sure you see your doctor if you have chest pain.  Shortness of breath or noisy breathing (wheezing).  Ongoing (chronic) cough or a cough at night.   Wearing away of the surface of teeth (tooth enamel).  Weight loss. How is this treated? Treatment will depend on how bad your symptoms are. Your doctor may suggest:  Changes to your diet.  Medicine.  Surgery. Follow these instructions at home: Eating and drinking   Follow a diet as told by your doctor. You may need to avoid foods and drinks such as: ? Coffee and tea (with or without caffeine). ? Drinks that contain alcohol. ? Energy drinks and sports drinks. ? Bubbly (carbonated) drinks or sodas. ? Chocolate and cocoa. ? Peppermint and mint flavorings. ? Garlic and onions. ? Horseradish. ? Spicy and acidic foods. These include peppers, chili powder, curry powder, vinegar, hot sauces, and BBQ sauce. ? Citrus fruit juices and citrus fruits, such as oranges, lemons, and limes. ? Tomato-based foods. These include red sauce, chili, salsa, and pizza with red sauce. ? Fried and fatty foods. These include donuts, french fries, potato chips, and high-fat dressings. ? High-fat meats. These include hot dogs, rib eye steak, sausage, ham, and bacon. ? High-fat dairy items, such as whole milk, butter, and cream cheese.  Eat small meals often. Avoid eating large meals.  Avoid drinking large amounts of liquid with your meals.  Avoid eating meals during the 2-3 hours before bedtime.  Avoid lying down right after you eat.  Do not exercise right after you eat. Lifestyle   Do not use any products that contain nicotine or tobacco. These include cigarettes, e-cigarettes, and chewing tobacco. If you need help quitting, ask your doctor.  Try  to lower your stress. If you need help doing this, ask your doctor.  If you are overweight, lose an amount of weight that is healthy for you. Ask your doctor about a safe weight loss goal. General instructions  Pay attention to any changes in your symptoms.  Take over-the-counter and prescription medicines only as told by your doctor. Do not take  aspirin, ibuprofen, or other NSAIDs unless your doctor says it is okay.  Wear loose clothes. Do not wear anything tight around your waist.  Raise (elevate) the head of your bed about 6 inches (15 cm).  Avoid bending over if this makes your symptoms worse.  Keep all follow-up visits as told by your doctor. This is important. Contact a doctor if:  You have new symptoms.  You lose weight and you do not know why.  You have trouble swallowing or it hurts to swallow.  You have wheezing or a cough that keeps happening.  Your symptoms do not get better with treatment.  You have a hoarse voice. Get help right away if:  You have pain in your arms, neck, jaw, teeth, or back.  You feel sweaty, dizzy, or light-headed.  You have chest pain or shortness of breath.  You throw up (vomit) and your throw-up looks like blood or coffee grounds.  You pass out (faint).  Your poop (stool) is bloody or black.  You cannot swallow, drink, or eat. Summary  If a person has gastroesophageal reflux disease (GERD), food and stomach acid move back up into the esophagus and cause symptoms or problems such as damage to the esophagus.  Treatment will depend on how bad your symptoms are.  Follow a diet as told by your doctor.  Take all medicines only as told by your doctor. This information is not intended to replace advice given to you by your health care provider. Make sure you discuss any questions you have with your health care provider. Document Released: 10/22/2007 Document Revised: 11/11/2017 Document Reviewed: 11/11/2017 Elsevier Patient Education  2020 [employer].

## 2018-11-26 NOTE — Progress Notes (Signed)
Date:  11/26/2018   Name:  Kathy Dougherty   DOB:  05-21-1950   MRN:  [id number]   Chief Complaint: Diabetes  Diabetes She presents for her follow-up diabetic visit. She has type 2 diabetes mellitus. Her disease course has been stable. Pertinent negatives for hypoglycemia include no headaches or tremors. Pertinent negatives for diabetes include no chest pain, no fatigue, no polydipsia and no polyuria. Symptoms are stable. Current diabetic treatment includes oral agent (monotherapy). She is compliant with treatment all of the time. An ACE inhibitor/angiotensin II receptor blocker is being taken. She sees a podiatrist.Eye exam is current.  Gastroesophageal Reflux She complains of coughing (during the night). She reports no abdominal pain or no chest pain. This is a recurrent problem. Pertinent negatives include no fatigue.   Lab Results  Component Value Date   HGBA1C 6.9 (H) 08/11/2018    Review of Systems  Constitutional: Negative for appetite change, chills, fatigue, fever and unexpected weight change.  HENT: Negative for tinnitus and trouble swallowing.   Eyes: Negative for visual disturbance.  Respiratory: Positive for cough (during the night). Negative for chest tightness and shortness of breath.   Cardiovascular: Negative for chest pain, palpitations and leg swelling.  Gastrointestinal: Negative for abdominal pain.  Endocrine: Negative for polydipsia and polyuria.  Genitourinary: Negative for dysuria and hematuria.  Musculoskeletal: Negative for arthralgias.  Neurological: Positive for numbness (in both feet, seen by Podiatry). Negative for tremors and headaches.  Psychiatric/Behavioral: Negative for dysphoric mood.    Patient Active Problem List   Diagnosis Date Noted  . Hyperlipidemia associated with type 2 diabetes mellitus ([place]) 03/26/2017  . 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 Pre-ulcerative calluses 03/26/2017  . Arthritis of right knee 01/15/2016  . DM (diabetes  mellitus), type 2 with complications ([place]) 12/45/8099  . Abnormal LFTs 03/15/2015  . Essential (primary) hypertension 03/15/2015  . Gastro-esophageal reflux disease without esophagitis 03/15/2015  . Allergic rhinitis, seasonal 03/15/2015    Allergies  Allergen Reactions  . Ace Inhibitors Cough  . Metformin And Related Diarrhea  . Penicillins Rash  . Sulfa Antibiotics Rash    Past Surgical History:  Procedure Laterality Date  . CATARACT EXTRACTION Right 08/2014  . CATARACT EXTRACTION Left 04/2016  . HAMMER TOE SURGERY    . PARTIAL HYSTERECTOMY     fibroids    Social History   Tobacco Use  . Smoking status: Never Smoker  . Smokeless tobacco: Never Used  Substance Use Topics  . Alcohol use: No    Alcohol/week: 0.0 standard drinks  . Drug use: No     Medication list has been reviewed and updated.  Current Meds  Medication Sig  . amLODipine (NORVASC) 2.5 MG tablet Take 1 tablet (2.5 mg total) by mouth daily.  [name] glimepiride (AMARYL) 2 MG tablet Take 1 tablet (2 mg total) by mouth daily before breakfast.  . glucose blood (ONE TOUCH ULTRA TEST) test strip E11.9  . ibuprofen (ADVIL,MOTRIN) 600 MG tablet TAKE 1 TABLET (600 MG TOTAL) BY MOUTH EVERY 8 (EIGHT) HOURS AS NEEDED.  [name] Lancets (ONETOUCH ULTRASOFT) lancets Use as instructed  . levocetirizine (XYZAL) 5 MG tablet TAKE 1 TABLET BY MOUTH EVERY DAY IN THE EVENING  . losartan (COZAAR) 50 MG tablet TAKE 1 TABLET BY MOUTH EVERY DAY  . omeprazole (PRILOSEC) 40 MG capsule Take 1 capsule (40 mg total) by mouth daily.  . simvastatin (ZOCOR) 10 MG tablet TAKE 1 TABLET BY MOUTH EVERYDAY AT BEDTIME  . [DISCONTINUED]  fluticasone (FLONASE) 50 MCG/ACT nasal spray Place 2 sprays into both nostrils daily.  . [DISCONTINUED] traMADol (ULTRAM) 50 MG tablet Take 1 tablet (50 mg total) by mouth every 8 (eight) hours as needed.    PHQ 2/9 Scores 11/26/2018 10/04/2018 08/11/2018 04/19/2018  PHQ - 2 Score 0 0 0 0  PHQ- 9 Score 0 - - 0    BP  Readings from Last 3 Encounters:  11/26/18 140/81  10/04/18 122/80  08/11/18 128/82    Physical Exam Vitals signs and nursing note reviewed.  Constitutional:      General: She is not in acute distress.    Appearance: She is well-developed.  HENT:     Head: Normocephalic and atraumatic.  Neck:     Musculoskeletal: Normal range of motion.     Vascular: No carotid bruit.  Cardiovascular:     Rate and Rhythm: Normal rate and regular rhythm.     Pulses: Normal pulses.     Heart sounds: No murmur.  Pulmonary:     Effort: Pulmonary effort is normal. No respiratory distress.     Breath sounds: No wheezing or rhonchi.  Abdominal:     General: Abdomen is flat. Bowel sounds are normal.     Palpations: Abdomen is soft.     Tenderness: There is no abdominal tenderness.  Musculoskeletal: Normal range of motion.     Right lower leg: No edema.     Left lower leg: No edema.  Lymphadenopathy:     Cervical: No cervical adenopathy.  Skin:    General: Skin is warm and dry.     Capillary Refill: Capillary refill takes less than 2 seconds.     Findings: No rash.  Neurological:     Mental Status: She is alert and oriented to person, place, and time.  Psychiatric:        Behavior: Behavior normal.        Thought Content: Thought content normal.     Wt Readings from Last 3 Encounters:  11/26/18 180 lb 3.2 oz (81.7 kg)  10/04/18 176 lb (79.8 kg)  08/11/18 176 lb (79.8 kg)    BP 140/81   Pulse 70   Resp 16   Ht 5\' 2"  (1.575 m)   Wt 180 lb 3.2 oz (81.7 kg)   SpO2 97%   BMI 32.96 kg/m   Assessment and Plan: 1. DM (diabetes mellitus), type 2 with complications (HCC) Controlled on oral agents - Hemoglobin A1c  2. Gastro-esophageal reflux disease without esophagitis Take PPI daily Elevate while sleeping on wedge pillow    Partially dictated using Editor, commissioning. Any errors are unintentional.  [name], MD [facility]  11/26/2018

## 2018-11-27 LAB — HEMOGLOBIN A1C
Est. average glucose Bld gHb Est-mCnc: 163 mg/dL
Hgb A1c MFr Bld: 7.3 % — ABNORMAL HIGH (ref 4.8–5.6)

## 2018-12-29 ENCOUNTER — Other Ambulatory Visit: Payer: Medicare HMO | Admitting: Orthotics

## 2019-01-19 ENCOUNTER — Other Ambulatory Visit: Payer: Self-pay

## 2019-01-19 ENCOUNTER — Other Ambulatory Visit: Payer: Medicare HMO | Admitting: Orthotics

## 2019-02-18 ENCOUNTER — Ambulatory Visit (INDEPENDENT_AMBULATORY_CARE_PROVIDER_SITE_OTHER): Payer: Medicare HMO

## 2019-02-18 ENCOUNTER — Other Ambulatory Visit: Payer: Self-pay

## 2019-02-18 DIAGNOSIS — Z23 Encounter for immunization: Secondary | ICD-10-CM

## 2019-02-27 ENCOUNTER — Other Ambulatory Visit: Payer: Self-pay | Admitting: Internal Medicine

## 2019-02-28 ENCOUNTER — Encounter: Payer: Self-pay | Admitting: Internal Medicine

## 2019-02-28 ENCOUNTER — Ambulatory Visit (INDEPENDENT_AMBULATORY_CARE_PROVIDER_SITE_OTHER): Payer: Medicare HMO | Admitting: Internal Medicine

## 2019-02-28 ENCOUNTER — Other Ambulatory Visit: Payer: Self-pay

## 2019-02-28 VITALS — BP 138/78 | HR 85 | Ht 62.0 in | Wt 183.0 lb

## 2019-02-28 DIAGNOSIS — Z1231 Encounter for screening mammogram for malignant neoplasm of breast: Secondary | ICD-10-CM

## 2019-02-28 DIAGNOSIS — E118 Type 2 diabetes mellitus with unspecified complications: Secondary | ICD-10-CM

## 2019-02-28 DIAGNOSIS — I10 Essential (primary) hypertension: Secondary | ICD-10-CM

## 2019-02-28 MED ORDER — HYDROCHLOROTHIAZIDE 25 MG PO TABS: 25.0000 mg | ORAL_TABLET | Freq: Every day | ORAL | 3 refills | Status: DC

## 2019-02-28 NOTE — Progress Notes (Signed)
Date:  02/28/2019   Name:  Kathy Dougherty   DOB:  05/01/1951   MRN:  [id number]   Chief Complaint: Diabetes and Hypertension  Diabetes She presents for her follow-up diabetic visit. She has type 2 diabetes mellitus. Her disease course has been stable. Pertinent negatives for hypoglycemia include no headaches or tremors. Pertinent negatives for diabetes include no chest pain, no fatigue, no polydipsia and no polyuria. Current diabetic treatment includes oral agent (monotherapy). She is compliant with treatment all of the time. An ACE inhibitor/angiotensin II receptor blocker is being taken. Eye exam is current.  Hypertension This is a chronic problem. The problem is controlled. Pertinent negatives include no chest pain, headaches, palpitations or shortness of breath. There are no associated agents to hypertension.   Lab Results  Component Value Date   HGBA1C 7.3 (H) 11/26/2018   Lab Results  Component Value Date   CREATININE 0.68 08/11/2018   BUN 8 08/11/2018   NA 140 08/11/2018   K 3.8 08/11/2018   CL 102 08/11/2018   CO2 23 08/11/2018     Review of Systems  Constitutional: Negative for appetite change, fatigue, fever and unexpected weight change.  HENT: Negative for tinnitus and trouble swallowing.   Eyes: Negative for visual disturbance.  Respiratory: Negative for cough, chest tightness and shortness of breath.   Cardiovascular: Negative for chest pain, palpitations and leg swelling.  Gastrointestinal: Negative for abdominal pain.  Endocrine: Negative for polydipsia and polyuria.  Genitourinary: Negative for dysuria and hematuria.  Musculoskeletal: Negative for arthralgias.  Neurological: Negative for tremors, numbness and headaches.  Psychiatric/Behavioral: Negative for dysphoric mood.    Patient Active Problem List   Diagnosis Date Noted  . Hyperlipidemia associated with type 2 diabetes mellitus ([place]) 03/26/2017  . 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 Pre-ulcerative  calluses 03/26/2017  . Arthritis of right knee 01/15/2016  . DM (diabetes mellitus), type 2 with complications ([place]) [id number]  . Abnormal LFTs 03/15/2015  . Essential (primary) hypertension 03/15/2015  . Gastro-esophageal reflux disease without esophagitis 03/15/2015  . Allergic rhinitis, seasonal 03/15/2015    Allergies  Allergen Reactions  . Ace Inhibitors Cough  . Metformin And Related Diarrhea  . Penicillins Rash  . Sulfa Antibiotics Rash    Past Surgical History:  Procedure Laterality Date  . CATARACT EXTRACTION Right 08/2014  . CATARACT EXTRACTION Left 04/2016  . HAMMER TOE SURGERY    . PARTIAL HYSTERECTOMY     fibroids    Social History   Tobacco Use  . Smoking status: Never Smoker  . Smokeless tobacco: Never Used  Substance Use Topics  . Alcohol use: No    Alcohol/week: 0.0 standard drinks  . Drug use: No     Medication list has been reviewed and updated.  Current Meds  Medication Sig  . amLODipine (NORVASC) 2.5 MG tablet Take 1 tablet (2.5 mg total) by mouth daily.  [name] glimepiride (AMARYL) 2 MG tablet Take 1 tablet (2 mg total) by mouth daily before breakfast.  . glucose blood (ONE TOUCH ULTRA TEST) test strip E11.9  . glucose blood test strip   . ibuprofen (ADVIL,MOTRIN) 600 MG tablet TAKE 1 TABLET (600 MG TOTAL) BY MOUTH EVERY 8 (EIGHT) HOURS AS NEEDED.  [name] Lancets (ONETOUCH ULTRASOFT) lancets Use as instructed  . levocetirizine (XYZAL) 5 MG tablet TAKE 1 TABLET BY MOUTH EVERY DAY IN THE EVENING  . losartan (COZAAR) 50 MG tablet TAKE 1 TABLET BY MOUTH EVERY DAY  . omeprazole (PRILOSEC) 40  MG capsule Take 1 capsule (40 mg total) by mouth daily.  . simvastatin (ZOCOR) 10 MG tablet TAKE 1 TABLET BY MOUTH EVERYDAY AT BEDTIME    PHQ 2/9 Scores 02/28/2019 11/26/2018 10/04/2018 08/11/2018  PHQ - 2 Score 0 0 0 0  PHQ- 9 Score 6 0 - -    BP Readings from Last 3 Encounters:  02/28/19 138/78  11/26/18 140/81  10/04/18 122/80    Physical Exam Vitals signs  and nursing note reviewed.  Constitutional:      General: She is not in acute distress.    Appearance: Normal appearance. She is well-developed.  HENT:     Head: Normocephalic and atraumatic.  Neck:     Vascular: No carotid bruit.  Cardiovascular:     Rate and Rhythm: Normal rate and regular rhythm.     Pulses: Normal pulses.     Heart sounds: No murmur.  Pulmonary:     Effort: Pulmonary effort is normal. No respiratory distress.  Musculoskeletal: Normal range of motion.     Right lower leg: Edema (trace edema) present.     Left lower leg: Edema (trace edema) present.  Lymphadenopathy:     Cervical: No cervical adenopathy.  Skin:    General: Skin is warm and dry.     Capillary Refill: Capillary refill takes less than 2 seconds.     Findings: No rash.  Neurological:     General: No focal deficit present.     Mental Status: She is alert and oriented to person, place, and time.  Psychiatric:        Behavior: Behavior normal.        Thought Content: Thought content normal.     Wt Readings from Last 3 Encounters:  02/28/19 183 lb (83 kg)  11/26/18 180 lb 3.2 oz (81.7 kg)  10/04/18 176 lb (79.8 kg)    BP 138/78   Pulse 85   Ht 5\' 2"  (1.575 m)   Wt 183 lb (83 kg)   SpO2 96%   BMI 33.47 kg/m   Assessment and Plan: 1. DM (diabetes mellitus), type 2 with complications ([place]) Clinically stable by exam and report without s/s of hypoglycemia. DM complicated by HTN.. Tolerating medications amaryl 2 mg daily well without side effects or other concerns. - Hemoglobin A1c  2. Essential (primary) hypertension Clinically stable exam with well controlled BP but with some dependent edema. Tolerating medications, amlodipine 2.5 mg and losartan 50 mg, without side effects at this time. Pt to continue low sodium diet; benefits of regular exercise as able discussed. Will add HCTZ 25 mg daily - hydrochlorothiazide (HYDRODIURIL) 25 MG tablet; Take 1 tablet (25 mg total) by mouth daily.   Dispense: 90 tablet; Refill: 3 - Basic metabolic panel - TSH  3. Encounter for screening mammogram for breast cancer Schedule at [facility] - MM 3D SCREEN BREAST BILATERAL; Future   Partially dictated using Editor, commissioning. Any errors are unintentional.  [name], MD [facility]  02/28/2019

## 2019-03-01 LAB — HEMOGLOBIN A1C
Est. average glucose Bld gHb Est-mCnc: 169 mg/dL
Hgb A1c MFr Bld: 7.5 % — ABNORMAL HIGH (ref 4.8–5.6)

## 2019-03-01 LAB — BASIC METABOLIC PANEL
BUN/Creatinine Ratio: 20 (ref 12–28)
BUN: 13 mg/dL (ref 8–27)
CO2: 25 mmol/L (ref 20–29)
Calcium: 9.9 mg/dL (ref 8.7–10.3)
Chloride: 104 mmol/L (ref 96–106)
Creatinine, Ser: 0.64 mg/dL (ref 0.57–1.00)
GFR calc Af Amer: 106 mL/min/{1.73_m2} (ref >59)
GFR calc non Af Amer: 92 mL/min/{1.73_m2} (ref >59)
Glucose: 106 mg/dL — ABNORMAL HIGH (ref 65–99)
Potassium: 4.1 mmol/L (ref 3.5–5.2)
Sodium: 142 mmol/L (ref 134–144)

## 2019-03-01 LAB — TSH: TSH: 1.78 u[IU]/mL (ref 0.450–4.500)

## 2019-04-18 ENCOUNTER — Encounter: Payer: Self-pay | Admitting: Internal Medicine

## 2019-04-18 ENCOUNTER — Ambulatory Visit (INDEPENDENT_AMBULATORY_CARE_PROVIDER_SITE_OTHER): Payer: Medicare HMO | Admitting: Internal Medicine

## 2019-04-18 ENCOUNTER — Other Ambulatory Visit: Payer: Self-pay

## 2019-04-18 VITALS — BP 122/70 | HR 82 | Ht 62.0 in | Wt 179.0 lb

## 2019-04-18 DIAGNOSIS — E118 Type 2 diabetes mellitus with unspecified complications: Secondary | ICD-10-CM

## 2019-04-18 DIAGNOSIS — Z1231 Encounter for screening mammogram for malignant neoplasm of breast: Secondary | ICD-10-CM

## 2019-04-18 DIAGNOSIS — I10 Essential (primary) hypertension: Secondary | ICD-10-CM | POA: Diagnosis not present

## 2019-04-18 DIAGNOSIS — E1169 Type 2 diabetes mellitus with other specified complication: Secondary | ICD-10-CM | POA: Diagnosis not present

## 2019-04-18 DIAGNOSIS — Z0000 Encounter for general adult medical examination without abnormal findings: Secondary | ICD-10-CM | POA: Diagnosis not present

## 2019-04-18 DIAGNOSIS — E785 Hyperlipidemia, unspecified: Secondary | ICD-10-CM | POA: Diagnosis not present

## 2019-04-18 LAB — POCT URINALYSIS DIPSTICK
Bilirubin, UA: NEGATIVE
Blood, UA: NEGATIVE
Glucose, UA: NEGATIVE
Ketones, UA: NEGATIVE
Leukocytes, UA: NEGATIVE
Nitrite, UA: NEGATIVE
Protein, UA: NEGATIVE
Spec Grav, UA: 1.01
Urobilinogen, UA: 0.2 U/dL
pH, UA: 6.5

## 2019-04-18 NOTE — Progress Notes (Signed)
Date:  04/18/2019   Name:  Kathy Dougherty   DOB:  1951/02/13   MRN:  [id number]   Chief Complaint: Annual Exam (Breast Exam. Labs.) Kathy Dougherty is a 68 y.o. female who presents today for her Complete Annual Exam. She feels well. She reports exercising at work. She reports she is sleeping fairly well. She denies breast issues.  Mammogram  04/2018 - scheduled 05/06/2019 DEXA  08/2017 Colonoscopy 2014 - repeat 10 yrs Immunization History  Administered Date(s) Administered  . Fluad Quad(high Dose 65+) 02/18/2019  . Influenza, High Dose Seasonal PF 03/23/2017, 03/22/2018  . Influenza,inj,Quad PF,6+ Mos 03/19/2015, 03/20/2016  . Pneumococcal Conjugate-13 03/20/2016  . Pneumococcal Polysaccharide-23 03/23/2017  . Zoster 11/05/2012    Hypertension This is a chronic problem. The problem is controlled (at home 120/70). Pertinent negatives include no chest pain, headaches, palpitations or shortness of breath. Past treatments include calcium channel blockers, angiotensin blockers and diuretics. The current treatment provides significant improvement.  Diabetes She presents for her follow-up diabetic visit. She has type 2 diabetes mellitus. Pertinent negatives for hypoglycemia include no dizziness, headaches, nervousness/anxiousness or tremors. Pertinent negatives for diabetes include no chest pain, no fatigue, no polydipsia and no polyuria. Current diabetic treatment includes oral agent (monotherapy). An ACE inhibitor/angiotensin II receptor blocker is being taken.  Hyperlipidemia The problem is controlled. Pertinent negatives include no chest pain or shortness of breath. Current antihyperlipidemic treatment includes statins. The current treatment provides significant improvement of lipids.    Lab Results  Component Value Date   CREATININE 0.64 02/28/2019   BUN 13 02/28/2019   NA 142 02/28/2019   K 4.1 02/28/2019   CL 104 02/28/2019   CO2 25 02/28/2019   Lab Results  Component Value  Date   CHOL 106 04/09/2018   HDL 41 04/09/2018   LDLCALC 58 04/09/2018   TRIG 37 04/09/2018   CHOLHDL 2.6 04/09/2018   Lab Results  Component Value Date   TSH 1.780 02/28/2019   Lab Results  Component Value Date   HGBA1C 7.5 (H) 02/28/2019     Review of Systems  Constitutional: Negative for chills, fatigue and fever.  HENT: Negative for congestion, hearing loss, tinnitus, trouble swallowing and voice change.   Eyes: Negative for visual disturbance.  Respiratory: Negative for cough, chest tightness, shortness of breath and wheezing.   Cardiovascular: Negative for chest pain, palpitations and leg swelling.  Gastrointestinal: Negative for abdominal pain, constipation, diarrhea and vomiting.  Endocrine: Negative for polydipsia and polyuria.  Genitourinary: Negative for dysuria, frequency, genital sores, vaginal bleeding and vaginal discharge.  Musculoskeletal: Negative for arthralgias, gait problem and joint swelling.  Skin: Negative for color change and rash.  Neurological: Negative for dizziness, tremors, light-headedness and headaches.  Hematological: Negative for adenopathy. Does not bruise/bleed easily.  Psychiatric/Behavioral: Negative for dysphoric mood and sleep disturbance. The patient is not nervous/anxious.     Patient Active Problem List   Diagnosis Date Noted  . Hyperlipidemia associated with type 2 diabetes mellitus ([place]) 03/26/2017  . 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 Pre-ulcerative calluses 03/26/2017  . Arthritis of right knee 01/15/2016  . DM (diabetes mellitus), type 2 with complications ([place]) [id number]  . Abnormal LFTs 03/15/2015  . Essential (primary) hypertension 03/15/2015  . Gastro-esophageal reflux disease without esophagitis 03/15/2015  . Allergic rhinitis, seasonal 03/15/2015    Allergies  Allergen Reactions  . Ace Inhibitors Cough  . Metformin And Related Diarrhea  . Penicillins Rash  . Sulfa Antibiotics Rash  Past Surgical History:   Procedure Laterality Date  . CATARACT EXTRACTION Right 08/2014  . CATARACT EXTRACTION Left 04/2016  . HAMMER TOE SURGERY    . PARTIAL HYSTERECTOMY     fibroids    Social History   Tobacco Use  . Smoking status: Never Smoker  . Smokeless tobacco: Never Used  Substance Use Topics  . Alcohol use: No    Alcohol/week: 0.0 standard drinks  . Drug use: No     Medication list has been reviewed and updated.  Current Meds  Medication Sig  . amLODipine (NORVASC) 2.5 MG tablet Take 1 tablet (2.5 mg total) by mouth daily.  [name] glimepiride (AMARYL) 2 MG tablet Take 1 tablet (2 mg total) by mouth daily before breakfast.  . glucose blood (ONE TOUCH ULTRA TEST) test strip E11.9  . glucose blood test strip   . hydrochlorothiazide (HYDRODIURIL) 25 MG tablet Take 1 tablet (25 mg total) by mouth daily.  [name] ibuprofen (ADVIL,MOTRIN) 600 MG tablet TAKE 1 TABLET (600 MG TOTAL) BY MOUTH EVERY 8 (EIGHT) HOURS AS NEEDED.  [name] Lancets (ONETOUCH ULTRASOFT) lancets Use as instructed  . levocetirizine (XYZAL) 5 MG tablet TAKE 1 TABLET BY MOUTH EVERY DAY IN THE EVENING  . losartan (COZAAR) 50 MG tablet TAKE 1 TABLET BY MOUTH EVERY DAY  . omeprazole (PRILOSEC) 40 MG capsule Take 1 capsule (40 mg total) by mouth daily.  . simvastatin (ZOCOR) 10 MG tablet TAKE 1 TABLET BY MOUTH EVERYDAY AT BEDTIME    PHQ 2/9 Scores 04/18/2019 02/28/2019 11/26/2018 10/04/2018  PHQ - 2 Score 0 0 0 0  PHQ- 9 Score 3 6 0 -    BP Readings from Last 3 Encounters:  04/18/19 122/70  02/28/19 138/78  11/26/18 140/81    Physical Exam Vitals signs and nursing note reviewed.  Constitutional:      General: She is not in acute distress.    Appearance: She is well-developed.  HENT:     Head: Normocephalic and atraumatic.     Right Ear: Tympanic membrane and ear canal normal.     Left Ear: Tympanic membrane and ear canal normal.     Nose:     Right Sinus: No maxillary sinus tenderness.     Left Sinus: No maxillary sinus tenderness.   Eyes:     General: No scleral icterus.       Right eye: No discharge.        Left eye: No discharge.     Conjunctiva/sclera: Conjunctivae normal.  Neck:     Musculoskeletal: Normal range of motion. No erythema.     Thyroid: No thyromegaly.     Vascular: No carotid bruit.  Cardiovascular:     Rate and Rhythm: Normal rate and regular rhythm.     Pulses: Normal pulses.     Heart sounds: Normal heart sounds.  Pulmonary:     Effort: Pulmonary effort is normal. No respiratory distress.     Breath sounds: No wheezing.  Chest:     Breasts:        Right: No mass, nipple discharge, skin change or tenderness.        Left: No mass, nipple discharge, skin change or tenderness.  Abdominal:     General: Bowel sounds are normal.     Palpations: Abdomen is soft.     Tenderness: There is no abdominal tenderness.  Musculoskeletal: Normal range of motion.  Lymphadenopathy:     Cervical: No cervical adenopathy.  Skin:    General:  Skin is warm and dry.     Capillary Refill: Capillary refill takes less than 2 seconds.     Findings: No rash.     Comments: Very thick skin/callus on sole of right foot   Neurological:     Mental Status: She is alert and oriented to person, place, and time.     Cranial Nerves: No cranial nerve deficit.     Sensory: No sensory deficit.     Deep Tendon Reflexes: Reflexes are normal and symmetric.  Psychiatric:        Speech: Speech normal.        Behavior: Behavior normal.        Thought Content: Thought content normal.     Wt Readings from Last 3 Encounters:  04/18/19 179 lb (81.2 kg)  02/28/19 183 lb (83 kg)  11/26/18 180 lb 3.2 oz (81.7 kg)    BP 122/70   Pulse 82   Ht 5\' 2"  (1.575 m)   Wt 179 lb (81.2 kg)   SpO2 96%   BMI 32.74 kg/m   Assessment and Plan: 1. Annual physical exam Normal exam except for weight Continue work on diet and exercise - POCT urinalysis dipstick  2. Encounter for screening mammogram for breast cancer Scheduled at [facility]   3. Essential (primary) hypertension Clinically stable exam with well controlled BP.   Tolerating medications, amlodipine, losartan and hctz, without side effects at this time. Pt to continue current regimen and low sodium diet; benefits of regular exercise as able discussed. - CBC with Differential/Platelet - Comprehensive metabolic panel - TSH  4. Hyperlipidemia associated with type 2 diabetes mellitus ([place]) Tolerating statin medication without side effects at this time LDL is at goal of < 70 on current dose Continue same therapy without change at this time. - Lipid panel  5. DM (diabetes mellitus), type 2 with complications ([place]) Clinically stable by exam and report without s/s of hypoglycemia. Pt to contact podiatry about DM shoes DM complicated by htn, lipids. Tolerating medications amaryl well without side effects or other concerns. - Hemoglobin A1c   Partially dictated using Editor, commissioning. Any errors are unintentional.  Kathy Maidens, MD [facility]  04/18/2019

## 2019-04-19 LAB — COMPREHENSIVE METABOLIC PANEL
ALT: 16 IU/L (ref 0–32)
AST: 23 IU/L (ref 0–40)
Albumin/Globulin Ratio: 1.4 (ref 1.2–2.2)
Albumin: 4.3 g/dL (ref 3.8–4.8)
Alkaline Phosphatase: 85 IU/L (ref 39–117)
BUN/Creatinine Ratio: 18 (ref 12–28)
BUN: 14 mg/dL (ref 8–27)
Bilirubin Total: 0.4 mg/dL (ref 0.0–1.2)
CO2: 25 mmol/L (ref 20–29)
Calcium: 9.1 mg/dL (ref 8.7–10.3)
Chloride: 100 mmol/L (ref 96–106)
Creatinine, Ser: 0.8 mg/dL (ref 0.57–1.00)
GFR calc Af Amer: 88 mL/min/{1.73_m2} (ref >59)
GFR calc non Af Amer: 76 mL/min/{1.73_m2} (ref >59)
Globulin, Total: 3 g/dL (ref 1.5–4.5)
Glucose: 167 mg/dL — ABNORMAL HIGH (ref 65–99)
Potassium: 3.8 mmol/L (ref 3.5–5.2)
Sodium: 139 mmol/L (ref 134–144)
Total Protein: 7.3 g/dL (ref 6.0–8.5)

## 2019-04-19 LAB — CBC WITH DIFFERENTIAL/PLATELET
Basophils Absolute: 0 10*3/uL (ref 0.0–0.2)
Basos: 1 %
EOS (ABSOLUTE): 0.1 10*3/uL (ref 0.0–0.4)
Eos: 2 %
Hematocrit: 38 % (ref 34.0–46.6)
Hemoglobin: 12.4 g/dL (ref 11.1–15.9)
Immature Grans (Abs): 0 10*3/uL (ref 0.0–0.1)
Immature Granulocytes: 0 %
Lymphocytes Absolute: 1.5 10*3/uL (ref 0.7–3.1)
Lymphs: 29 %
MCH: 28.2 pg (ref 26.6–33.0)
MCHC: 32.6 g/dL (ref 31.5–35.7)
MCV: 86 fL (ref 79–97)
Monocytes Absolute: 0.6 10*3/uL (ref 0.1–0.9)
Monocytes: 11 %
Neutrophils Absolute: 2.9 10*3/uL (ref 1.4–7.0)
Neutrophils: 57 %
Platelets: 208 10*3/uL (ref 150–450)
RBC: 4.4 x10E6/uL (ref 3.77–5.28)
RDW: 13.1 % (ref 11.7–15.4)
WBC: 5.1 10*3/uL (ref 3.4–10.8)

## 2019-04-19 LAB — LIPID PANEL
Chol/HDL Ratio: 2.8 ratio (ref 0.0–4.4)
Cholesterol, Total: 105 mg/dL (ref 100–199)
HDL: 37 mg/dL — ABNORMAL LOW (ref >39)
LDL Chol Calc (NIH): 55 mg/dL (ref 0–99)
Triglycerides: 60 mg/dL (ref 0–149)
VLDL Cholesterol Cal: 13 mg/dL (ref 5–40)

## 2019-04-19 LAB — TSH: TSH: 1.68 u[IU]/mL (ref 0.450–4.500)

## 2019-04-19 LAB — HEMOGLOBIN A1C
Est. average glucose Bld gHb Est-mCnc: 180 mg/dL
Hgb A1c MFr Bld: 7.9 % — ABNORMAL HIGH (ref 4.8–5.6)

## 2019-04-25 ENCOUNTER — Ambulatory Visit (INDEPENDENT_AMBULATORY_CARE_PROVIDER_SITE_OTHER): Payer: Medicare HMO

## 2019-04-25 VITALS — Temp 98.3°F | Ht 62.0 in | Wt 179.0 lb

## 2019-04-25 DIAGNOSIS — E785 Hyperlipidemia, unspecified: Secondary | ICD-10-CM | POA: Diagnosis not present

## 2019-04-25 DIAGNOSIS — I10 Essential (primary) hypertension: Secondary | ICD-10-CM | POA: Diagnosis not present

## 2019-04-25 DIAGNOSIS — E1169 Type 2 diabetes mellitus with other specified complication: Secondary | ICD-10-CM

## 2019-04-25 DIAGNOSIS — Z594 Lack of adequate food and safe drinking water: Secondary | ICD-10-CM | POA: Diagnosis not present

## 2019-04-25 DIAGNOSIS — Z0000 Encounter for general adult medical examination without abnormal findings: Secondary | ICD-10-CM | POA: Diagnosis not present

## 2019-04-25 DIAGNOSIS — E118 Type 2 diabetes mellitus with unspecified complications: Secondary | ICD-10-CM | POA: Diagnosis not present

## 2019-04-25 DIAGNOSIS — Z5941 Food insecurity: Secondary | ICD-10-CM

## 2019-04-25 DIAGNOSIS — Z598 Other problems related to housing and economic circumstances: Secondary | ICD-10-CM | POA: Diagnosis not present

## 2019-04-25 DIAGNOSIS — Z599 Problem related to housing and economic circumstances, unspecified: Secondary | ICD-10-CM

## 2019-04-25 NOTE — Progress Notes (Signed)
Subjective:   Corley Luke is a 68 y.o. female who presents for Medicare Annual (Subsequent) preventive examination.  Virtual Visit via Telephone Note  I connected with [name] on 04/25/19 at  8:40 AM EST by telephone and verified that I am speaking with the correct person using two identifiers.  Medicare Annual Wellness visit completed telephonically due to Covid-19 pandemic.   Location: Patient: home Provider: office   I discussed the limitations, risks, security and privacy concerns of performing an evaluation and management service by telephone and the availability of in person appointments. The patient expressed understanding and agreed to proceed.  Some vital signs may be absent or patient reported.   Clemetine Marker, LPN    Review of Systems:   Cardiac Risk Factors include: advanced age (>20men, >22 women);diabetes mellitus;hypertension;dyslipidemia;obesity (BMI >30kg/m2)     Objective:     Vitals: Temp 98.3 F (36.8 C)   Ht 5\' 2"  (1.575 m)   Wt 179 lb (81.2 kg)   BMI 32.74 kg/m   Body mass index is 32.74 kg/m.  Advanced Directives 04/25/2019 04/19/2018 03/23/2017 06/19/2015  Does Patient Have a Medical Advance Directive? No No No No  Would patient like information on creating a medical advance directive? Yes (MAU/Ambulatory/Procedural Areas - Information given) No - Patient declined Yes (MAU/Ambulatory/Procedural Areas - Information given) -    Tobacco Social History   Tobacco Use  Smoking Status Never Smoker  Smokeless Tobacco Never Used     Counseling given: Not Answered   Clinical Intake:  Pre-visit preparation completed: Yes  Pain : No/denies pain     BMI - recorded: 32.74 Nutritional Status: BMI > 30  Obese Nutritional Risks: None Diabetes: Yes CBG done?: No Did pt. bring in CBG monitor from home?: No   Nutrition Risk Assessment:  Has the patient had any N/V/D within the last 2 months?  No  Does the patient have any non-healing  wounds?  No  Has the patient had any unintentional weight loss or weight gain?  No   Diabetes:  Is the patient diabetic?  Yes  If diabetic, was a CBG obtained today?  No  Did the patient bring in their glucometer from home?  No  How often do you monitor your CBG's? Not often per patient.   Financial Strains and Diabetes Management:  Are you having any financial strains with the device, your supplies or your medication? Yes .  Does the patient want to be seen by Chronic Care Management for management of their diabetes?  Yes  Would the patient like to be referred to a Nutritionist or for Diabetic Management?  No   Diabetic Exams:  Diabetic Eye Exam: Completed 11/17/18.   Diabetic Foot Exam: Completed 11/17/18.   How often do you need to have someone help you when you read instructions, pamphlets, or other written materials from your doctor or pharmacy?: 1 - Never  Interpreter Needed?: No  Information entered by :: Clemetine Marker LPN  Past Medical History:  Diagnosis Date  . Diabetes mellitus without complication ([place])   . GERD (gastroesophageal reflux disease)   . Hyperlipidemia   . Hypertension   . Seasonal allergies    Past Surgical History:  Procedure Laterality Date  . CATARACT EXTRACTION Right 08/2014  . CATARACT EXTRACTION Left 04/2016  . HAMMER TOE SURGERY    . PARTIAL HYSTERECTOMY     fibroids   Family History  Problem Relation Age of Onset  . Diabetes Mother   . Cancer  Brother   . Diabetes Sister   . Cancer Brother   . Heart disease Brother   . Sudden death Brother   . Heart disease Brother    Social History   Socioeconomic History  . Marital status: Divorced    Spouse name: Not on file  . Number of children: 0  . Years of education: Not on file  . Highest education level: Some college, no degree  Occupational History  . Occupation: retired  Scientific laboratory technician  . Financial resource strain: Somewhat hard  . Food insecurity    Worry: Often true     Inability: Sometimes true  . Transportation needs    Medical: No    Non-medical: No  Tobacco Use  . Smoking status: Never Smoker  . Smokeless tobacco: Never Used  Substance and Sexual Activity  . Alcohol use: No    Alcohol/week: 0.0 standard drinks  . Drug use: No  . Sexual activity: Not Currently  Lifestyle  . Physical activity    Days per week: 0 days    Minutes per session: 0 min  . Stress: Patient refused  Relationships  . Social connections    Talks on phone: More than three times a week    Gets together: More than three times a week    Attends religious service: Never    Active member of club or organization: No    Attends meetings of clubs or organizations: Never    Relationship status: Divorced  Other Topics Concern  . Not on file  Social History Narrative  . Not on file    Outpatient Encounter Medications as of 04/25/2019  Medication Sig  . amLODipine (NORVASC) 2.5 MG tablet Take 1 tablet (2.5 mg total) by mouth daily.  [name] glimepiride (AMARYL) 2 MG tablet Take 1 tablet (2 mg total) by mouth daily before breakfast.  . glucose blood (ONE TOUCH ULTRA TEST) test strip E11.9  . hydrochlorothiazide (HYDRODIURIL) 25 MG tablet Take 1 tablet (25 mg total) by mouth daily.  [name] ibuprofen (ADVIL,MOTRIN) 600 MG tablet TAKE 1 TABLET (600 MG TOTAL) BY MOUTH EVERY 8 (EIGHT) HOURS AS NEEDED.  [name] Lancets (ONETOUCH ULTRASOFT) lancets Use as instructed  . levocetirizine (XYZAL) 5 MG tablet TAKE 1 TABLET BY MOUTH EVERY DAY IN THE EVENING  . losartan (COZAAR) 50 MG tablet TAKE 1 TABLET BY MOUTH EVERY DAY  . omeprazole (PRILOSEC) 40 MG capsule Take 1 capsule (40 mg total) by mouth daily.  . simvastatin (ZOCOR) 10 MG tablet TAKE 1 TABLET BY MOUTH EVERYDAY AT BEDTIME  . [DISCONTINUED] glucose blood test strip    No facility-administered encounter medications on file as of 04/25/2019.     Activities of Daily Living In your present state of health, do you have any difficulty performing the  following activities: 04/25/2019  Hearing? Y  Comment declines hearing aids  Vision? N  Difficulty concentrating or making decisions? N  Walking or climbing stairs? N  Dressing or bathing? N  Doing errands, shopping? N  Preparing Food and eating ? N  Using the Toilet? N  In the past six months, have you accidently leaked urine? N  Do you have problems with loss of bowel control? N  Managing your Medications? N  Managing your Finances? N  Housekeeping or managing your Housekeeping? N  Some recent data might be hidden    Patient Care Team: [name], MD as PCP - General (Internal Medicine) Griffin Dakin (Optometry)    Assessment:   This  is a routine wellness examination for Aveline.  Exercise Activities and Dietary recommendations Current Exercise Habits: The patient does not participate in regular exercise at present, Exercise limited by: None identified  Goals    . Increase physical activity     Join [employer] and increase physical activity to 150 minutes per week.     . Prevent Falls     Recommended to remove items from the home that may cause slips or trips.       Fall Risk Fall Risk  04/25/2019 11/26/2018 10/04/2018 08/11/2018 04/19/2018  Falls in the past year? 0 0 0 0 0  Number falls in past yr: 0 0 0 0 0  Injury with Fall? 0 0 0 0 -  Risk for fall due to : - - History of fall(s) - -  Follow up Falls prevention discussed - Falls evaluation completed Falls evaluation completed -   FALL RISK PREVENTION PERTAINING TO THE HOME:  Any stairs in or around the home? No  If so, do they handrails? No   Home free of loose throw rugs in walkways, pet beds, electrical cords, etc? Yes  Adequate lighting in your home to reduce risk of falls? Yes   ASSISTIVE DEVICES UTILIZED TO PREVENT FALLS:  Life alert? No  Use of a cane, walker or w/c? No  Grab bars in the bathroom? Yes  Shower chair or bench in shower? No  Elevated toilet seat or a handicapped toilet? No    DME ORDERS:  DME order needed?  No   TIMED UP AND GO:  Was the test performed? No . Telephonic visit.   Education: Fall risk prevention has been discussed.  Intervention(s) required? No    Depression Screen PHQ 2/9 Scores 04/25/2019 04/18/2019 02/28/2019 11/26/2018  PHQ - 2 Score 2 0 0 0  PHQ- 9 Score 5 3 6  0     Cognitive Function     6CIT Screen 04/25/2019 03/23/2017  What Year? 0 points 0 points  What month? 0 points 0 points  What time? 0 points 0 points  Count back from 20 0 points 0 points  Months in reverse 0 points 0 points  Repeat phrase 0 points 2 points  Total Score 0 2    Immunization History  Administered Date(s) Administered  . Fluad Quad(high Dose 65+) 02/18/2019  . Influenza, High Dose Seasonal PF 03/23/2017, 03/22/2018  . Influenza,inj,Quad PF,6+ Mos 03/19/2015, 03/20/2016  . Pneumococcal Conjugate-13 03/20/2016  . Pneumococcal Polysaccharide-23 03/23/2017  . Zoster 11/05/2012    Qualifies for Shingles Vaccine? Yes  Zostavax completed 2014. Due for Shingrix. Education has been provided regarding the importance of this vaccine. Pt has been advised to call insurance company to determine out of pocket expense. Advised may also receive vaccine at local pharmacy or Health Dept. Verbalized acceptance and understanding.  Tdap: Although this vaccine is not a covered service during a Wellness Exam, does the patient still wish to receive this vaccine today?  No .  Education has been provided regarding the importance of this vaccine. Advised may receive this vaccine at local pharmacy or Health Dept. Aware to provide a copy of the vaccination record if obtained from local pharmacy or Health Dept. Verbalized acceptance and understanding.  Flu Vaccine: Up to date  Pneumococcal Vaccine: Up to date   Screening Tests Health Maintenance  Topic Date Due  . MAMMOGRAM  04/22/2019  . TETANUS/TDAP  11/26/2019 (Originally [birth year]1970)  . HEMOGLOBIN A1C  10/16/2019  . FOOT  EXAM  11/17/2019  . OPHTHALMOLOGY EXAM  11/17/2019  . COLONOSCOPY  05/20/2022  . INFLUENZA VACCINE  Completed  . DEXA SCAN  Completed  . PNA vac Low Risk Adult  Completed  . Hepatitis C Screening  Addressed    Cancer Screenings:  Colorectal Screening: Completed 12/14/12. Repeat every 10 years;  Mammogram: Completed. No longer required. Ordered 02/28/19. Pt provided with contact information and advised to call to schedule appt.   Bone Density: Completed 08/18/17. Results reflect  OSTEOPENIA. Repeat every 2 years.   Lung Cancer Screening: (Low Dose CT Chest recommended if Age 63-80 years, 30 pack-year currently smoking OR have quit w/in 15years.) does not qualify.    Additional Screening:  Hepatitis C Screening: does qualify; Completed 12/01/12  Vision Screening: Recommended annual ophthalmology exams for early detection of glaucoma and other disorders of the eye. Is the patient up to date with their annual eye exam?  Yes  Who is the provider or what is the name of the office in which the pt attends annual eye exams? Dr. Lucia Gaskins  Dental Screening: Recommended annual dental exams for proper oral hygiene  Community Resource Referral:  CRR required this visit?  Yes      Plan:     I have personally reviewed and addressed the Medicare Annual Wellness questionnaire and have noted the following in the patient's chart:  A. Medical and social history B. Use of alcohol, tobacco or illicit drugs  C. Current medications and supplements D. Functional ability and status E.  Nutritional status F.  Physical activity G. Advance directives H. List of other physicians I.  Hospitalizations, surgeries, and ER visits in previous 12 months J.  [place] such as hearing and vision if needed, cognitive and depression L. Referrals and appointments   In addition, I have reviewed and discussed with patient certain preventive protocols, quality metrics, and best practice recommendations. A  written personalized care plan for preventive services as well as general preventive health recommendations were provided to patient.   Signed,  Clemetine Marker, LPN Nurse Health Advisor   Nurse Notes: C3 referral sent for food insecurities and CCM referral sent for diabetes management.

## 2019-04-25 NOTE — Patient Instructions (Signed)
Kathy Dougherty , Thank you for taking time to come for your Medicare Wellness Visit. I appreciate your ongoing commitment to your health goals. Please review the following plan we discussed and let me know if I can assist you in the future.   Screening recommendations/referrals: Colonoscopy: done 12/14/12 Mammogram: done 04/21/18 Bone Density: done 08/18/17 Recommended yearly ophthalmology/optometry visit for glaucoma screening and checkup Recommended yearly dental visit for hygiene and checkup  Vaccinations: Influenza vaccine: done 02/18/19 Pneumococcal vaccine: done 03/23/17 Tdap vaccine: due Shingles vaccine: Shingrix discussed. Please contact your pharmacy for coverage information.   Advanced directives: Advance directive discussed with you today. I have provided a copy for you to complete at home and have notarized. Once this is complete please bring a copy in to our office so we can scan it into your chart.  Conditions/risks identified: Recommend increasing physical activity  Next appointment: Please follow up in one year for your Medicare Annual Wellness visit.     Preventive Care 7 Years and Older, Female Preventive care refers to lifestyle choices and visits with your health care provider that can promote health and wellness. What does preventive care include?  A yearly physical exam. This is also called an annual well check.  Dental exams once or twice a year.  Routine eye exams. Ask your health care provider how often you should have your eyes checked.  Personal lifestyle choices, including:  Daily care of your teeth and gums.  Regular physical activity.  Eating a healthy diet.  Avoiding tobacco and drug use.  Limiting alcohol use.  Practicing safe sex.  Taking low-dose aspirin every day.  Taking vitamin and mineral supplements as recommended by your health care provider. What happens during an annual well check? The services and screenings done by your health  care provider during your annual well check will depend on your age, overall health, lifestyle risk factors, and family history of disease. Counseling  Your health care provider may ask you questions about your:  Alcohol use.  Tobacco use.  Drug use.  Emotional well-being.  Home and relationship well-being.  Sexual activity.  Eating habits.  History of falls.  Memory and ability to understand (cognition).  Work and work Statistician.  Reproductive health. Screening  You may have the following tests or measurements:  Height, weight, and BMI.  Blood pressure.  Lipid and cholesterol levels. These may be checked every 5 years, or more frequently if you are over 16 years old.  Skin check.  Lung cancer screening. You may have this screening every year starting at age 55 if you have a 30-pack-year history of smoking and currently smoke or have quit within the past 15 years.  Fecal occult blood test (FOBT) of the stool. You may have this test every year starting at age 50.  Flexible sigmoidoscopy or colonoscopy. You may have a sigmoidoscopy every 5 years or a colonoscopy every 10 years starting at age 27.  Hepatitis C blood test.  Hepatitis B blood test.  Sexually transmitted disease (STD) testing.  Diabetes screening. This is done by checking your blood sugar (glucose) after you have not eaten for a while (fasting). You may have this done every 1-3 years.  Bone density scan. This is done to screen for osteoporosis. You may have this done starting at age 52.  Mammogram. This may be done every 1-2 years. Talk to your health care provider about how often you should have regular mammograms. Talk with your health care provider about your  test results, treatment options, and if necessary, the need for more tests. Vaccines  Your health care provider may recommend certain vaccines, such as:  Influenza vaccine. This is recommended every year.  Tetanus, diphtheria, and  acellular pertussis (Tdap, Td) vaccine. You may need a Td booster every 10 years.  Zoster vaccine. You may need this after age 79.  Pneumococcal 13-valent conjugate (PCV13) vaccine. One dose is recommended after age 57.  Pneumococcal polysaccharide (PPSV23) vaccine. One dose is recommended after age 52. Talk to your health care provider about which screenings and vaccines you need and how often you need them. This information is not intended to replace advice given to you by your health care provider. Make sure you discuss any questions you have with your health care provider. Document Released: 06/01/2015 Document Revised: 01/23/2016 Document Reviewed: 03/06/2015 Elsevier Interactive Patient Education  2017 [place] Prevention in the Home Falls can cause injuries. They can happen to people of all ages. There are many things you can do to make your home safe and to help prevent falls. What can I do on the outside of my home?  Regularly fix the edges of walkways and driveways and fix any cracks.  Remove anything that might make you trip as you walk through a door, such as a raised step or threshold.  Trim any bushes or trees on the path to your home.  Use bright outdoor lighting.  Clear any walking paths of anything that might make someone trip, such as rocks or tools.  Regularly check to see if handrails are loose or broken. Make sure that both sides of any steps have handrails.  Any raised decks and porches should have guardrails on the edges.  Have any leaves, snow, or ice cleared regularly.  Use sand or salt on walking paths during winter.  Clean up any spills in your garage right away. This includes oil or grease spills. What can I do in the bathroom?  Use night lights.  Install grab bars by the toilet and in the tub and shower. Do not use towel bars as grab bars.  Use non-skid mats or decals in the tub or shower.  If you need to sit down in the shower, use  a plastic, non-slip stool.  Keep the floor dry. Clean up any water that spills on the floor as soon as it happens.  Remove soap buildup in the tub or shower regularly.  Attach bath mats securely with double-sided non-slip rug tape.  Do not have throw rugs and other things on the floor that can make you trip. What can I do in the bedroom?  Use night lights.  Make sure that you have a light by your bed that is easy to reach.  Do not use any sheets or blankets that are too big for your bed. They should not hang down onto the floor.  Have a firm chair that has side arms. You can use this for support while you get dressed.  Do not have throw rugs and other things on the floor that can make you trip. What can I do in the kitchen?  Clean up any spills right away.  Avoid walking on wet floors.  Keep items that you use a lot in easy-to-reach places.  If you need to reach something above you, use a strong step stool that has a grab bar.  Keep electrical cords out of the way.  Do not use floor polish or wax that  makes floors slippery. If you must use wax, use non-skid floor wax.  Do not have throw rugs and other things on the floor that can make you trip. What can I do with my stairs?  Do not leave any items on the stairs.  Make sure that there are handrails on both sides of the stairs and use them. Fix handrails that are broken or loose. Make sure that handrails are as long as the stairways.  Check any carpeting to make sure that it is firmly attached to the stairs. Fix any carpet that is loose or worn.  Avoid having throw rugs at the top or bottom of the stairs. If you do have throw rugs, attach them to the floor with carpet tape.  Make sure that you have a light switch at the top of the stairs and the bottom of the stairs. If you do not have them, ask someone to add them for you. What else can I do to help prevent falls?  Wear shoes that:  Do not have high heels.  Have  rubber bottoms.  Are comfortable and fit you well.  Are closed at the toe. Do not wear sandals.  If you use a stepladder:  Make sure that it is fully opened. Do not climb a closed stepladder.  Make sure that both sides of the stepladder are locked into place.  Ask someone to hold it for you, if possible.  Clearly mark and make sure that you can see:  Any grab bars or handrails.  First and last steps.  Where the edge of each step is.  Use tools that help you move around (mobility aids) if they are needed. These include:  Canes.  Walkers.  Scooters.  Crutches.  Turn on the lights when you go into a dark area. Replace any light bulbs as soon as they burn out.  Set up your furniture so you have a clear path. Avoid moving your furniture around.  If any of your floors are uneven, fix them.  If there are any pets around you, be aware of where they are.  Review your medicines with your doctor. Some medicines can make you feel dizzy. This can increase your chance of falling. Ask your doctor what other things that you can do to help prevent falls. This information is not intended to replace advice given to you by your health care provider. Make sure you discuss any questions you have with your health care provider. Document Released: 03/01/2009 Document Revised: 10/11/2015 Document Reviewed: 06/09/2014 Elsevier Interactive Patient Education  2017 [employer].

## 2019-04-26 ENCOUNTER — Other Ambulatory Visit: Payer: Self-pay

## 2019-04-26 DIAGNOSIS — Z20822 Contact with and (suspected) exposure to covid-19: Secondary | ICD-10-CM

## 2019-04-28 ENCOUNTER — Telehealth: Payer: Self-pay | Admitting: General Practice

## 2019-04-28 LAB — NOVEL CORONAVIRUS, NAA: SARS-CoV-2, NAA: NOT DETECTED

## 2019-04-28 NOTE — Telephone Encounter (Signed)
Negative COVID results given. Patient results "NOT Detected." Caller expressed understanding. ° °

## 2019-04-29 ENCOUNTER — Telehealth: Payer: Self-pay

## 2019-04-29 NOTE — Telephone Encounter (Signed)
04/29/2019 Patient was unable to talk and requested that I email food pantry resources to her at [email or web address]. Ambrose Mantle [phone]

## 2019-05-04 ENCOUNTER — Ambulatory Visit: Payer: Medicare HMO | Admitting: Orthotics

## 2019-05-05 ENCOUNTER — Telehealth: Payer: Self-pay

## 2019-05-05 NOTE — Telephone Encounter (Signed)
Copied from [place] [id number]. Topic: Referral - Status >> May 05, 2019  0000000 PM Simone Curia D wrote: [id number] Spoke with patient about local food bank resources and applying for food stamps. Per request from patient emailed additional resources. Patient is not interested in applying for food stamps right now. Asked patient if she had any other needs that I could assist her with and she responded she did not.  My contact information is attached to the email.  Ambrose Mantle [phone]

## 2019-05-06 ENCOUNTER — Other Ambulatory Visit: Payer: Self-pay | Admitting: Internal Medicine

## 2019-05-06 DIAGNOSIS — Z803 Family history of malignant neoplasm of breast: Secondary | ICD-10-CM | POA: Diagnosis not present

## 2019-05-06 DIAGNOSIS — Z1231 Encounter for screening mammogram for malignant neoplasm of breast: Secondary | ICD-10-CM | POA: Diagnosis not present

## 2019-05-06 DIAGNOSIS — J3089 Other allergic rhinitis: Secondary | ICD-10-CM

## 2019-05-06 DIAGNOSIS — K219 Gastro-esophageal reflux disease without esophagitis: Secondary | ICD-10-CM

## 2019-05-11 ENCOUNTER — Ambulatory Visit: Payer: Self-pay | Admitting: Pharmacist

## 2019-05-11 DIAGNOSIS — E118 Type 2 diabetes mellitus with unspecified complications: Secondary | ICD-10-CM

## 2019-05-11 NOTE — Patient Instructions (Signed)
Thank you allowing the Care Management Team to be a part of your care! It was a pleasure speaking with you today!     Care Management Team    Janci Minor RN, BSN Nurse Care Coordinator  [phone]   Harlow Asa PharmD  Clinical Pharmacist  ([place] LCSW Clinical Social Worker [phone]  Visit Information  Goals Addressed            This Visit's Progress   . PharmD - Diabetes Education       Current Barriers:  . Chronic Disease Management support, education, and care coordination needs related to HTN, HLD, and DMII  Pharmacist Clinical Goal(s):  [name] Over the next 1 days, patient will work with CM Pharmacist to address needs related to diabetes management education  Interventions: . Address patient's questions related to the Dexcom Continuous Glucose Monitoring (CGM) System eligibility requirements through her health plan coverage.  o Based on information from patient and chart review, patient does not currently meet Medicare Coverage Criteria for this type of device . Provide patient with education on basics of type 2 diabetes mellitus diease state as well as diabetes medication management . Counsel patient on importance of blood sugar control o Reports currently taking glimepiride 2 mg once daily before breakfast o Reports morning fasting CBGs ranging 120-140s o Denies any recent low blood sugars.  o Counsel on role of nutrition and exercise in blood sugar control - Encourage patient in goal of walking 30 minutes/day x 5 days/week o Counsel on importance of regular well-balance meals and control of carbohydrate portion size. [name] Denies further medication questions/concerns. . Patient declines offer for future outreach from [facility] Pharmacist.  Patient Self Care Activities:  . Calls provider office for new concerns or questions  Initial goal documentation        The patient verbalized understanding of instructions provided today and  declined a print copy of patient instruction materials.    Patient declines offer for future outreach from CM Pharmacist at this time.  Harlow Asa, PharmD, [place] [phone]

## 2019-05-11 NOTE — Chronic Care Management (AMB) (Signed)
  Care Management   Follow Up Note   05/11/2019 Name: Kathy Dougherty MRN: [id number] DOB: 06/07/1950  Referred by: [name], MD Reason for referral : Chronic Care Management (Initial Patient Outreach Call)   Kathy Dougherty is a 68 y.o. year old female who is a primary care patient of [name], MD. The care management team was consulted for assistance with care management and care coordination needs.    Receive referral from [place] requesting outreach to patient for diabetes education and information on continuous blood glucose monitoring system.  Outreach to [employer] by phone today.  Review of patient status, including review of consultants reports, relevant laboratory and other test results, and collaboration with appropriate care team members and the patient's provider was performed as part of comprehensive patient evaluation and provision of chronic care management services.      Advanced Directives: See Care Plan and Vynca application for related entries.   Goals Addressed            This Visit's Progress   . PharmD - Diabetes Education       Current Barriers:  . Chronic Disease Management support, education, and care coordination needs related to HTN, HLD, and DMII  Pharmacist Clinical Goal(s):  [name] Over the next 1 days, patient will work with CM Pharmacist to address needs related to diabetes management education  Interventions: . Address patient's questions related to the Dexcom Continuous Glucose Monitoring (CGM) System eligibility requirements through her health plan coverage.  o Based on information from patient and chart review, patient does not currently meet Medicare Coverage Criteria for this type of device . Provide patient with education on basics of type 2 diabetes mellitus diease state as well as diabetes medication management . Counsel patient on importance of blood sugar control o Reports currently taking glimepiride 2 mg  once daily before breakfast o Reports morning fasting CBGs ranging 120-140s o Denies any recent low blood sugars.  o Counsel on role of nutrition and exercise in blood sugar control - Encourage patient in goal of walking 30 minutes/day x 5 days/week o Counsel on importance of regular well-balance meals and control of carbohydrate portion size. [name] Denies further medication questions/concerns. . Patient declines offer for future outreach from [facility] Pharmacist.  Patient Self Care Activities:  . Calls provider office for new concerns or questions  Initial goal documentation        Plan  1) Patient declines offer for future outreach from CM Pharmacist at this time 2) The care management team is available to follow up with the patient after provider conversation with the patient regarding recommendation for care management engagement and subsequent re-referral to the care management team.   Harlow Asa, PharmD, [place] [phone]

## 2019-05-19 ENCOUNTER — Other Ambulatory Visit: Payer: Self-pay | Admitting: Internal Medicine

## 2019-05-19 DIAGNOSIS — Z1231 Encounter for screening mammogram for malignant neoplasm of breast: Secondary | ICD-10-CM

## 2019-06-08 ENCOUNTER — Ambulatory Visit: Payer: Medicare HMO | Attending: Internal Medicine

## 2019-06-08 DIAGNOSIS — Z20822 Contact with and (suspected) exposure to covid-19: Secondary | ICD-10-CM

## 2019-06-09 LAB — NOVEL CORONAVIRUS, NAA: SARS-CoV-2, NAA: NOT DETECTED

## 2019-06-10 ENCOUNTER — Telehealth: Payer: Self-pay | Admitting: General Practice

## 2019-06-10 NOTE — Telephone Encounter (Signed)
Negative COVID results given. Patient results "NOT Detected." Caller expressed understanding. ° °

## 2019-06-22 ENCOUNTER — Ambulatory Visit: Payer: Medicare HMO | Admitting: Orthotics

## 2019-06-24 ENCOUNTER — Ambulatory Visit: Payer: Medicare HMO | Attending: Internal Medicine

## 2019-06-24 DIAGNOSIS — Z23 Encounter for immunization: Secondary | ICD-10-CM | POA: Insufficient documentation

## 2019-06-24 NOTE — Progress Notes (Signed)
   Covid-19 Vaccination Clinic  Name:  Kathy Dougherty    MRN: [id number] DOB: 26-Mar-1951  06/24/2019  Kathy Dougherty was observed post Covid-19 immunization for 15 minutes without incidence. She was provided with Vaccine Information Sheet and instruction to access the V-Safe system.   Kathy Dougherty was instructed to call 911 with any severe reactions post vaccine: [name] Difficulty breathing  . Swelling of your face and throat  . A fast heartbeat  . A bad rash all over your body  . Dizziness and weakness    Immunizations Administered    Name Date Dose VIS Date Route   Moderna COVID-19 Vaccine 06/24/2019  3:54 PM 0.5 mL 04/19/2019 Intramuscular   Manufacturer: Moderna   Lot: [id number]   [id number]

## 2019-07-11 ENCOUNTER — Encounter: Payer: Self-pay | Admitting: Internal Medicine

## 2019-07-11 ENCOUNTER — Ambulatory Visit (INDEPENDENT_AMBULATORY_CARE_PROVIDER_SITE_OTHER): Payer: Medicare HMO | Admitting: Internal Medicine

## 2019-07-11 ENCOUNTER — Other Ambulatory Visit: Payer: Self-pay

## 2019-07-11 VITALS — BP 112/68 | HR 87 | Temp 98.5°F | Ht 62.0 in | Wt 181.0 lb

## 2019-07-11 DIAGNOSIS — B373 Candidiasis of vulva and vagina: Secondary | ICD-10-CM | POA: Diagnosis not present

## 2019-07-11 DIAGNOSIS — B3731 Acute candidiasis of vulva and vagina: Secondary | ICD-10-CM

## 2019-07-11 DIAGNOSIS — J0100 Acute maxillary sinusitis, unspecified: Secondary | ICD-10-CM | POA: Diagnosis not present

## 2019-07-11 MED ORDER — FLUCONAZOLE 100 MG PO TABS: 100.0000 mg | ORAL_TABLET | Freq: Once | ORAL | 0 refills | Status: AC

## 2019-07-11 MED ORDER — DOXYCYCLINE HYCLATE 100 MG PO TABS: 100.0000 mg | ORAL_TABLET | Freq: Two times a day (BID) | ORAL | 0 refills | Status: AC

## 2019-07-11 NOTE — Progress Notes (Signed)
Date:  07/11/2019   Name:  Kathy Dougherty   DOB:  02/08/1951   MRN:  [id number]   Chief Complaint: Sinusitis (No fever. No cough. No exposure to Covid. Drainage in her throat and facial pressure. Started 4-5 days ago. Headache.)  Sinus Problem This is a new problem. The current episode started in the past 7 days. There has been no fever. Associated symptoms include congestion, headaches and sinus pressure. Pertinent negatives include no chills, coughing, ear pain, shortness of breath or sore throat.    Lab Results  Component Value Date   CREATININE 0.80 04/18/2019   BUN 14 04/18/2019   NA 139 04/18/2019   K 3.8 04/18/2019   CL 100 04/18/2019   CO2 25 04/18/2019   Lab Results  Component Value Date   CHOL 105 04/18/2019   HDL 37 (L) 04/18/2019   LDLCALC 55 04/18/2019   TRIG 60 04/18/2019   CHOLHDL 2.8 04/18/2019   Lab Results  Component Value Date   TSH 1.680 04/18/2019   Lab Results  Component Value Date   HGBA1C 7.9 (H) 04/18/2019     Review of Systems  Constitutional: Negative for chills, fatigue and fever.  HENT: Positive for congestion, sinus pressure and sinus pain. Negative for ear pain, rhinorrhea and sore throat.   Respiratory: Negative for cough and shortness of breath.   Cardiovascular: Negative for chest pain.  Neurological: Positive for headaches. Negative for dizziness and light-headedness.  Psychiatric/Behavioral: Positive for dysphoric mood and sleep disturbance.    Patient Active Problem List   Diagnosis Date Noted  . Hyperlipidemia associated with type 2 diabetes mellitus ([place]) 03/26/2017  . 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 Pre-ulcerative calluses 03/26/2017  . Arthritis of right knee 01/15/2016  . DM (diabetes mellitus), type 2 with complications ([place]) [id number]  . Abnormal LFTs 03/15/2015  . Essential (primary) hypertension 03/15/2015  . Gastro-esophageal reflux disease without esophagitis 03/15/2015  . Allergic rhinitis, seasonal  03/15/2015    Allergies  Allergen Reactions  . Ace Inhibitors Cough  . Metformin And Related Diarrhea  . Penicillins Rash  . Sulfa Antibiotics Rash    Past Surgical History:  Procedure Laterality Date  . CATARACT EXTRACTION Right 08/2014  . CATARACT EXTRACTION Left 04/2016  . HAMMER TOE SURGERY    . PARTIAL HYSTERECTOMY     fibroids    Social History   Tobacco Use  . Smoking status: Never Smoker  . Smokeless tobacco: Never Used  Substance Use Topics  . Alcohol use: No    Alcohol/week: 0.0 standard drinks  . Drug use: No     Medication list has been reviewed and updated.  Current Meds  Medication Sig  . amLODipine (NORVASC) 2.5 MG tablet Take 1 tablet (2.5 mg total) by mouth daily.  [name] glimepiride (AMARYL) 2 MG tablet Take 1 tablet (2 mg total) by mouth daily before breakfast.  . glucose blood (ONE TOUCH ULTRA TEST) test strip E11.9  . hydrochlorothiazide (HYDRODIURIL) 25 MG tablet Take 1 tablet (25 mg total) by mouth daily.  [name] ibuprofen (ADVIL,MOTRIN) 600 MG tablet TAKE 1 TABLET (600 MG TOTAL) BY MOUTH EVERY 8 (EIGHT) HOURS AS NEEDED.  [name] Lancets (ONETOUCH ULTRASOFT) lancets Use as instructed  . levocetirizine (XYZAL) 5 MG tablet TAKE 1 TABLET BY MOUTH EVERY DAY IN THE EVENING  . losartan (COZAAR) 50 MG tablet TAKE 1 TABLET BY MOUTH EVERY DAY  . omeprazole (PRILOSEC) 40 MG capsule TAKE 1 CAPSULE BY MOUTH EVERY DAY  .  simvastatin (ZOCOR) 10 MG tablet TAKE 1 TABLET BY MOUTH EVERYDAY AT BEDTIME    PHQ 2/9 Scores 07/11/2019 04/25/2019 04/18/2019 02/28/2019  PHQ - 2 Score 0 2 0 0  PHQ- 9 Score 4 5 3 6     BP Readings from Last 3 Encounters:  07/11/19 112/68  04/18/19 122/70  02/28/19 138/78    Physical Exam Constitutional:      Appearance: She is well-developed.  HENT:     Right Ear: Ear canal and external ear normal. Tympanic membrane is not erythematous or retracted.     Left Ear: Ear canal and external ear normal. Tympanic membrane is not erythematous or  retracted.     Nose:     Right Sinus: Maxillary sinus tenderness and frontal sinus tenderness present.     Left Sinus: Maxillary sinus tenderness and frontal sinus tenderness present.  Cardiovascular:     Rate and Rhythm: Normal rate and regular rhythm.     Heart sounds: Normal heart sounds.  Pulmonary:     Breath sounds: Normal breath sounds. No wheezing or rales.  Lymphadenopathy:     Cervical: No cervical adenopathy.  Neurological:     Mental Status: She is alert and oriented to person, place, and time.     Wt Readings from Last 3 Encounters:  07/11/19 181 lb (82.1 kg)  04/25/19 179 lb (81.2 kg)  04/18/19 179 lb (81.2 kg)    BP 112/68   Pulse 87   Temp 98.5 F (36.9 C) (Oral)   Ht 5\' 2"  (1.575 m)   Wt 181 lb (82.1 kg)   SpO2 97%   BMI 33.11 kg/m   Assessment and Plan: 1. Acute non-recurrent maxillary sinusitis Continue sudafed, fluids Can also use saline nasal spray or ointment - doxycycline (VIBRA-TABS) 100 MG tablet; Take 1 tablet (100 mg total) by mouth 2 (two) times daily for 10 days.  Dispense: 20 tablet; Refill: 0  2. Vaginal yeast infection Usually gets yeast infections after antibiotics. - fluconazole (DIFLUCAN) 100 MG tablet; Take 1 tablet (100 mg total) by mouth once for 1 dose.  Dispense: 1 tablet; Refill: 0   Partially dictated using Editor, commissioning. Any errors are unintentional.  [name], MD [facility]  07/11/2019

## 2019-07-12 ENCOUNTER — Ambulatory Visit (INDEPENDENT_AMBULATORY_CARE_PROVIDER_SITE_OTHER): Payer: Medicare HMO | Admitting: Orthotics

## 2019-07-12 ENCOUNTER — Other Ambulatory Visit: Payer: Self-pay

## 2019-07-12 DIAGNOSIS — E1142 Type 2 diabetes mellitus with diabetic polyneuropathy: Secondary | ICD-10-CM

## 2019-07-12 DIAGNOSIS — Q828 Other specified congenital malformations of skin: Secondary | ICD-10-CM

## 2019-07-12 DIAGNOSIS — M2041 Other hammer toe(s) (acquired), right foot: Secondary | ICD-10-CM

## 2019-07-12 DIAGNOSIS — M2042 Other hammer toe(s) (acquired), left foot: Secondary | ICD-10-CM

## 2019-07-12 NOTE — Progress Notes (Signed)

## 2019-07-26 ENCOUNTER — Ambulatory Visit: Payer: Medicare HMO | Attending: Internal Medicine

## 2019-07-26 DIAGNOSIS — Z23 Encounter for immunization: Secondary | ICD-10-CM | POA: Insufficient documentation

## 2019-07-26 NOTE — Progress Notes (Signed)
   Covid-19 Vaccination Clinic  Name:  Kathy Dougherty    MRN: [id number] DOB: 11/04/1950  07/26/2019  Kathy Dougherty was observed post Covid-19 immunization for 15 minutes without incident. She was provided with Vaccine Information Sheet and instruction to access the V-Safe system.   Kathy Dougherty was instructed to call 911 with any severe reactions post vaccine: [name] Difficulty breathing  . Swelling of face and throat  . A fast heartbeat  . A bad rash all over body  . Dizziness and weakness   Immunizations Administered    Name Date Dose VIS Date Route   Moderna COVID-19 Vaccine 07/26/2019  3:41 PM 0.5 mL 04/19/2019 Intramuscular   Manufacturer: Moderna   Lot: [id number]   [place]:[id number]

## 2019-07-28 ENCOUNTER — Other Ambulatory Visit: Payer: Self-pay | Admitting: Internal Medicine

## 2019-07-28 DIAGNOSIS — I10 Essential (primary) hypertension: Secondary | ICD-10-CM

## 2019-07-28 DIAGNOSIS — E119 Type 2 diabetes mellitus without complications: Secondary | ICD-10-CM

## 2019-08-05 ENCOUNTER — Other Ambulatory Visit: Payer: Self-pay

## 2019-08-05 ENCOUNTER — Encounter: Payer: Self-pay | Admitting: Internal Medicine

## 2019-08-05 ENCOUNTER — Ambulatory Visit (INDEPENDENT_AMBULATORY_CARE_PROVIDER_SITE_OTHER): Payer: Medicare HMO | Admitting: Internal Medicine

## 2019-08-05 VITALS — BP 128/80 | HR 68 | Temp 96.4°F | Ht 62.0 in | Wt 181.0 lb

## 2019-08-05 DIAGNOSIS — E118 Type 2 diabetes mellitus with unspecified complications: Secondary | ICD-10-CM

## 2019-08-05 DIAGNOSIS — L851 Acquired keratosis [keratoderma] palmaris et plantaris: Secondary | ICD-10-CM | POA: Diagnosis not present

## 2019-08-05 DIAGNOSIS — Q828 Other specified congenital malformations of skin: Secondary | ICD-10-CM | POA: Insufficient documentation

## 2019-08-05 DIAGNOSIS — R0981 Nasal congestion: Secondary | ICD-10-CM

## 2019-08-05 DIAGNOSIS — I10 Essential (primary) hypertension: Secondary | ICD-10-CM

## 2019-08-05 MED ORDER — AMLODIPINE BESYLATE 2.5 MG PO TABS: 2.5000 mg | ORAL_TABLET | Freq: Every day | ORAL | 3 refills | Status: DC

## 2019-08-05 MED ORDER — MOMETASONE FUROATE 50 MCG/ACT NA SUSP: 2.0000 | Freq: Every day | NASAL | 5 refills | Status: DC

## 2019-08-05 MED ORDER — GLIMEPIRIDE 2 MG PO TABS: 2.0000 mg | ORAL_TABLET | Freq: Every day | ORAL | 3 refills | Status: DC

## 2019-08-05 NOTE — Progress Notes (Signed)
Date:  08/05/2019   Name:  Kathy Dougherty   DOB:  1950/06/21   MRN:  [id number]   Chief Complaint: Diabetes (4 month follow up.) and Hypertension  Diabetes She presents for her follow-up diabetic visit. She has type 2 diabetes mellitus. Her disease course has been stable. Pertinent negatives for hypoglycemia include no headaches or tremors. Pertinent negatives for diabetes include no chest pain, no fatigue, no polydipsia and no polyuria. There are no hypoglycemic complications. Current diabetic treatment includes oral agent (monotherapy) (glimepiride). She is compliant with treatment all of the time. There is no compliance with monitoring of blood glucose. An ACE inhibitor/angiotensin II receptor blocker is being taken.  Hypertension This is a chronic problem. The problem is controlled (at home 130/80). Pertinent negatives include no chest pain, headaches, palpitations or shortness of breath. Past treatments include angiotensin blockers, diuretics and calcium channel blockers. The current treatment provides significant improvement.    Lab Results  Component Value Date   CREATININE 0.80 04/18/2019   BUN 14 04/18/2019   NA 139 04/18/2019   K 3.8 04/18/2019   CL 100 04/18/2019   CO2 25 04/18/2019   Lab Results  Component Value Date   CHOL 105 04/18/2019   HDL 37 (L) 04/18/2019   LDLCALC 55 04/18/2019   TRIG 60 04/18/2019   CHOLHDL 2.8 04/18/2019   Lab Results  Component Value Date   TSH 1.680 04/18/2019   Lab Results  Component Value Date   HGBA1C 7.9 (H) 04/18/2019   Lab Results  Component Value Date   WBC 5.1 04/18/2019   HGB 12.4 04/18/2019   HCT 38.0 04/18/2019   MCV 86 04/18/2019   PLT 208 04/18/2019   Lab Results  Component Value Date   ALT 16 04/18/2019   AST 23 04/18/2019   ALKPHOS 85 04/18/2019   BILITOT 0.4 04/18/2019     Review of Systems  Constitutional: Negative for appetite change, fatigue, fever and unexpected weight change.  HENT: Negative for  tinnitus and trouble swallowing.   Eyes: Negative for visual disturbance.  Respiratory: Negative for cough, chest tightness and shortness of breath.   Cardiovascular: Negative for chest pain, palpitations and leg swelling.  Gastrointestinal: Negative for abdominal pain.  Endocrine: Negative for polydipsia and polyuria.  Genitourinary: Negative for dysuria and hematuria.  Musculoskeletal: Negative for arthralgias.  Neurological: Negative for tremors, numbness and headaches.  Psychiatric/Behavioral: Negative for dysphoric mood.    Patient Active Problem List   Diagnosis Date Noted  . Hyperlipidemia associated with type 2 diabetes mellitus ([place]) 03/26/2017  . 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 Pre-ulcerative calluses 03/26/2017  . Arthritis of right knee 01/15/2016  . DM (diabetes mellitus), type 2 with complications ([place]) [id number]  . Abnormal LFTs 03/15/2015  . Essential (primary) hypertension 03/15/2015  . Gastro-esophageal reflux disease without esophagitis 03/15/2015  . Allergic rhinitis, seasonal 03/15/2015    Allergies  Allergen Reactions  . Ace Inhibitors Cough  . Metformin And Related Diarrhea  . Penicillins Rash  . Sulfa Antibiotics Rash    Past Surgical History:  Procedure Laterality Date  . CATARACT EXTRACTION Right 08/2014  . CATARACT EXTRACTION Left 04/2016  . HAMMER TOE SURGERY    . PARTIAL HYSTERECTOMY     fibroids    Social History   Tobacco Use  . Smoking status: Never Smoker  . Smokeless tobacco: Never Used  Substance Use Topics  . Alcohol use: No    Alcohol/week: 0.0 standard drinks  . Drug use:  No     Medication list has been reviewed and updated.  Current Meds  Medication Sig  . amLODipine (NORVASC) 2.5 MG tablet Take 1 tablet (2.5 mg total) by mouth daily.  [name] glimepiride (AMARYL) 2 MG tablet Take 1 tablet (2 mg total) by mouth daily before breakfast.  . glucose blood (ONE TOUCH ULTRA TEST) test strip E11.9  . hydrochlorothiazide  (HYDRODIURIL) 25 MG tablet Take 1 tablet (25 mg total) by mouth daily.  [name] ibuprofen (ADVIL,MOTRIN) 600 MG tablet TAKE 1 TABLET (600 MG TOTAL) BY MOUTH EVERY 8 (EIGHT) HOURS AS NEEDED.  [name] Lancets (ONETOUCH ULTRASOFT) lancets Use as instructed  . levocetirizine (XYZAL) 5 MG tablet TAKE 1 TABLET BY MOUTH EVERY DAY IN THE EVENING  . losartan (COZAAR) 50 MG tablet TAKE 1 TABLET BY MOUTH EVERY DAY  . omeprazole (PRILOSEC) 40 MG capsule TAKE 1 CAPSULE BY MOUTH EVERY DAY  . simvastatin (ZOCOR) 10 MG tablet TAKE 1 TABLET BY MOUTH EVERYDAY AT BEDTIME    PHQ 2/9 Scores 08/05/2019 07/11/2019 04/25/2019 04/18/2019  PHQ - 2 Score 0 0 2 0  PHQ- 9 Score 6 4 5 3     BP Readings from Last 3 Encounters:  08/05/19 128/80  07/11/19 112/68  04/18/19 122/70    Physical Exam Vitals and nursing note reviewed.  Constitutional:      General: She is not in acute distress.    Appearance: She is well-developed.  HENT:     Head: Normocephalic and atraumatic.  Cardiovascular:     Rate and Rhythm: Normal rate and regular rhythm.     Pulses: Normal pulses.  Pulmonary:     Effort: Pulmonary effort is normal. No respiratory distress.  Musculoskeletal:     Cervical back: Normal range of motion.     Right lower leg: No edema.     Left lower leg: No edema.  Lymphadenopathy:     Cervical: No cervical adenopathy.  Skin:    General: Skin is warm and dry.     Capillary Refill: Capillary refill takes less than 2 seconds.     Findings: No rash.  Neurological:     Mental Status: She is alert and oriented to person, place, and time.  Psychiatric:        Mood and Affect: Mood normal.        Behavior: Behavior normal.        Thought Content: Thought content normal.     Wt Readings from Last 3 Encounters:  08/05/19 181 lb (82.1 kg)  07/11/19 181 lb (82.1 kg)  04/25/19 179 lb (81.2 kg)    BP 128/80   Pulse 68   Temp (!) 96.4 F (35.8 C) (Temporal)   Ht 5\' 2"  (1.575 m)   Wt 181 lb (82.1 kg)   SpO2 98%   BMI  33.11 kg/m   Assessment and Plan: 1. DM (diabetes mellitus), type 2 with complications ([place]) Clinically stable by exam and report without s/s of hypoglycemia. DM complicated by HTN. Tolerating medications - glimepiride -  well without side effects or other concerns. - Hemoglobin A1c - glimepiride (AMARYL) 2 MG tablet; Take 1 tablet (2 mg total) by mouth daily before breakfast.  Dispense: 90 tablet; Refill: 3  2. Essential (primary) hypertension Clinically stable exam with well controlled BP. Tolerating medications without side effects at this time. Pt to continue current regimen and low sodium diet; benefits of regular exercise as able discussed. - amLODipine (NORVASC) 2.5 MG tablet; Take 1 tablet (2.5 mg  total) by mouth daily.  Dispense: 90 tablet; Refill: 3  3. Keratoderma of foot, acquired Seeing podiatry Now using orthotics  4. Nasal sinus congestion - mometasone (NASONEX) 50 MCG/ACT nasal spray; Place 2 sprays into the nose daily.  Dispense: 17 g; Refill: 5   Partially dictated using Editor, commissioning. Any errors are unintentional.  [name], MD [facility]  08/05/2019

## 2019-08-06 LAB — HEMOGLOBIN A1C
Est. average glucose Bld gHb Est-mCnc: 217 mg/dL
Hgb A1c MFr Bld: 9.2 % — ABNORMAL HIGH (ref 4.8–5.6)

## 2019-09-19 ENCOUNTER — Other Ambulatory Visit: Payer: Self-pay | Admitting: Internal Medicine

## 2019-09-19 DIAGNOSIS — I10 Essential (primary) hypertension: Secondary | ICD-10-CM

## 2019-11-07 ENCOUNTER — Telehealth: Payer: Self-pay | Admitting: Internal Medicine

## 2019-11-07 NOTE — Telephone Encounter (Signed)
Copied from [place] [id number]. Topic: General - Inquiry >> Nov 07, 2019 12:44 PM Alease Frame wrote: Reason for CRM: Pt is wanting a call back from [place] nurse to discuss medical options . Please advise

## 2019-11-07 NOTE — Telephone Encounter (Signed)
Patient is scheduled to come in July 1 if her symptoms get worse throughout the week she had been advised to go th ER.

## 2019-11-07 NOTE — Telephone Encounter (Signed)
Patient states that she has been feeling light headed with an upset stomach. Patient states this has been on and off and primarily happens when she wakes up in the morning. She states there is no abdominal pain, just nausea. Patient is requesting to speak with clinical staff to discuss.

## 2019-11-11 DIAGNOSIS — E119 Type 2 diabetes mellitus without complications: Secondary | ICD-10-CM | POA: Diagnosis not present

## 2019-11-11 DIAGNOSIS — Z961 Presence of intraocular lens: Secondary | ICD-10-CM | POA: Diagnosis not present

## 2019-11-11 LAB — HM DIABETES EYE EXAM

## 2019-11-17 ENCOUNTER — Other Ambulatory Visit: Payer: Self-pay

## 2019-11-17 ENCOUNTER — Ambulatory Visit (INDEPENDENT_AMBULATORY_CARE_PROVIDER_SITE_OTHER): Payer: Medicare HMO | Admitting: Internal Medicine

## 2019-11-17 ENCOUNTER — Encounter: Payer: Self-pay | Admitting: Internal Medicine

## 2019-11-17 VITALS — BP 132/76 | HR 82 | Temp 97.8°F | Ht 62.0 in | Wt 181.0 lb

## 2019-11-17 DIAGNOSIS — R109 Unspecified abdominal pain: Secondary | ICD-10-CM

## 2019-11-17 DIAGNOSIS — K5901 Slow transit constipation: Secondary | ICD-10-CM

## 2019-11-17 LAB — POCT URINALYSIS DIPSTICK
Bilirubin, UA: NEGATIVE
Blood, UA: NEGATIVE
Glucose, UA: NEGATIVE
Ketones, UA: NEGATIVE
Leukocytes, UA: NEGATIVE
Nitrite, UA: NEGATIVE
Protein, UA: NEGATIVE
Spec Grav, UA: 1.01 (ref 1.010–1.025)
Urobilinogen, UA: 0.2 E.U./dL
pH, UA: 5 (ref 5.0–8.0)

## 2019-11-17 NOTE — Patient Instructions (Signed)
Take Colace 1 tablet daily to control constipation.

## 2019-11-17 NOTE — Progress Notes (Signed)
Date:  11/17/2019   Name:  Kathy Dougherty   DOB:  [birth year]   MRN:  [id number]   Chief Complaint: Abdominal Pain (X 1 week - Back pain, and bloated. More flatulence. Said she was not having normal BMs in the last week - Yesterday was last BM. First normal one in a week. )  Abdominal Pain This is a new problem. The current episode started in the past 7 days. The problem has been rapidly improving. The pain is located in the generalized abdominal region. The patient is experiencing no pain. The quality of the pain is a sensation of fullness. The abdominal pain radiates to the back. Associated symptoms include constipation. Pertinent negatives include no fever.    Lab Results  Component Value Date   CREATININE 0.80 04/18/2019   BUN 14 04/18/2019   NA 139 04/18/2019   K 3.8 04/18/2019   CL 100 04/18/2019   CO2 25 04/18/2019   Lab Results  Component Value Date   CHOL 105 04/18/2019   HDL 37 (L) 04/18/2019   LDLCALC 55 04/18/2019   TRIG 60 04/18/2019   CHOLHDL 2.8 04/18/2019   Lab Results  Component Value Date   TSH 1.680 04/18/2019   Lab Results  Component Value Date   HGBA1C 9.2 (H) 08/05/2019   Lab Results  Component Value Date   WBC 5.1 04/18/2019   HGB 12.4 04/18/2019   HCT 38.0 04/18/2019   MCV 86 04/18/2019   PLT 208 04/18/2019   Lab Results  Component Value Date   ALT 16 04/18/2019   AST 23 04/18/2019   ALKPHOS 85 04/18/2019   BILITOT 0.4 04/18/2019     Review of Systems  Constitutional: Negative for chills, fever and unexpected weight change.  Respiratory: Negative for chest tightness and shortness of breath.   Cardiovascular: Negative for chest pain and palpitations.  Gastrointestinal: Positive for abdominal pain and constipation. Negative for blood in stool.    Patient Active Problem List   Diagnosis Date Noted  . 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 Hyperlipidemia associated with type 2 diabetes mellitus ([place]) 03/26/2017  . Iritis of  left eye 03/26/2017  . Pre-ulcerative calluses 03/26/2017  . Arthritis of right knee 01/15/2016  . DM (diabetes mellitus), type 2 with complications ([place]) 12/87/8676  . Abnormal LFTs 03/15/2015  . Essential (primary) hypertension 03/15/2015  . Gastro-esophageal reflux disease without esophagitis 03/15/2015  . Allergic rhinitis, seasonal 03/15/2015    Allergies  Allergen Reactions  . Ace Inhibitors Cough  . Metformin And Related Diarrhea  . Penicillins Rash  . Sulfa Antibiotics Rash    Past Surgical History:  Procedure Laterality Date  . CATARACT EXTRACTION Right 08/2014  . CATARACT EXTRACTION Left 04/2016  . HAMMER TOE SURGERY    . PARTIAL HYSTERECTOMY     fibroids    Social History   Tobacco Use  . Smoking status: Never Smoker  . Smokeless tobacco: Never Used  Vaping Use  . Vaping Use: Never used  Substance Use Topics  . Alcohol use: No    Alcohol/week: 0.0 standard drinks  . Drug use: No     Medication list has been reviewed and updated.  Current Meds  Medication Sig  . amLODipine (NORVASC) 2.5 MG tablet Take 1 tablet (2.5 mg total) by mouth daily.  [name] glimepiride (AMARYL) 2 MG tablet Take 1 tablet (2 mg total) by mouth daily before breakfast.  . glucose blood (ONE TOUCH ULTRA TEST) test strip E11.9  .  hydrochlorothiazide (HYDRODIURIL) 25 MG tablet TAKE 1 TABLET BY MOUTH EVERY DAY  . ibuprofen (ADVIL,MOTRIN) 600 MG tablet TAKE 1 TABLET (600 MG TOTAL) BY MOUTH EVERY 8 (EIGHT) HOURS AS NEEDED.  [name] Lancets (ONETOUCH ULTRASOFT) lancets Use as instructed  . levocetirizine (XYZAL) 5 MG tablet TAKE 1 TABLET BY MOUTH EVERY DAY IN THE EVENING  . losartan (COZAAR) 50 MG tablet TAKE 1 TABLET BY MOUTH EVERY DAY  . mometasone (NASONEX) 50 MCG/ACT nasal spray Place 2 sprays into the nose daily.  [name] omeprazole (PRILOSEC) 40 MG capsule TAKE 1 CAPSULE BY MOUTH EVERY DAY  . simvastatin (ZOCOR) 10 MG tablet TAKE 1 TABLET BY MOUTH EVERYDAY AT BEDTIME    PHQ 2/9 Scores 11/17/2019  08/05/2019 07/11/2019 04/25/2019  PHQ - 2 Score 0 0 0 2  PHQ- 9 Score 4 6 4 5     GAD 7 : Generalized Anxiety Score 11/17/2019  Nervous, Anxious, on Edge 0  Control/stop worrying 0  Worry too much - different things 0  Trouble relaxing 0  Restless 0  Easily annoyed or irritable 0  Afraid - awful might happen 0  Total GAD 7 Score 0  Anxiety Difficulty Not difficult at all    BP Readings from Last 3 Encounters:  11/17/19 132/76  08/05/19 128/80  07/11/19 112/68    Physical Exam Vitals and nursing note reviewed.  Constitutional:      General: She is not in acute distress.    Appearance: She is well-developed.  HENT:     Head: Normocephalic and atraumatic.  Cardiovascular:     Rate and Rhythm: Normal rate and regular rhythm.  Pulmonary:     Effort: Pulmonary effort is normal. No respiratory distress.     Breath sounds: No wheezing or rhonchi.  Abdominal:     General: Abdomen is protuberant. Bowel sounds are normal. There is no distension.     Palpations: Abdomen is soft.     Tenderness: There is no abdominal tenderness. There is no right CVA tenderness or left CVA tenderness.  Musculoskeletal:        General: Normal range of motion.  Skin:    General: Skin is warm and dry.     Findings: No rash.  Neurological:     Mental Status: She is alert and oriented to person, place, and time.  Psychiatric:        Behavior: Behavior normal.        Thought Content: Thought content normal.     Wt Readings from Last 3 Encounters:  11/17/19 181 lb (82.1 kg)  08/05/19 181 lb (82.1 kg)  07/11/19 181 lb (82.1 kg)    BP 132/76 (BP Location: Right Arm, Patient Position: Sitting, Cuff Size: Normal)   Pulse 82   Temp 97.8 F (36.6 C) (Oral)   Ht 5\' 2"  (1.575 m)   Wt 181 lb (82.1 kg)   SpO2 98%   BMI 33.11 kg/m   Assessment and Plan: 1. Slow transit constipation Begin colace daily - adjust to 2-3 stools per day  2. Abdominal discomfort in left flank UA negative. - POCT  urinalysis dipstick   Partially dictated using Editor, commissioning. Any errors are unintentional.  Kathy Maidens, MD [facility]  11/17/2019

## 2019-12-08 ENCOUNTER — Ambulatory Visit (INDEPENDENT_AMBULATORY_CARE_PROVIDER_SITE_OTHER): Payer: Medicare HMO | Admitting: Internal Medicine

## 2019-12-08 ENCOUNTER — Other Ambulatory Visit: Payer: Self-pay | Admitting: Internal Medicine

## 2019-12-08 ENCOUNTER — Other Ambulatory Visit: Payer: Self-pay

## 2019-12-08 ENCOUNTER — Encounter: Payer: Self-pay | Admitting: Internal Medicine

## 2019-12-08 VITALS — BP 132/82 | HR 76 | Temp 97.9°F | Ht 62.0 in | Wt 176.0 lb

## 2019-12-08 DIAGNOSIS — Z1231 Encounter for screening mammogram for malignant neoplasm of breast: Secondary | ICD-10-CM | POA: Diagnosis not present

## 2019-12-08 DIAGNOSIS — E118 Type 2 diabetes mellitus with unspecified complications: Secondary | ICD-10-CM

## 2019-12-08 DIAGNOSIS — B351 Tinea unguium: Secondary | ICD-10-CM | POA: Diagnosis not present

## 2019-12-08 DIAGNOSIS — I10 Essential (primary) hypertension: Secondary | ICD-10-CM

## 2019-12-08 DIAGNOSIS — J301 Allergic rhinitis due to pollen: Secondary | ICD-10-CM

## 2019-12-08 MED ORDER — GLIMEPIRIDE 4 MG PO TABS: 4.0000 mg | ORAL_TABLET | Freq: Every day | ORAL | 1 refills | Status: DC

## 2019-12-08 NOTE — Patient Instructions (Signed)
Try plain Mucinex twice a day for thick phlegm.

## 2019-12-08 NOTE — Progress Notes (Signed)
Date:  12/08/2019   Name:  Kathy Dougherty   DOB:  04-08-1951   MRN:  [id number]   Chief Complaint: Diabetes (follow up ), Hypertension, and Sinusitis (cough, bend over shes light headed, congested) DM eye exam due. Diabetes She presents for her follow-up diabetic visit. She has type 2 diabetes mellitus. Her disease course has been worsening. Pertinent negatives for diabetes include no chest pain and no fatigue. Current diabetic treatment includes oral agent (monotherapy) (amaryl doubled last visit). She monitors blood glucose at home 1-2 x per week. Her breakfast blood glucose is taken between 7-8 am. Her breakfast blood glucose range is generally 140-180 mg/dl. An ACE inhibitor/angiotensin II receptor blocker is being taken.  Hypertension This is a chronic problem. The problem is controlled. Pertinent negatives include no chest pain, palpitations or shortness of breath. Past treatments include angiotensin blockers, diuretics and calcium channel blockers. The current treatment provides significant improvement.    Lab Results  Component Value Date   CREATININE 0.80 04/18/2019   BUN 14 04/18/2019   NA 139 04/18/2019   K 3.8 04/18/2019   CL 100 04/18/2019   CO2 25 04/18/2019   Lab Results  Component Value Date   CHOL 105 04/18/2019   HDL 37 (L) 04/18/2019   LDLCALC 55 04/18/2019   TRIG 60 04/18/2019   CHOLHDL 2.8 04/18/2019   Lab Results  Component Value Date   TSH 1.680 04/18/2019   Lab Results  Component Value Date   HGBA1C 9.2 (H) 08/05/2019   Lab Results  Component Value Date   WBC 5.1 04/18/2019   HGB 12.4 04/18/2019   HCT 38.0 04/18/2019   MCV 86 04/18/2019   PLT 208 04/18/2019   Lab Results  Component Value Date   ALT 16 04/18/2019   AST 23 04/18/2019   ALKPHOS 85 04/18/2019   BILITOT 0.4 04/18/2019     Review of Systems  Constitutional: Negative for chills, fatigue and fever.  Respiratory: Positive for cough (with clear phlegm). Negative for chest  tightness, shortness of breath and wheezing.   Cardiovascular: Negative for chest pain and palpitations.  Allergic/Immunologic: Positive for environmental allergies.  Neurological: Positive for light-headedness.    Patient Active Problem List   Diagnosis Date Noted  . 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 Hyperlipidemia associated with type 2 diabetes mellitus ([place]) 03/26/2017  . 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 Pre-ulcerative calluses 03/26/2017  . Arthritis of right knee 01/15/2016  . DM (diabetes mellitus), type 2 with complications ([place]) 99/35/7017  . Abnormal LFTs 03/15/2015  . Essential (primary) hypertension 03/15/2015  . Gastro-esophageal reflux disease without esophagitis 03/15/2015  . Allergic rhinitis, seasonal 03/15/2015    Allergies  Allergen Reactions  . Ace Inhibitors Cough  . Metformin And Related Diarrhea  . Penicillins Rash  . Sulfa Antibiotics Rash    Past Surgical History:  Procedure Laterality Date  . CATARACT EXTRACTION Right 08/2014  . CATARACT EXTRACTION Left 04/2016  . HAMMER TOE SURGERY    . PARTIAL HYSTERECTOMY     fibroids    Social History   Tobacco Use  . Smoking status: Never Smoker  . Smokeless tobacco: Never Used  Vaping Use  . Vaping Use: Never used  Substance Use Topics  . Alcohol use: No    Alcohol/week: 0.0 standard drinks  . Drug use: No     Medication list has been reviewed and updated.  Current Meds  Medication Sig  . amLODipine (NORVASC) 2.5 MG tablet Take  1 tablet (2.5 mg total) by mouth daily.  [name] glimepiride (AMARYL) 2 MG tablet Take 1 tablet (2 mg total) by mouth daily before breakfast.  . glucose blood (ONE TOUCH ULTRA TEST) test strip E11.9  . hydrochlorothiazide (HYDRODIURIL) 25 MG tablet TAKE 1 TABLET BY MOUTH EVERY DAY  . ibuprofen (ADVIL,MOTRIN) 600 MG tablet TAKE 1 TABLET (600 MG TOTAL) BY MOUTH EVERY 8 (EIGHT) HOURS AS NEEDED.  [name] Lancets (ONETOUCH ULTRASOFT) lancets Use as instructed  .  levocetirizine (XYZAL) 5 MG tablet TAKE 1 TABLET BY MOUTH EVERY DAY IN THE EVENING  . losartan (COZAAR) 50 MG tablet TAKE 1 TABLET BY MOUTH EVERY DAY  . mometasone (NASONEX) 50 MCG/ACT nasal spray Place 2 sprays into the nose daily.  [name] omeprazole (PRILOSEC) 40 MG capsule TAKE 1 CAPSULE BY MOUTH EVERY DAY  . simvastatin (ZOCOR) 10 MG tablet TAKE 1 TABLET BY MOUTH EVERYDAY AT BEDTIME    PHQ 2/9 Scores 12/08/2019 11/17/2019 08/05/2019 07/11/2019  PHQ - 2 Score 0 0 0 0  PHQ- 9 Score 0 4 6 4     GAD 7 : Generalized Anxiety Score 12/08/2019 11/17/2019  Nervous, Anxious, on Edge 0 0  Control/stop worrying 0 0  Worry too much - different things 0 0  Trouble relaxing 1 0  Restless 0 0  Easily annoyed or irritable 0 0  Afraid - awful might happen 0 0  Total GAD 7 Score 1 0  Anxiety Difficulty Not difficult at all Not difficult at all    BP Readings from Last 3 Encounters:  12/08/19 132/82  11/17/19 132/76  08/05/19 128/80    Physical Exam Vitals and nursing note reviewed.  Constitutional:      General: She is not in acute distress.    Appearance: Normal appearance. She is well-developed.  HENT:     Head: Normocephalic and atraumatic.     Nose:     Right Sinus: No maxillary sinus tenderness or frontal sinus tenderness.     Left Sinus: No maxillary sinus tenderness or frontal sinus tenderness.  Cardiovascular:     Rate and Rhythm: Normal rate and regular rhythm.  Pulmonary:     Effort: Pulmonary effort is normal. No respiratory distress.     Breath sounds: No wheezing or rhonchi.  Musculoskeletal:        General: Normal range of motion.     Cervical back: Normal range of motion.  Feet:     Comments: Right great toe nail very thick and yellow; base of nail dark; no drainage or tenderness Lymphadenopathy:     Cervical: No cervical adenopathy.  Skin:    General: Skin is warm and dry.     Findings: No rash.  Neurological:     Mental Status: She is alert and oriented to person, place,  and time.  Psychiatric:        Behavior: Behavior normal.        Thought Content: Thought content normal.     Wt Readings from Last 3 Encounters:  12/08/19 176 lb (79.8 kg)  11/17/19 181 lb (82.1 kg)  08/05/19 181 lb (82.1 kg)    BP 132/82   Pulse 76   Temp 97.9 F (36.6 C) (Oral)   Ht 5\' 2"  (1.575 m)   Wt 176 lb (79.8 kg)   SpO2 98%   BMI 32.19 kg/m   Assessment and Plan: 1. DM (diabetes mellitus), type 2 with complications ([place]) BS not controlled on last A1C - glimepiride doubled Will recheck  labs and advise on additional medications - Hemoglobin E1E - Basic metabolic panel  2. Essential (primary) hypertension Clinically stable exam with well controlled BP. Tolerating medications without side effects at this time. Pt to continue current regimen and low sodium diet; benefits of regular exercise as able discussed.  3. Fungal infection of nail Appears chronic but pt states is it worsening Recommend Podiatry follow up  4. Seasonal allergic rhinitis due to pollen Contributing to cough Continue Flonase and add mucinex   Partially dictated using Editor, commissioning. Any errors are unintentional.  [name], MD [facility]  12/08/2019

## 2019-12-09 LAB — BASIC METABOLIC PANEL
BUN/Creatinine Ratio: 18 (ref 12–28)
BUN: 14 mg/dL (ref 8–27)
CO2: 25 mmol/L (ref 20–29)
Calcium: 9.7 mg/dL (ref 8.7–10.3)
Chloride: 99 mmol/L (ref 96–106)
Creatinine, Ser: 0.76 mg/dL (ref 0.57–1.00)
GFR calc Af Amer: 93 mL/min/{1.73_m2} (ref >59)
GFR calc non Af Amer: 81 mL/min/{1.73_m2} (ref >59)
Glucose: 198 mg/dL — ABNORMAL HIGH (ref 65–99)
Potassium: 3.6 mmol/L (ref 3.5–5.2)
Sodium: 140 mmol/L (ref 134–144)

## 2019-12-09 LAB — HEMOGLOBIN A1C
Est. average glucose Bld gHb Est-mCnc: 203 mg/dL
Hgb A1c MFr Bld: 8.7 % — ABNORMAL HIGH (ref 4.8–5.6)

## 2019-12-16 ENCOUNTER — Telehealth: Payer: Self-pay | Admitting: Internal Medicine

## 2019-12-16 ENCOUNTER — Other Ambulatory Visit: Payer: Self-pay | Admitting: Internal Medicine

## 2019-12-16 DIAGNOSIS — R059 Cough, unspecified: Secondary | ICD-10-CM

## 2019-12-16 MED ORDER — BENZONATATE 100 MG PO CAPS: 100.0000 mg | ORAL_CAPSULE | Freq: Three times a day (TID) | ORAL | 0 refills | Status: AC

## 2019-12-16 NOTE — Telephone Encounter (Signed)
Relation to pt: self  Call back number: [phone] Pharmacy: [id number] - [place], [place] Phone:  [phone]  Fax:  [phone]       Reason for call:  Patient requesting cough medication last seen  12/08/2019 cough symptoms have not improved.

## 2019-12-16 NOTE — Telephone Encounter (Signed)
Sent in Tessalon perles

## 2020-01-09 ENCOUNTER — Telehealth: Payer: Self-pay | Admitting: Internal Medicine

## 2020-01-09 NOTE — Telephone Encounter (Signed)
Copied from [place] [id number]. Topic: General - Other >> Jan 09, 2020 10:09 AM Keene Breath wrote: Reason for CRM: Patient would like the doctor to call in a muscle relaxant for the pain she has been having in her back for about 4 days.  She was not sure if she needed an appt.  Please call patient to discuss at [phone]

## 2020-01-09 NOTE — Telephone Encounter (Signed)
I don't where I recently treated her for back pain.  Or that she ever had a muscle relaxer.  She will need an OV if she needs prescription medication. She can try heat and alternate the ibuprofen with tylenol.

## 2020-01-10 ENCOUNTER — Encounter: Payer: Self-pay | Admitting: Internal Medicine

## 2020-01-10 ENCOUNTER — Ambulatory Visit (INDEPENDENT_AMBULATORY_CARE_PROVIDER_SITE_OTHER): Payer: Medicare HMO | Admitting: Internal Medicine

## 2020-01-10 ENCOUNTER — Other Ambulatory Visit: Payer: Self-pay

## 2020-01-10 VITALS — BP 124/86 | HR 76 | Temp 98.2°F | Ht 62.0 in | Wt 176.0 lb

## 2020-01-10 DIAGNOSIS — M62830 Muscle spasm of back: Secondary | ICD-10-CM

## 2020-01-10 MED ORDER — METHOCARBAMOL 500 MG PO TABS: 500.0000 mg | ORAL_TABLET | Freq: Every day | ORAL | 0 refills | Status: DC

## 2020-01-10 NOTE — Patient Instructions (Signed)
Continue taking Ibuprofen three times per day.  Use heat several times a day to back muscles.

## 2020-01-10 NOTE — Progress Notes (Signed)
Date:  01/10/2020   Name:  Kathy Dougherty   DOB:  March 22, 1951   MRN:  [id number]   Chief Complaint: Back Pain (X1 week, lower back, hurts when walking,standing, or sitting for a long period of time, pain feels like its in muscle)  Back Pain This is a new problem. The current episode started 1 to 4 weeks ago. The problem occurs constantly. The problem is unchanged. The pain is present in the lumbar spine. The quality of the pain is described as aching. The pain does not radiate. The pain is mild. The pain is worse during the day. Pertinent negatives include no abdominal pain, chest pain, fever, numbness or weakness. Risk factors: started after trip to [facility] and touring the parks. She has tried NSAIDs for the symptoms. The treatment provided mild relief.    Lab Results  Component Value Date   CREATININE 0.76 12/08/2019   BUN 14 12/08/2019   NA 140 12/08/2019   K 3.6 12/08/2019   CL 99 12/08/2019   CO2 25 12/08/2019   Lab Results  Component Value Date   CHOL 105 04/18/2019   HDL 37 (L) 04/18/2019   LDLCALC 55 04/18/2019   TRIG 60 04/18/2019   CHOLHDL 2.8 04/18/2019   Lab Results  Component Value Date   TSH 1.680 04/18/2019   Lab Results  Component Value Date   HGBA1C 8.7 (H) 12/08/2019   Lab Results  Component Value Date   WBC 5.1 04/18/2019   HGB 12.4 04/18/2019   HCT 38.0 04/18/2019   MCV 86 04/18/2019   PLT 208 04/18/2019   Lab Results  Component Value Date   ALT 16 04/18/2019   AST 23 04/18/2019   ALKPHOS 85 04/18/2019   BILITOT 0.4 04/18/2019     Review of Systems  Constitutional: Negative for chills, fatigue and fever.  Respiratory: Negative for chest tightness and shortness of breath.   Cardiovascular: Negative for chest pain.  Gastrointestinal: Negative for abdominal pain.  Musculoskeletal: Positive for back pain. Negative for arthralgias, joint swelling and myalgias.  Neurological: Negative for tremors, weakness and numbness.  Psychiatric/Behavioral:  Positive for sleep disturbance.    Patient Active Problem List   Diagnosis Date Noted  . 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 Hyperlipidemia associated with type 2 diabetes mellitus ([place]) 03/26/2017  . 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 Pre-ulcerative calluses 03/26/2017  . Arthritis of right knee 01/15/2016  . DM (diabetes mellitus), type 2 with complications ([place]) 57/84/6962  . Abnormal LFTs 03/15/2015  . Essential (primary) hypertension 03/15/2015  . Gastro-esophageal reflux disease without esophagitis 03/15/2015  . Allergic rhinitis, seasonal 03/15/2015    Allergies  Allergen Reactions  . Ace Inhibitors Cough  . Metformin And Related Diarrhea  . Penicillins Rash  . Sulfa Antibiotics Rash    Past Surgical History:  Procedure Laterality Date  . CATARACT EXTRACTION Right 08/2014  . CATARACT EXTRACTION Left 04/2016  . HAMMER TOE SURGERY    . PARTIAL HYSTERECTOMY     fibroids    Social History   Tobacco Use  . Smoking status: Never Smoker  . Smokeless tobacco: Never Used  Vaping Use  . Vaping Use: Never used  Substance Use Topics  . Alcohol use: No    Alcohol/week: 0.0 standard drinks  . Drug use: No     Medication list has been reviewed and updated.  Current Meds  Medication Sig  . acetaminophen (TYLENOL) 500 MG tablet Take 500 mg by mouth every 6 (  six) hours as needed.  [name] amLODipine (NORVASC) 2.5 MG tablet Take 1 tablet (2.5 mg total) by mouth daily.  [name] glimepiride (AMARYL) 4 MG tablet Take 1 tablet (4 mg total) by mouth daily before breakfast.  . glucose blood (ONE TOUCH ULTRA TEST) test strip E11.9  . hydrochlorothiazide (HYDRODIURIL) 25 MG tablet TAKE 1 TABLET BY MOUTH EVERY DAY  . ibuprofen (ADVIL,MOTRIN) 600 MG tablet TAKE 1 TABLET (600 MG TOTAL) BY MOUTH EVERY 8 (EIGHT) HOURS AS NEEDED.  [name] Lancets (ONETOUCH ULTRASOFT) lancets Use as instructed  . levocetirizine (XYZAL) 5 MG tablet TAKE 1 TABLET BY MOUTH EVERY DAY IN THE EVENING  . losartan  (COZAAR) 50 MG tablet TAKE 1 TABLET BY MOUTH EVERY DAY  . mometasone (NASONEX) 50 MCG/ACT nasal spray Place 2 sprays into the nose daily.  [name] omeprazole (PRILOSEC) 40 MG capsule TAKE 1 CAPSULE BY MOUTH EVERY DAY  . simvastatin (ZOCOR) 10 MG tablet TAKE 1 TABLET BY MOUTH EVERYDAY AT BEDTIME    PHQ 2/9 Scores 12/08/2019 11/17/2019 08/05/2019 07/11/2019  PHQ - 2 Score 0 0 0 0  PHQ- 9 Score 0 4 6 4     GAD 7 : Generalized Anxiety Score 12/08/2019 11/17/2019  Nervous, Anxious, on Edge 0 0  Control/stop worrying 0 0  Worry too much - different things 0 0  Trouble relaxing 1 0  Restless 0 0  Easily annoyed or irritable 0 0  Afraid - awful might happen 0 0  Total GAD 7 Score 1 0  Anxiety Difficulty Not difficult at all Not difficult at all    BP Readings from Last 3 Encounters:  01/10/20 124/86  12/08/19 132/82  11/17/19 132/76    Physical Exam Vitals and nursing note reviewed.  Constitutional:      General: She is not in acute distress.    Appearance: She is well-developed.  HENT:     Head: Normocephalic and atraumatic.  Cardiovascular:     Rate and Rhythm: Normal rate and regular rhythm.     Pulses: Normal pulses.  Pulmonary:     Effort: Pulmonary effort is normal. No respiratory distress.     Breath sounds: No wheezing or rhonchi.  Musculoskeletal:     Lumbar back: Spasms and tenderness present. Negative right straight leg raise test and negative left straight leg raise test.     Right lower leg: No edema.     Left lower leg: No edema.  Skin:    General: Skin is warm and dry.     Findings: No rash.  Neurological:     Mental Status: She is alert and oriented to person, place, and time.  Psychiatric:        Behavior: Behavior normal.        Thought Content: Thought content normal.     Wt Readings from Last 3 Encounters:  01/10/20 176 lb (79.8 kg)  12/08/19 176 lb (79.8 kg)  11/17/19 181 lb (82.1 kg)    BP 124/86   Pulse 76   Temp 98.2 F (36.8 C) (Oral)   Ht 5\' 2"   (1.575 m)   Wt 176 lb (79.8 kg)   SpO2 100%   BMI 32.19 kg/m   Assessment and Plan: 1. Back muscle spasm No alarm signs or evidence of HNP Continue Advil three times a day Add heat and robaxin at bedtime - methocarbamol (ROBAXIN) 500 MG tablet; Take 1 tablet (500 mg total) by mouth at bedtime.  Dispense: 30 tablet; Refill: 0   Partially dictated  using Editor, commissioning. Any errors are unintentional.  [name], MD [facility]  01/10/2020

## 2020-01-30 DIAGNOSIS — Z20822 Contact with and (suspected) exposure to covid-19: Secondary | ICD-10-CM | POA: Diagnosis not present

## 2020-02-15 ENCOUNTER — Telehealth: Payer: Self-pay | Admitting: *Deleted

## 2020-02-15 NOTE — Chronic Care Management (AMB) (Signed)
  Chronic Care Management   Outreach Note  02/15/2020 Name: Kathy Dougherty MRN: [id number] DOB: 1950-08-21  Kathy Dougherty is a 69 y.o. year old female who is a primary care patient of [name] Jesse Sans, MD. I reached out to Kathy Dougherty by phone today in response to a referral sent by Kathy Dougherty's health plan.     An unsuccessful telephone outreach was attempted today. The patient was referred to the case management team for assistance with care management and care coordination.   Follow Up Plan: The care management team will reach out to the patient again over the next 7 days. If patient returns call to provider office, please advise to call [place] at [phone].  [facility]

## 2020-02-20 NOTE — Chronic Care Management (AMB) (Signed)
  Chronic Care Management   Note  02/20/2020 Name: Kathy Dougherty MRN: [id number] DOB: February 06, 1951  Kathy Dougherty is a 69 y.o. year old female who is a primary care patient of [name], MD. I reached out to [name] by phone today in response to a referral sent by Ms. Nahia Zender's health plan.     Ms. Chancellor was given information about Chronic Care Management services today including:  1. CCM service includes personalized support from designated clinical staff supervised by her physician, including individualized plan of care and coordination with other care providers 2. 24/7 contact phone numbers for assistance for urgent and routine care needs. 3. Service will only be billed when office clinical staff spend 20 minutes or more in a month to coordinate care. 4. Only one practitioner may furnish and bill the service in a calendar month. 5. The patient may stop CCM services at any time (effective at the end of the month) by phone call to the office staff. 6. The patient will be responsible for cost sharing (co-pay) of up to 20% of the service fee (after annual deductible is met).  Patient agreed to services and verbal consent obtained.   Follow up plan: Face to Face appointment with care management team member scheduled for:  03/06/2020  [facility]  Direct Dial: [phone]

## 2020-03-06 ENCOUNTER — Telehealth: Payer: Medicare HMO

## 2020-03-09 ENCOUNTER — Other Ambulatory Visit: Payer: Self-pay | Admitting: Internal Medicine

## 2020-03-09 DIAGNOSIS — I10 Essential (primary) hypertension: Secondary | ICD-10-CM

## 2020-03-12 NOTE — Chronic Care Management (AMB) (Signed)
Chronic Care Management Pharmacy  Name: Kathy Dougherty  MRN: [id number] DOB: 1951/04/28  Chief Complaint/ HPI  Kathy Dougherty,  69 y.o. , female presents for their Initial CCM visit with the clinical pharmacist In office.  PCP : [name], MD  Their chronic conditions include: Hypertension, Hyperlipidemia, Diabetes, GERD, Osteoarthritis and Allergic Rhinitis  Office Visits: 01/10/20- Dr. [name] -  back muscle spasms-methocarbamol 500 mg qhs, Advil tid 12/08/19- Dr. [name] - A1c 8.7, labs, podiatry for toenail fungus, add mucinex for allergic rhinitis 08/05/19- Dr. [name] - mometasone nasal spray, noncompliance    Medications: Outpatient Encounter Medications as of 03/13/2020  Medication Sig  . acetaminophen (TYLENOL) 500 MG tablet Take 500 mg by mouth every 6 (six) hours as needed.  [name] amLODipine (NORVASC) 2.5 MG tablet Take 1 tablet (2.5 mg total) by mouth daily.  [name] docusate sodium (COLACE) 100 MG capsule Take 100 mg by mouth 2 (two) times daily.  [name] glimepiride (AMARYL) 4 MG tablet Take 1 tablet (4 mg total) by mouth daily before breakfast.  . glucose blood (ONE TOUCH ULTRA TEST) test strip E11.9  . hydrochlorothiazide (HYDRODIURIL) 25 MG tablet TAKE 1 TABLET BY MOUTH EVERY DAY  . Lancets (ONETOUCH ULTRASOFT) lancets Use as instructed  . levocetirizine (XYZAL) 5 MG tablet TAKE 1 TABLET BY MOUTH EVERY DAY IN THE EVENING  . losartan (COZAAR) 50 MG tablet TAKE 1 TABLET BY MOUTH EVERY DAY  . Magnesium 400 MG TABS Take 400 mg by mouth daily.  . mometasone (NASONEX) 50 MCG/ACT nasal spray Place 2 sprays into the nose daily.  . Multiple Vitamins-Minerals (HM MULTIVITAMIN ADULT GUMMY PO) Take 2 each by mouth daily.  . simvastatin (ZOCOR) 10 MG tablet TAKE 1 TABLET BY MOUTH EVERYDAY AT BEDTIME  . VITAMIN D, CHOLECALCIFEROL, PO Take 500 Units by mouth daily.   [name] ibuprofen (ADVIL,MOTRIN) 600 MG tablet TAKE 1 TABLET (600 MG TOTAL) BY MOUTH EVERY 8 (EIGHT) HOURS AS NEEDED.  (Patient not taking: Reported on 03/13/2020)  . methocarbamol (ROBAXIN) 500 MG tablet Take 1 tablet (500 mg total) by mouth at bedtime. (Patient not taking: Reported on 03/13/2020)  . omeprazole (PRILOSEC) 40 MG capsule TAKE 1 CAPSULE BY MOUTH EVERY DAY (Patient not taking: Reported on 03/13/2020)   No facility-administered encounter medications on file as of 03/13/2020.    Current Diagnosis/Assessment:    Goals Addressed            This Visit's Progress   . Pharmacy care plan       CARE PLAN ENTRY (see longitudinal plan of care for additional care plan information)  Current Barriers:  . Chronic Disease Management support, education, and care coordination needs related to Hypertension, Hyperlipidemia, Diabetes, GERD, Osteoarthritis, and Allergic Rhinitis   Hypertension BP Readings from Last 3 Encounters:  01/10/20 124/86  12/08/19 132/82  11/17/19 132/76   . Pharmacist Clinical Goal(s): o Over the next 60 days, patient will work with PharmD and providers to maintain BP goal <130/80 . Current regimen:  . Amlodipine 2.5 mg qd . HCTZ 25 mg qd . Losartan 50 mg qd . Interventions: .  Counseled on proper BP technique   . Will collaborate with PCP to send Rx for new glucometer. [name] follow-up with rep for CGM coverage/cost and notify patint. . Patient self care activities - Over the next 60 days, patient will: o Check BP 2-3 times weekly document, and provide at future appointments o Ensure daily salt intake < 2300 mg/day  Hyperlipidemia Lab  Results  Component Value Date/Time   LDLCALC 55 04/18/2019 09:18 AM   . Pharmacist Clinical Goal(s): o Over the next 60 days, patient will work with PharmD and providers to maintain LDL goal < 70 . Current regimen:  o Simvastatin 10 mg qd . Interventions: o Provided diet and exercise counseling. . Patient self care activities - Over the next 60 days, patient will: o Increase exercise to goal of 30 min/cay 5  days/week   Diabetes Lab Results  Component Value Date/Time   HGBA1C 8.7 (H) 12/08/2019 08:55 AM   HGBA1C 9.2 (H) 08/05/2019 09:17 AM   . Pharmacist Clinical Goal(s): o Over the next 60 days, patient will work with PharmD and providers to achieve A1c goal <7% . Current regimen:  o Glimepiride 4 mg ac breakfast . Interventions:  o Reviewed goal BG readings for an A1c of <7%, we want to see fasting sugars <130 and 2 hour after meal sugars <180.  o Provided diet and exercise counseling. . Patient self care activities - Over the next 60 days, patient will: o Check blood sugar twice daily, document, and provide at future appointments o Contact provider with any episodes of hypoglycemia  Medication management . Pharmacist Clinical Goal(s): o Over the next 60 days, patient will work with PharmD and providers to achieve optimal medication adherence . Current [facility]: [facility] . Interventions o Comprehensive medication review performed. o Continue current medication management strategy . Patient self care activities - Over the next 60 days, patient will: o Focus on medication adherence by fill dates o Take medications as prescribed o Report any questions or concerns to PharmD and/or provider(s)  Initial goal documentation         Diabetes   A1c goal <7%  Recent Relevant Labs: Lab Results  Component Value Date/Time   HGBA1C 8.7 (H) 12/08/2019 08:55 AM   HGBA1C 9.2 (H) 08/05/2019 09:17 AM    Last diabetic Eye exam:  Lab Results  Component Value Date/Time   HMDIABEYEEXA No Retinopathy 11/11/2019 12:00 AM    BMP Latest Ref Rng & Units 12/08/2019 04/18/2019 02/28/2019  Glucose 65 - 99 mg/dL 198(H) 167(H) 106(H)  BUN 8 - 27 mg/dL _0 Creatinine 0.57 - 1.00 mg/dL 0.76 0.80 0.64  BUN/Creat Ratio 12 - _1 Sodium 134 - 144 mmol/L 140 139 142  Potassium 3.5 - 5.2 mmol/L 3.6 3.8 4.1  Chloride 96 - 106 mmol/L 99 100 104  CO2 20 - 29 mmol/L _2 Calcium 8.7 -  10.3 mg/dL 9.7 9.1 9.9    Last diabetic Foot exam: No results found for: HMDIABFOOTEX   Checking BG: Not checking  Patient has failed these meds in past:metfomin --GI effects Patient is currently uncontrolled on the following medications: . Glimepiride 4 mg ac breakfast  We discussed: Patient has not been checking BG since last office visit as her lancing device is malfunctioning. Will collaborate with PCP to have [facility]. Patient believes she's had the same meter since 20217.  She reports not liking to stick her finger and having pain afterward for a couple of days. She expressed interest in a CMG. Will follow-up with [employer] rep to determine if patient is eligible without injectable therapy. We reviewed signs and symptoms of hypoglycemia, patient denies. We discussed goal glucose readings for an A1c of <7%, we want to see fasting sugars <130 and 2 hour after meal sugars <180.  Discussed need for  a second agent to get to goal BG and avoid hypoglycemia often associated with glimepiride. Patient refilled 03/04/20 still had ~30 tabs remaining in bottle from 12/08/19. Denies missing doses frequently.    Plan  Continue current medications recheck A1C in 90 days. Recommend consider adding SGLT-2 if elevation continues.  Hypertension   BP goal is:  <130/80  Office blood pressures are  BP Readings from Last 3 Encounters:  01/10/20 124/86  12/08/19 132/82  11/17/19 132/76   BMP Latest Ref Rng & Units 12/08/2019 04/18/2019 02/28/2019  Glucose 65 - 99 mg/dL 198(H) 167(H) 106(H)  BUN 8 - 27 mg/dL _0 Creatinine 0.57 - 1.00 mg/dL 0.76 0.80 0.64  BUN/Creat Ratio 12 - _1 Sodium 134 - 144 mmol/L 140 139 142  Potassium 3.5 - 5.2 mmol/L 3.6 3.8 4.1  Chloride 96 - 106 mmol/L 99 100 104  CO2 20 - 29 mmol/L _2 Calcium 8.7 - 10.3 mg/dL 9.7 9.1 9.9    Patient checks BP at home Randomly Patient home BP readings are ranging: "Normal"   Patient has failed  these meds in the past: NA Patient is currently controlled on the following medications:  . Amlodipine 2.5 mg qd . HCTZ 25 mg qd . Losartan 50 mg qd  We discussed diet and exercise extensively. Patient questions why she is on three medications for BP control. Counseled on differing MOA's of the medications and that multiple medications are often required for control. Discussed low-sodium food choices and exercise help achieve control.  Encouraged patient to check BP 1-2 times weekly.  Plan  Continue current medications     Hyperlipidemia   LDL goal < 70  Lipid Panel     Component Value Date/Time   CHOL 105 04/18/2019 0918   TRIG 60 04/18/2019 0918   HDL 37 (L) 04/18/2019 0918   LDLCALC 55 04/18/2019 0918    Hepatic Function Latest Ref Rng & Units 04/18/2019 08/11/2018 04/09/2018  Total Protein 6.0 - 8.5 g/dL 7.3 7.2 7.2  Albumin 3.8 - 4.8 g/dL 4.3 4.2 4.3  AST 0 - 40 IU/L _3 ALT 0 - 32 IU/L _4 Alk Phosphatase 39 - 117 IU/L 85 83 80  Total Bilirubin 0.0 - 1.2 mg/dL 0.4 0.6 0.5     The ASCVD Risk score ([place] et al., 2013) failed to calculate for the following reasons:   The valid total cholesterol range is 130 to 320 mg/dL   Patient has failed these meds in past: NA Patient is currently controlled on the following medications:  . Simvastatin 10 mg qd  We discussed: Diet and exercise. Reviewed goal cholesterol levels.    Plan  Continue current medications  Osteopenia / Osteoporosis   Last DEXA Scan: 08/18/2017  T-Score femoral neck: -1.1  T-Score forearm radius: -1.6  10-year probability of major osteoporotic fracture: 3.5%  10-year probability of hip fracture: 0.3%  No results found for: VD25OH   Patient is not a candidate for pharmacologic treatment  Patient has failed these meds in past: NA Patient is currently controlled on the following medication . Vitamin D 500 units daily  We discussed:  Recommend 1200 mg of calcium daily from  dietary and supplemental sources. Recommend weight-bearing and muscle strengthening exercises for building and maintaining bone density.   Plan  Continue current medications. Recommend consider repeat DEXA scan and follow-up vitamin d level. Consider initiation of calcium supplementation.  Allergic  Rhinitis   Patient has failed these meds in past: NA Patient is currently controlled on the following medications:  . Levocetirizine 5 mg qd . Mometasone nasal 2 sprays daily  We discussed:  Patient reports allergies pretty well unless she gets around triggers such as cats.  Plan  Continue current medications   GERD   Patient has failed these meds in past: NA Patient is currently controlled on the following medications:  . Omeprazole 40 mg qd  (taking prn)  We discussed:  Patient does not take every day and has only been taking when she has symptoms. Reports recently taking Alka-seltzer for immediate relief. I counseled on the high sodium content of Alka-Seltzer and recommend using Tums or Rolaids instead.  Plan  Continue current medications. Consider discontinuation as patient has taken in several weeks per report.    Vaccines   Reviewed and discussed patient's vaccination history.    Immunization History  Administered Date(s) Administered  . Fluad Quad(high Dose 65+) 02/18/2019, 03/13/2020  . Influenza, High Dose Seasonal PF 03/23/2017, 03/22/2018  . Influenza,inj,Quad PF,6+ Mos 03/19/2015, 03/20/2016  . Moderna SARS-COVID-2 Vaccination 06/24/2019, 07/26/2019  . Pneumococcal Conjugate-13 03/20/2016  . Pneumococcal Polysaccharide-23 03/23/2017  . Zoster 11/05/2012    Plan Patient received flu shot in office today.  Recommend Shingrix vaccine if insurance will cover.   Osteoarthritis   Patient has failed these meds in past:  Patient is currently controlled on the following medications:   Acetaminophen 549m q6h prn We discussed:  Patient only takes once daily  normally.   Plan  Continue current medications.  Health Maintenance   Patient is currently on the following:  .Marland KitchenMagnesium 400 mg daily  . Vitamin D 500 units daily . Multivitamin Gummy daily  We discussed:  Counseled on sugar content of Gummy vitamins. Patient reports self initiation of magnesium for leg cramping at night. Reports improvement in symptoms. Patient is satisfied with this regimen.  Plan  Continue current medications. Consider vitamin d and magnesium levels with next lab draws.  Medication Management   [facility] Uses pill box? Yes Pt endorses 90 % compliance  We discussed: Discussed benefits of medication synchronization, packaging and delivery as well as enhanced pharmacist oversight with [employer].  Plan  Continue current medication management strategy. Patient enjoys getting out and going to pharmacy.    Follow up: 2 month phone visit  JJunita Push HKenton KingfisherPharmD, [phone]

## 2020-03-13 ENCOUNTER — Ambulatory Visit (INDEPENDENT_AMBULATORY_CARE_PROVIDER_SITE_OTHER): Payer: Medicare HMO

## 2020-03-13 ENCOUNTER — Ambulatory Visit: Payer: Medicare HMO | Admitting: Pharmacist

## 2020-03-13 ENCOUNTER — Other Ambulatory Visit: Payer: Self-pay

## 2020-03-13 DIAGNOSIS — Z23 Encounter for immunization: Secondary | ICD-10-CM

## 2020-03-13 DIAGNOSIS — E118 Type 2 diabetes mellitus with unspecified complications: Secondary | ICD-10-CM

## 2020-03-13 DIAGNOSIS — I10 Essential (primary) hypertension: Secondary | ICD-10-CM

## 2020-03-13 NOTE — Patient Instructions (Addendum)
Visit Information  It was a pleasure speaking with you today! Thank you for letting me be a part of your care team. Please call with any questions or concerns.  Goals Addressed            This Visit's Progress   . Pharmacy care plan       CARE PLAN ENTRY (see longitudinal plan of care for additional care plan information)  Current Barriers:  . Chronic Disease Management support, education, and care coordination needs related to Hypertension, Hyperlipidemia, Diabetes, GERD, Osteoarthritis, and Allergic Rhinitis   Hypertension BP Readings from Last 3 Encounters:  01/10/20 124/86  12/08/19 132/82  11/17/19 132/76   . Pharmacist Clinical Goal(s): o Over the next 60 days, patient will work with PharmD and providers to maintain BP goal <130/80 . Current regimen:  . Amlodipine 2.5 mg qd . HCTZ 25 mg qd . Losartan 50 mg qd . Interventions: . I recommend arm cuffs over wrist cuffs for accuracy. Check your blood pressure ~3-4 times weekly. Make sure you are sitting down for at least 5 minutes before, resting calmly, with your feet flat on the floor.   . Patient self care activities - Over the next 60 days, patient will: o Check BP 2-3 times weekly document, and provide at future appointments o Ensure daily salt intake < 2300 mg/day  Hyperlipidemia Lab Results  Component Value Date/Time   LDLCALC 55 04/18/2019 09:18 AM   . Pharmacist Clinical Goal(s): o Over the next 60 days, patient will work with PharmD and providers to maintain LDL goal < 70 . Current regimen:  o Simvastatin 10 mg qd . Interventions: o Provided diet and exercise counseling. . Patient self care activities - Over the next 60 days, patient will: o Increase exercise to goal of 30 min/cay 5 days/week   Diabetes Lab Results  Component Value Date/Time   HGBA1C 8.7 (H) 12/08/2019 08:55 AM   HGBA1C 9.2 (H) 08/05/2019 09:17 AM   . Pharmacist Clinical Goal(s): o Over the next 60 days, patient will work with  PharmD and providers to achieve A1c goal <7% . Current regimen:  o Glimepiride 4 mg ac breakfast . Interventions:  o Reviewed goal BG readings for an A1c of <7%, we want to see fasting sugars <130 and 2 hour after meal sugars <180.  o Provided diet and exercise counseling. . Patient self care activities - Over the next 60 days, patient will: o Check blood sugar twice daily, document, and provide at future appointments o Contact provider with any episodes of hypoglycemia  Medication management . Pharmacist Clinical Goal(s): o Over the next 60 days, patient will work with PharmD and providers to achieve optimal medication adherence . Current pharmacy: [facility] . Interventions o Comprehensive medication review performed. o Continue current medication management strategy . Patient self care activities - Over the next 60 days, patient will: o Focus on medication adherence by fill dates o Take medications as prescribed o Report any questions or concerns to PharmD and/or provider(s)  Initial goal documentation        Kathy Dougherty was given information about Chronic Care Management services today including:  1. CCM service includes personalized support from designated clinical staff supervised by her physician, including individualized plan of care and coordination with other care providers 2. 24/7 contact phone numbers for assistance for urgent and routine care needs. 3. Standard insurance, coinsurance, copays and deductibles apply for chronic care management only during months in which we provide at least  20 minutes of these services. Most insurances cover these services at 100%, however patients may be responsible for any copay, coinsurance and/or deductible if applicable. This service may help you avoid the need for more expensive face-to-face services. 4. Only one practitioner may furnish and bill the service in a calendar month. 5. The patient may stop CCM services at any time (effective at  the end of the month) by phone call to the office staff.  Patient agreed to services and verbal consent obtained.   The patient verbalized understanding of instructions provided today and agreed to receive a mailed copy of patient instruction and/or educational materials. Telephone follow up appointment with pharmacy team member scheduled for: 2 months  Kathy Dougherty. Taggert Bozzi PharmD, BCPS Clinical Pharmacist [phone]  Diabetes Mellitus and Nutrition, Adult When you have diabetes (diabetes mellitus), it is very important to have healthy eating habits because your blood sugar (glucose) levels are greatly affected by what you eat and drink. Eating healthy foods in the appropriate amounts, at about the same times every day, can help you:  Control your blood glucose.  Lower your risk of heart disease.  Improve your blood pressure.  Reach or maintain a healthy weight. Every person with diabetes is different, and each person has different needs for a meal plan. Your health care provider may recommend that you work with a diet and nutrition specialist (dietitian) to make a meal plan that is best for you. Your meal plan may vary depending on factors such as:  The calories you need.  The medicines you take.  Your weight.  Your blood glucose, blood pressure, and cholesterol levels.  Your activity level.  Other health conditions you have, such as heart or kidney disease. How do carbohydrates affect me? Carbohydrates, also called carbs, affect your blood glucose level more than any other type of food. Eating carbs naturally raises the amount of glucose in your blood. Carb counting is a method for keeping track of how many carbs you eat. Counting carbs is important to keep your blood glucose at a healthy level, especially if you use insulin or take certain oral diabetes medicines. It is important to know how many carbs you can safely have in each meal. This is different for every person. Your  dietitian can help you calculate how many carbs you should have at each meal and for each snack. Foods that contain carbs include:  Bread, cereal, rice, pasta, and crackers.  Potatoes and corn.  Peas, beans, and lentils.  Milk and yogurt.  Fruit and juice.  Desserts, such as cakes, cookies, ice cream, and candy. How does alcohol affect me? Alcohol can cause a sudden decrease in blood glucose (hypoglycemia), especially if you use insulin or take certain oral diabetes medicines. Hypoglycemia can be a life-threatening condition. Symptoms of hypoglycemia (sleepiness, dizziness, and confusion) are similar to symptoms of having too much alcohol. If your health care provider says that alcohol is safe for you, follow these guidelines:  Limit alcohol intake to no more than 1 drink per day for nonpregnant women and 2 drinks per day for men. One drink equals 12 oz of beer, 5 oz of wine, or 1 oz of hard liquor.  Do not drink on an empty stomach.  Keep yourself hydrated with water, diet soda, or unsweetened iced tea.  Keep in mind that regular soda, juice, and other mixers may contain a lot of sugar and must be counted as carbs. What are tips for following this plan?  Reading food labels  Start by checking the serving size on the "Nutrition Facts" label of packaged foods and drinks. The amount of calories, carbs, fats, and other nutrients listed on the label is based on one serving of the item. Many items contain more than one serving per package.  Check the total grams (g) of carbs in one serving. You can calculate the number of servings of carbs in one serving by dividing the total carbs by 15. For example, if a food has 30 g of total carbs, it would be equal to 2 servings of carbs.  Check the number of grams (g) of saturated and trans fats in one serving. Choose foods that have low or no amount of these fats.  Check the number of milligrams (mg) of salt (sodium) in one serving. Most people  should limit total sodium intake to less than 2,300 mg per day.  Always check the nutrition information of foods labeled as "low-fat" or "nonfat". These foods may be higher in added sugar or refined carbs and should be avoided.  Talk to your dietitian to identify your daily goals for nutrients listed on the label. Shopping  Avoid buying canned, premade, or processed foods. These foods tend to be high in fat, sodium, and added sugar.  Shop around the outside edge of the grocery store. This includes fresh fruits and vegetables, bulk grains, fresh meats, and fresh dairy. Cooking  Use low-heat cooking methods, such as baking, instead of high-heat cooking methods like deep frying.  Cook using healthy oils, such as olive, canola, or sunflower oil.  Avoid cooking with butter, cream, or high-fat meats. Meal planning  Eat meals and snacks regularly, preferably at the same times every day. Avoid going long periods of time without eating.  Eat foods high in fiber, such as fresh fruits, vegetables, beans, and whole grains. Talk to your dietitian about how many servings of carbs you can eat at each meal.  Eat 4-6 ounces (oz) of lean protein each day, such as lean meat, chicken, fish, eggs, or tofu. One oz of lean protein is equal to: ? 1 oz of meat, chicken, or fish. ? 1 egg. ?  cup of tofu.  Eat some foods each day that contain healthy fats, such as avocado, nuts, seeds, and fish. Lifestyle  Check your blood glucose regularly.  Exercise regularly as told by your health care provider. This may include: ? 150 minutes of moderate-intensity or vigorous-intensity exercise each week. This could be brisk walking, biking, or water aerobics. ? Stretching and doing strength exercises, such as yoga or weightlifting, at least 2 times a week.  Take medicines as told by your health care provider.  Do not use any products that contain nicotine or tobacco, such as cigarettes and e-cigarettes. If you need  help quitting, ask your health care provider.  Work with a Social worker or diabetes educator to identify strategies to manage stress and any emotional and social challenges. Questions to ask a health care provider  Do I need to meet with a diabetes educator?  Do I need to meet with a dietitian?  What number can I call if I have questions?  When are the best times to check my blood glucose? Where to find more information:  American Diabetes Association: diabetes.org  Academy of Nutrition and Dietetics: www.eatright.[employer] of Diabetes and Digestive and Kidney Diseases (NIH): [employer] Summary  A healthy meal plan will help you control your blood glucose and maintain a healthy  lifestyle.  Working with a diet and nutrition specialist (dietitian) can help you make a meal plan that is best for you.  Keep in mind that carbohydrates (carbs) and alcohol have immediate effects on your blood glucose levels. It is important to count carbs and to use alcohol carefully. This information is not intended to replace advice given to you by your health care provider. Make sure you discuss any questions you have with your health care provider. Document Revised: 04/17/2017 Document Reviewed: 06/09/2016 Elsevier Patient Education  2020 [employer].

## 2020-04-05 ENCOUNTER — Other Ambulatory Visit: Payer: Self-pay | Admitting: Internal Medicine

## 2020-04-05 DIAGNOSIS — I10 Essential (primary) hypertension: Secondary | ICD-10-CM

## 2020-04-18 ENCOUNTER — Other Ambulatory Visit: Payer: Self-pay

## 2020-04-18 ENCOUNTER — Ambulatory Visit (INDEPENDENT_AMBULATORY_CARE_PROVIDER_SITE_OTHER): Payer: Medicare HMO | Admitting: Internal Medicine

## 2020-04-18 ENCOUNTER — Encounter: Payer: Self-pay | Admitting: Internal Medicine

## 2020-04-18 VITALS — BP 123/68 | HR 75 | Ht 62.0 in | Wt 172.0 lb

## 2020-04-18 DIAGNOSIS — I10 Essential (primary) hypertension: Secondary | ICD-10-CM | POA: Diagnosis not present

## 2020-04-18 DIAGNOSIS — K219 Gastro-esophageal reflux disease without esophagitis: Secondary | ICD-10-CM

## 2020-04-18 DIAGNOSIS — R222 Localized swelling, mass and lump, trunk: Secondary | ICD-10-CM

## 2020-04-18 DIAGNOSIS — Z1231 Encounter for screening mammogram for malignant neoplasm of breast: Secondary | ICD-10-CM

## 2020-04-18 DIAGNOSIS — H9191 Unspecified hearing loss, right ear: Secondary | ICD-10-CM

## 2020-04-18 DIAGNOSIS — E1169 Type 2 diabetes mellitus with other specified complication: Secondary | ICD-10-CM | POA: Diagnosis not present

## 2020-04-18 DIAGNOSIS — R69 Illness, unspecified: Secondary | ICD-10-CM | POA: Diagnosis not present

## 2020-04-18 DIAGNOSIS — E118 Type 2 diabetes mellitus with unspecified complications: Secondary | ICD-10-CM

## 2020-04-18 DIAGNOSIS — E785 Hyperlipidemia, unspecified: Secondary | ICD-10-CM | POA: Diagnosis not present

## 2020-04-18 DIAGNOSIS — Z0000 Encounter for general adult medical examination without abnormal findings: Secondary | ICD-10-CM | POA: Diagnosis not present

## 2020-04-18 DIAGNOSIS — D171 Benign lipomatous neoplasm of skin and subcutaneous tissue of trunk: Secondary | ICD-10-CM | POA: Insufficient documentation

## 2020-04-18 LAB — POCT URINALYSIS DIPSTICK
Bilirubin, UA: NEGATIVE
Blood, UA: NEGATIVE
Glucose, UA: NEGATIVE
Ketones, UA: NEGATIVE
Leukocytes, UA: NEGATIVE
Nitrite, UA: NEGATIVE
Protein, UA: NEGATIVE
Spec Grav, UA: 1.01 (ref 1.010–1.025)
Urobilinogen, UA: 0.2 E.U./dL
pH, UA: 6 (ref 5.0–8.0)

## 2020-04-18 MED ORDER — BLOOD GLUCOSE MONITOR KIT: PACK | 0 refills | Status: AC

## 2020-04-18 MED ORDER — HYDROCHLOROTHIAZIDE 25 MG PO TABS: 25.0000 mg | ORAL_TABLET | Freq: Every day | ORAL | 3 refills | Status: DC

## 2020-04-18 NOTE — Progress Notes (Signed)
Date:  04/18/2020   Name:  Kathy Dougherty   DOB:  02-Oct-1950   MRN:  [id number]   Chief Complaint: Annual Exam (breast exam no pap)  Kathy Dougherty is a 69 y.o. female who presents today for her Complete Annual Exam. She feels well. She reports exercising walking x3-4 days a week. She reports she is sleeping poorly. Breast complaints none.  Mammogram: 04/2019 scheduled at [facility] DEXA: 08/2017 osteopenia Pap smear: discontinued Colonoscopy: 05/2012 repeat 2024  Immunization History  Administered Date(s) Administered  . Fluad Quad(high Dose 65+) 02/18/2019, 03/13/2020  . Influenza, High Dose Seasonal PF 03/23/2017, 03/22/2018  . Influenza,inj,Quad PF,6+ Mos 03/19/2015, 03/20/2016  . Moderna SARS-COVID-2 Vaccination 06/24/2019, 07/26/2019  . Pneumococcal Conjugate-13 03/20/2016  . Pneumococcal Polysaccharide-23 03/23/2017  . Zoster 11/05/2012    Hypertension This is a chronic problem. The problem is unchanged. The problem is controlled (127/80). Pertinent negatives include no blurred vision, chest pain, headaches, palpitations or shortness of breath. Past treatments include angiotensin blockers, calcium channel blockers and diuretics. The current treatment provides significant improvement. There is no history of kidney disease, CAD/MI or CVA.  Diabetes She presents for her follow-up diabetic visit. She has type 2 diabetes mellitus. Her disease course has been improving. Pertinent negatives for hypoglycemia include no dizziness, headaches, nervousness/anxiousness or tremors. Pertinent negatives for diabetes include no blurred vision, no chest pain, no fatigue, no foot paresthesias, no polydipsia, no polyuria and no weight loss. Symptoms are stable. Pertinent negatives for diabetic complications include no CVA. Current diabetic treatment includes oral agent (monotherapy). She is compliant with treatment all of the time. Her home blood glucose trend is decreasing steadily. An ACE  inhibitor/angiotensin II receptor blocker is being taken. Eye exam is current.  Hyperlipidemia The problem is controlled. Pertinent negatives include no chest pain or shortness of breath. Current antihyperlipidemic treatment includes statins. The current treatment provides significant improvement of lipids.  Gastroesophageal Reflux She complains of heartburn. She reports no abdominal pain, no chest pain, no choking, no coughing, no dysphagia or no wheezing. This is a recurrent problem. The problem occurs rarely. Pertinent negatives include no fatigue or weight loss. She has tried a PPI for the symptoms.    Lab Results  Component Value Date   CREATININE 0.76 12/08/2019   BUN 14 12/08/2019   NA 140 12/08/2019   K 3.6 12/08/2019   CL 99 12/08/2019   CO2 25 12/08/2019   Lab Results  Component Value Date   CHOL 105 04/18/2019   HDL 37 (L) 04/18/2019   LDLCALC 55 04/18/2019   TRIG 60 04/18/2019   CHOLHDL 2.8 04/18/2019   Lab Results  Component Value Date   TSH 1.680 04/18/2019   Lab Results  Component Value Date   HGBA1C 8.7 (H) 12/08/2019   Lab Results  Component Value Date   WBC 5.1 04/18/2019   HGB 12.4 04/18/2019   HCT 38.0 04/18/2019   MCV 86 04/18/2019   PLT 208 04/18/2019   Lab Results  Component Value Date   ALT 16 04/18/2019   AST 23 04/18/2019   ALKPHOS 85 04/18/2019   BILITOT 0.4 04/18/2019     Review of Systems  Constitutional: Negative for chills, fatigue, fever and weight loss.  HENT: Positive for hearing loss (deaf in right ear). Negative for congestion, tinnitus, trouble swallowing and voice change.   Eyes: Negative for blurred vision and visual disturbance.  Respiratory: Negative for cough, choking, chest tightness, shortness of breath and wheezing.   Cardiovascular: Negative  for chest pain, palpitations and leg swelling.  Gastrointestinal: Positive for heartburn. Negative for abdominal pain, constipation, diarrhea, dysphagia and vomiting.    Endocrine: Negative for polydipsia and polyuria.  Genitourinary: Negative for dysuria, frequency, genital sores, vaginal bleeding and vaginal discharge.  Musculoskeletal: Negative for arthralgias, gait problem and joint swelling.  Skin: Negative for color change and rash.  Neurological: Negative for dizziness, tremors, light-headedness and headaches.  Hematological: Negative for adenopathy. Does not bruise/bleed easily.  Psychiatric/Behavioral: Negative for dysphoric mood and sleep disturbance. The patient is not nervous/anxious.     Patient Active Problem List   Diagnosis Date Noted  . 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  . 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 Hyperlipidemia associated with type 2 diabetes mellitus ([place]) 03/26/2017  . 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 Pre-ulcerative calluses 03/26/2017  . Arthritis of right knee 01/15/2016  . DM (diabetes mellitus), type 2 with complications ([place]) 21/19/4174  . Abnormal LFTs 03/15/2015  . Essential (primary) hypertension 03/15/2015  . Gastro-esophageal reflux disease without esophagitis 03/15/2015  . Allergic rhinitis, seasonal 03/15/2015    Allergies  Allergen Reactions  . Ace Inhibitors Cough  . Metformin And Related Diarrhea  . Penicillins Rash  . Sulfa Antibiotics Rash    Past Surgical History:  Procedure Laterality Date  . CATARACT EXTRACTION Right 08/2014  . CATARACT EXTRACTION Left 04/2016  . HAMMER TOE SURGERY    . PARTIAL HYSTERECTOMY     fibroids    Social History   Tobacco Use  . Smoking status: Never Smoker  . Smokeless tobacco: Never Used  Vaping Use  . Vaping Use: Never used  Substance Use Topics  . Alcohol use: No    Alcohol/week: 0.0 standard drinks  . Drug use: No    Medication list has been reviewed and updated.  Current Meds  Medication Sig  . acetaminophen (TYLENOL) 500 MG tablet Take 500 mg by mouth every 6 (six) hours as needed.  [name] amLODipine (NORVASC) 2.5 MG tablet Take 1 tablet  (2.5 mg total) by mouth daily.  [name] docusate sodium (COLACE) 100 MG capsule Take 100 mg by mouth 2 (two) times daily.  [name] glimepiride (AMARYL) 4 MG tablet Take 1 tablet (4 mg total) by mouth daily before breakfast.  . glucose blood (ONE TOUCH ULTRA TEST) test strip E11.9  . hydrochlorothiazide (HYDRODIURIL) 25 MG tablet Take 1 tablet (25 mg total) by mouth daily.  [name] ibuprofen (ADVIL,MOTRIN) 600 MG tablet TAKE 1 TABLET (600 MG TOTAL) BY MOUTH EVERY 8 (EIGHT) HOURS AS NEEDED.  [name] Lancets (ONETOUCH ULTRASOFT) lancets Use as instructed  . levocetirizine (XYZAL) 5 MG tablet TAKE 1 TABLET BY MOUTH EVERY DAY IN THE EVENING  . losartan (COZAAR) 50 MG tablet TAKE 1 TABLET BY MOUTH EVERY DAY  . Magnesium 400 MG TABS Take 400 mg by mouth daily.  . methocarbamol (ROBAXIN) 500 MG tablet Take 1 tablet (500 mg total) by mouth at bedtime. (Patient taking differently: Take 500 mg by mouth as needed. )  . mometasone (NASONEX) 50 MCG/ACT nasal spray Place 2 sprays into the nose daily.  . simvastatin (ZOCOR) 10 MG tablet TAKE 1 TABLET BY MOUTH EVERYDAY AT BEDTIME  . VITAMIN D, CHOLECALCIFEROL, PO Take 500 Units by mouth daily.   . [DISCONTINUED] hydrochlorothiazide (HYDRODIURIL) 25 MG tablet TAKE 1 TABLET BY MOUTH EVERY DAY  . [DISCONTINUED] Multiple Vitamins-Minerals (HM MULTIVITAMIN ADULT GUMMY PO) Take 2 each by mouth daily.    PHQ 2/9 Scores 04/18/2020 12/08/2019 11/17/2019 08/05/2019  PHQ - 2 Score 0 0 0 0  PHQ- 9 Score 0 0 4 6    GAD 7 : Generalized Anxiety Score 04/18/2020 12/08/2019 11/17/2019  Nervous, Anxious, on Edge 0 0 0  Control/stop worrying 0 0 0  Worry too much - different things 0 0 0  Trouble relaxing 0 1 0  Restless 0 0 0  Easily annoyed or irritable 0 0 0  Afraid - awful might happen 0 0 0  Total GAD 7 Score 0 1 0  Anxiety Difficulty - Not difficult at all Not difficult at all    BP Readings from Last 3 Encounters:  04/18/20 123/68  01/10/20 124/86  12/08/19 132/82    Physical  Exam Vitals and nursing note reviewed.  Constitutional:      General: She is not in acute distress.    Appearance: She is well-developed.  HENT:     Head: Normocephalic and atraumatic.     Right Ear: Tympanic membrane and ear canal normal. Decreased hearing noted.     Left Ear: Hearing, tympanic membrane and ear canal normal.     Nose:     Right Sinus: No maxillary sinus tenderness.     Left Sinus: No maxillary sinus tenderness.  Eyes:     General: No scleral icterus.       Right eye: No discharge.        Left eye: No discharge.     Conjunctiva/sclera: Conjunctivae normal.  Neck:     Thyroid: No thyromegaly.     Vascular: No carotid bruit.  Cardiovascular:     Rate and Rhythm: Normal rate and regular rhythm.     Pulses: Normal pulses.     Heart sounds: Normal heart sounds.  Pulmonary:     Effort: Pulmonary effort is normal. No respiratory distress.     Breath sounds: No wheezing.  Chest:     Chest wall: Swelling present.     Breasts:        Right: No mass, nipple discharge, skin change or tenderness.        Left: No mass, nipple discharge, skin change or tenderness.       Comments: Firm smooth mass about 2 cm size, slightly mobile.  Does not appear to be adenopathy - Abdominal:     General: Bowel sounds are normal.     Palpations: Abdomen is soft.     Tenderness: There is no abdominal tenderness.  Musculoskeletal:        General: Normal range of motion.     Cervical back: Normal range of motion. No erythema.     Right lower leg: No edema.     Left lower leg: No edema.  Lymphadenopathy:     Cervical: No cervical adenopathy.  Skin:    General: Skin is warm and dry.     Capillary Refill: Capillary refill takes less than 2 seconds.     Findings: No rash.  Neurological:     General: No focal deficit present.     Mental Status: She is alert and oriented to person, place, and time.     Cranial Nerves: No cranial nerve deficit.     Sensory: No sensory deficit.     Deep  Tendon Reflexes: Reflexes are normal and symmetric.  Psychiatric:        Attention and Perception: Attention normal.        Mood and Affect: Mood normal.     Wt Readings from Last 3 Encounters:  04/18/20  172 lb (78 kg)  01/10/20 176 lb (79.8 kg)  12/08/19 176 lb (79.8 kg)    BP 123/68   Pulse 75   Ht _0  (1.575 m)   Wt 172 lb (78 kg)   SpO2 98%   BMI 31.46 kg/m   Assessment and Plan: 1. Annual physical exam Normal exam Continue healthy diet and exercise Monitor left chest mass and call for referral if enlarging - POCT urinalysis dipstick  2. Encounter for screening mammogram for breast cancer Scheduled at [facility] next week Will verify that the left chest mass is not related to a breast pathology  3. DM (diabetes mellitus), type 2 with complications ([place]) Clinically stable by exam and report without s/s of hypoglycemia. DM complicated by HTN and lipids. Tolerating medications  well without side effects or other concerns.  Will need to add a second agent if not improving - Comprehensive metabolic panel - Hemoglobin A1c - blood glucose meter kit and supplies KIT; Dispense based on patient and insurance preference. Test BS bid  Dispense: 1 each; Refill: 0  4. Essential (primary) hypertension Clinically stable exam with well controlled BP. Tolerating medications without side effects at this time. Pt to continue current regimen and low sodium diet; benefits of regular exercise as able discussed. - CBC with Differential/Platelet - TSH - hydrochlorothiazide (HYDRODIURIL) 25 MG tablet; Take 1 tablet (25 mg total) by mouth daily.  Dispense: 90 tablet; Refill: 3  5. Gastro-esophageal reflux disease without esophagitis Stable on PPI No red flag sx  6. Hyperlipidemia associated with type 2 diabetes mellitus ([place]) Tolerating statin medication without side effects at this time LDL is at goal of < 70 on current dose Continue same therapy without change at this time. - Lipid  panel  7. Deafness in right ear Since childhood   8. Mass of chest wall, left Pt will monitor and call if changing   Partially dictated using Dragon software. Any errors are unintentional.  [name], MD [facility]  04/18/2020

## 2020-04-19 LAB — CBC WITH DIFFERENTIAL/PLATELET
Basophils Absolute: 0 10*3/uL (ref 0.0–0.2)
Basos: 1 %
EOS (ABSOLUTE): 0.2 10*3/uL (ref 0.0–0.4)
Eos: 3 %
Hematocrit: 37 % (ref 34.0–46.6)
Hemoglobin: 12.5 g/dL (ref 11.1–15.9)
Immature Grans (Abs): 0 10*3/uL (ref 0.0–0.1)
Immature Granulocytes: 1 %
Lymphocytes Absolute: 1.7 10*3/uL (ref 0.7–3.1)
Lymphs: 28 %
MCH: 28.5 pg (ref 26.6–33.0)
MCHC: 33.8 g/dL (ref 31.5–35.7)
MCV: 84 fL (ref 79–97)
Monocytes Absolute: 0.6 10*3/uL (ref 0.1–0.9)
Monocytes: 10 %
Neutrophils Absolute: 3.5 10*3/uL (ref 1.4–7.0)
Neutrophils: 57 %
Platelets: 221 10*3/uL (ref 150–450)
RBC: 4.39 x10E6/uL (ref 3.77–5.28)
RDW: 12.3 % (ref 11.7–15.4)
WBC: 6 10*3/uL (ref 3.4–10.8)

## 2020-04-19 LAB — LIPID PANEL
Chol/HDL Ratio: 2.4 ratio (ref 0.0–4.4)
Cholesterol, Total: 101 mg/dL (ref 100–199)
HDL: 42 mg/dL (ref >39)
LDL Chol Calc (NIH): 47 mg/dL (ref 0–99)
Triglycerides: 48 mg/dL (ref 0–149)
VLDL Cholesterol Cal: 12 mg/dL (ref 5–40)

## 2020-04-19 LAB — COMPREHENSIVE METABOLIC PANEL
ALT: 20 IU/L (ref 0–32)
AST: 25 IU/L (ref 0–40)
Albumin/Globulin Ratio: 1.3 (ref 1.2–2.2)
Albumin: 4.1 g/dL (ref 3.8–4.8)
Alkaline Phosphatase: 79 IU/L (ref 44–121)
BUN/Creatinine Ratio: 11 — ABNORMAL LOW (ref 12–28)
BUN: 8 mg/dL (ref 8–27)
Bilirubin Total: 0.6 mg/dL (ref 0.0–1.2)
CO2: 22 mmol/L (ref 20–29)
Calcium: 9.4 mg/dL (ref 8.7–10.3)
Chloride: 101 mmol/L (ref 96–106)
Creatinine, Ser: 0.7 mg/dL (ref 0.57–1.00)
GFR calc Af Amer: 102 mL/min/{1.73_m2} (ref >59)
GFR calc non Af Amer: 89 mL/min/{1.73_m2} (ref >59)
Globulin, Total: 3.2 g/dL (ref 1.5–4.5)
Glucose: 184 mg/dL — ABNORMAL HIGH (ref 65–99)
Potassium: 3.4 mmol/L — ABNORMAL LOW (ref 3.5–5.2)
Sodium: 140 mmol/L (ref 134–144)
Total Protein: 7.3 g/dL (ref 6.0–8.5)

## 2020-04-19 LAB — HEMOGLOBIN A1C
Est. average glucose Bld gHb Est-mCnc: 180 mg/dL
Hgb A1c MFr Bld: 7.9 % — ABNORMAL HIGH (ref 4.8–5.6)

## 2020-04-19 LAB — TSH: TSH: 1.79 u[IU]/mL (ref 0.450–4.500)

## 2020-04-23 ENCOUNTER — Ambulatory Visit: Payer: Self-pay | Admitting: *Deleted

## 2020-04-23 NOTE — Telephone Encounter (Signed)
Pt called in and was given her lab results message from Dr. [name] dated 04/19/2020 at 1:29 PM.

## 2020-04-23 NOTE — Telephone Encounter (Signed)
Noted  KP 

## 2020-04-25 ENCOUNTER — Ambulatory Visit: Payer: Medicare HMO

## 2020-04-25 ENCOUNTER — Telehealth: Payer: Self-pay | Admitting: Pharmacist

## 2020-04-25 NOTE — Chronic Care Management (AMB) (Addendum)
Chronic Care Management Pharmacy Assistant   Name: Kiylee Thoreson  MRN: [id number] DOB: [birth year]  Reason for Encounter:General Adherence Call  Patient Questions:  1.  Have you seen any other providers since your last visit? No  2.  Any changes in your medicines or health? No   PCP : [name], MD  Allergies:   Allergies  Allergen Reactions   Ace Inhibitors Cough   Metformin And Related Diarrhea   Penicillins Rash   Sulfa Antibiotics Rash    Medications: Outpatient Encounter Medications as of 04/25/2020  Medication Sig   acetaminophen (TYLENOL) 500 MG tablet Take 500 mg by mouth every 6 (six) hours as needed.   amLODipine (NORVASC) 2.5 MG tablet Take 1 tablet (2.5 mg total) by mouth daily.   blood glucose meter kit and supplies KIT Dispense based on patient and insurance preference. Test BS bid   docusate sodium (COLACE) 100 MG capsule Take 100 mg by mouth 2 (two) times daily.   glimepiride (AMARYL) 4 MG tablet Take 1 tablet (4 mg total) by mouth daily before breakfast.   glucose blood (ONE TOUCH ULTRA TEST) test strip E11.9   hydrochlorothiazide (HYDRODIURIL) 25 MG tablet Take 1 tablet (25 mg total) by mouth daily.   ibuprofen (ADVIL,MOTRIN) 600 MG tablet TAKE 1 TABLET (600 MG TOTAL) BY MOUTH EVERY 8 (EIGHT) HOURS AS NEEDED.   Lancets (ONETOUCH ULTRASOFT) lancets Use as instructed   levocetirizine (XYZAL) 5 MG tablet TAKE 1 TABLET BY MOUTH EVERY DAY IN THE EVENING   losartan (COZAAR) 50 MG tablet TAKE 1 TABLET BY MOUTH EVERY DAY   Magnesium 400 MG TABS Take 400 mg by mouth daily.   methocarbamol (ROBAXIN) 500 MG tablet Take 1 tablet (500 mg total) by mouth at bedtime. (Patient taking differently: Take 500 mg by mouth as needed. )   mometasone (NASONEX) 50 MCG/ACT nasal spray Place 2 sprays into the nose daily.   omeprazole (PRILOSEC) 40 MG capsule TAKE 1 CAPSULE BY MOUTH EVERY DAY (Patient not taking: Reported on 03/13/2020)   simvastatin (ZOCOR) 10 MG tablet TAKE  1 TABLET BY MOUTH EVERYDAY AT BEDTIME   VITAMIN D, CHOLECALCIFEROL, PO Take 500 Units by mouth daily.    No facility-administered encounter medications on file as of 04/25/2020.    Current Diagnosis: 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   Mass of chest wall, left 04/18/2020   Keratoderma of foot, acquired 08/05/2019   Hyperlipidemia associated with type 2 diabetes mellitus ([place]) 03/26/2017   Iritis of left eye 03/26/2017   Pre-ulcerative calluses 03/26/2017   Arthritis of right knee 01/15/2016   DM (diabetes mellitus), type 2 with complications ([place]) 51/70/0174   Abnormal LFTs 03/15/2015   Essential (primary) hypertension 03/15/2015   Gastro-esophageal reflux disease without esophagitis 03/15/2015   Allergic rhinitis, seasonal 03/15/2015   Have you seen any other providers since your last visit? Patient reports she has not seen any other providers since last visit with Birdena Crandall on 03/13/20. Patient was seen on 04/18/20 for a annual exam with Dr. [name]. Notes report health is stable and no changes to medications.  Any changes in your medications or health? Patient verbalized there are no changes in her health or medications.  Any side effects from any medications? Patient reports there are no side effects from any of her medications.  Do you have an symptoms or problems not managed by your medications? Patient verbalized she is not  experiencing any symptoms or problems that are not managed by her medications.  Any concerns about your health right now? Patient verbalized with CPA  That she would like to keep her A1C under control.  Has your provider asked that you check blood pressure, blood sugar, or follow special diet at home? Patient verbalized to CPA that her provider has asked her to check her blood pressure and blood sugar daily. No special diet was reported.  Do you get any type of exercise on a regular basis? Patient  states she normally just walks daily.  Can you think of a goal you would like to reach for your health? Patient verbalized to CPA she would like to keep her A1C in normal range.  Do you have any problems getting your medications? Patient states no she does not have any issues getting her medications from preferred pharmacy.  Is there anything that you would like to discuss during the appointment? Discuss the reason why she has been referred to speak with PharmD and get more details for the services from what CPA has already discussed with the patient.   Please bring medications and supplements to appointment   04/25/20- CPA attempted to contact the patient to perform a general assesment. Phone number listed continues to ring with no voice mailbox to leave a HIPAA compliant message.  Incoming Call at 3:06 PM- Patient returned phone call to perform and complete General Assessment. The patient is inquiring why she was referred to the CCM program and would like to speak with Almyra Free to fully understand the purpose of the CCM call scheduled for tomorrow at 9:00 am. Patient is unavailable for CCM call scheduled at 9:00 am tomorrow. Patient requested to speak briefly on next Tuesday 12/14 when she is available. She has no urgent needs or any concerns at this moment. Just would like to keep her A1c within normal ranges.   Raynelle Highland, [place] Assistant [phone]  Follow-Up:  Pharmacist Review.  I have reviewed the care management and care coordination activities outlined in this encounter and I am certifying that I agree with the content of this note.  Junita Push. Kenton Kingfisher PharmD, [facility] [phone]

## 2020-04-25 NOTE — Progress Notes (Signed)
Open in Error.

## 2020-04-26 ENCOUNTER — Telehealth: Payer: Self-pay

## 2020-04-30 ENCOUNTER — Other Ambulatory Visit: Payer: Self-pay

## 2020-04-30 ENCOUNTER — Ambulatory Visit (INDEPENDENT_AMBULATORY_CARE_PROVIDER_SITE_OTHER): Payer: Medicare HMO

## 2020-04-30 ENCOUNTER — Telehealth: Payer: Self-pay | Admitting: Internal Medicine

## 2020-04-30 VITALS — BP 140/80 | HR 89 | Temp 98.1°F | Resp 16 | Ht 62.0 in | Wt 169.4 lb

## 2020-04-30 DIAGNOSIS — Z0000 Encounter for general adult medical examination without abnormal findings: Secondary | ICD-10-CM | POA: Diagnosis not present

## 2020-04-30 DIAGNOSIS — E118 Type 2 diabetes mellitus with unspecified complications: Secondary | ICD-10-CM | POA: Diagnosis not present

## 2020-04-30 DIAGNOSIS — Z599 Problem related to housing and economic circumstances, unspecified: Secondary | ICD-10-CM

## 2020-04-30 NOTE — Telephone Encounter (Signed)
Kathy Dougherty 04/30/2020 Called pt regarding community resource referral received. My info is [phone] please see ref notes for more details. Thank you.  [place], Care Management

## 2020-04-30 NOTE — Patient Instructions (Addendum)
[name] , Thank you for taking time to come for your Medicare Wellness Visit. I appreciate your ongoing commitment to your health goals. Please review the following plan we discussed and let me know if I can assist you in the future.   Screening recommendations/referrals: Colonoscopy: done 12/14/12 Mammogram: done 05/06/19. Scheduled at [facility] Bone Density: done 08/18/17 Recommended yearly ophthalmology/optometry visit for glaucoma screening and checkup Recommended yearly dental visit for hygiene and checkup  Vaccinations: Influenza vaccine: done 03/13/20 Pneumococcal vaccine: done 03/23/17 Tdap vaccine: due Shingles vaccine: Shingrix discussed. Please contact your pharmacy for coverage information.  Covid-19:done 06/24/19, 07/26/19 & 03/19/20  Advanced directives: Please bring a copy of your health care power of attorney and living will to the office at your convenience once you have completed that paperwork  Conditions/risks identified: Recommend healthy eating and physical activity to lower A1c. You may also visit the following websites for additional information:  Www.diabetes.org  [email or web address].[email or web address]  Next appointment: Follow up in one year for your annual wellness visit    Preventive Care 65 Years and Older, Female Preventive care refers to lifestyle choices and visits with your health care provider that can promote health and wellness. What does preventive care include?  A yearly physical exam. This is also called an annual well check.  Dental exams once or twice a year.  Routine eye exams. Ask your health care provider how often you should have your eyes checked.  Personal lifestyle choices, including:  Daily care of your teeth and gums.  Regular physical activity.  Eating a healthy diet.  Avoiding tobacco and drug use.  Limiting alcohol use.  Practicing safe sex.  Taking low-dose aspirin every day.  Taking vitamin and mineral  supplements as recommended by your health care provider. What happens during an annual well check? The services and screenings done by your health care provider during your annual well check will depend on your age, overall health, lifestyle risk factors, and family history of disease. Counseling  Your health care provider may ask you questions about your:  Alcohol use.  Tobacco use.  Drug use.  Emotional well-being.  Home and relationship well-being.  Sexual activity.  Eating habits.  History of falls.  Memory and ability to understand (cognition).  Work and work Statistician.  Reproductive health. Screening  You may have the following tests or measurements:  Height, weight, and BMI.  Blood pressure.  Lipid and cholesterol levels. These may be checked every 5 years, or more frequently if you are over 39 years old.  Skin check.  Lung cancer screening. You may have this screening every year starting at age 3 if you have a 30-pack-year history of smoking and currently smoke or have quit within the past 15 years.  Fecal occult blood test (FOBT) of the stool. You may have this test every year starting at age 84.  Flexible sigmoidoscopy or colonoscopy. You may have a sigmoidoscopy every 5 years or a colonoscopy every 10 years starting at age 82.  Hepatitis C blood test.  Hepatitis B blood test.  Sexually transmitted disease (STD) testing.  Diabetes screening. This is done by checking your blood sugar (glucose) after you have not eaten for a while (fasting). You may have this done every 1-3 years.  Bone density scan. This is done to screen for osteoporosis. You may have this done starting at age 82.  Mammogram. This may be done every 1-2 years. Talk to your health care provider about how often you  should have regular mammograms. Talk with your health care provider about your test results, treatment options, and if necessary, the need for more tests. Vaccines  Your  health care provider may recommend certain vaccines, such as:  Influenza vaccine. This is recommended every year.  Tetanus, diphtheria, and acellular pertussis (Tdap, Td) vaccine. You may need a Td booster every 10 years.  Zoster vaccine. You may need this after age 63.  Pneumococcal 13-valent conjugate (PCV13) vaccine. One dose is recommended after age 9.  Pneumococcal polysaccharide (PPSV23) vaccine. One dose is recommended after age 61. Talk to your health care provider about which screenings and vaccines you need and how often you need them. This information is not intended to replace advice given to you by your health care provider. Make sure you discuss any questions you have with your health care provider. Document Released: 06/01/2015 Document Revised: 01/23/2016 Document Reviewed: 03/06/2015 Elsevier Interactive Patient Education  2017 [place] Prevention in the Home Falls can cause injuries. They can happen to people of all ages. There are many things you can do to make your home safe and to help prevent falls. What can I do on the outside of my home?  Regularly fix the edges of walkways and driveways and fix any cracks.  Remove anything that might make you trip as you walk through a door, such as a raised step or threshold.  Trim any bushes or trees on the path to your home.  Use bright outdoor lighting.  Clear any walking paths of anything that might make someone trip, such as rocks or tools.  Regularly check to see if handrails are loose or broken. Make sure that both sides of any steps have handrails.  Any raised decks and porches should have guardrails on the edges.  Have any leaves, snow, or ice cleared regularly.  Use sand or salt on walking paths during winter.  Clean up any spills in your garage right away. This includes oil or grease spills. What can I do in the bathroom?  Use night lights.  Install grab bars by the toilet and in the tub and  shower. Do not use towel bars as grab bars.  Use non-skid mats or decals in the tub or shower.  If you need to sit down in the shower, use a plastic, non-slip stool.  Keep the floor dry. Clean up any water that spills on the floor as soon as it happens.  Remove soap buildup in the tub or shower regularly.  Attach bath mats securely with double-sided non-slip rug tape.  Do not have throw rugs and other things on the floor that can make you trip. What can I do in the bedroom?  Use night lights.  Make sure that you have a light by your bed that is easy to reach.  Do not use any sheets or blankets that are too big for your bed. They should not hang down onto the floor.  Have a firm chair that has side arms. You can use this for support while you get dressed.  Do not have throw rugs and other things on the floor that can make you trip. What can I do in the kitchen?  Clean up any spills right away.  Avoid walking on wet floors.  Keep items that you use a lot in easy-to-reach places.  If you need to reach something above you, use a strong step stool that has a grab bar.  Keep electrical cords out  of the way.  Do not use floor polish or wax that makes floors slippery. If you must use wax, use non-skid floor wax.  Do not have throw rugs and other things on the floor that can make you trip. What can I do with my stairs?  Do not leave any items on the stairs.  Make sure that there are handrails on both sides of the stairs and use them. Fix handrails that are broken or loose. Make sure that handrails are as long as the stairways.  Check any carpeting to make sure that it is firmly attached to the stairs. Fix any carpet that is loose or worn.  Avoid having throw rugs at the top or bottom of the stairs. If you do have throw rugs, attach them to the floor with carpet tape.  Make sure that you have a light switch at the top of the stairs and the bottom of the stairs. If you do not  have them, ask someone to add them for you. What else can I do to help prevent falls?  Wear shoes that:  Do not have high heels.  Have rubber bottoms.  Are comfortable and fit you well.  Are closed at the toe. Do not wear sandals.  If you use a stepladder:  Make sure that it is fully opened. Do not climb a closed stepladder.  Make sure that both sides of the stepladder are locked into place.  Ask someone to hold it for you, if possible.  Clearly mark and make sure that you can see:  Any grab bars or handrails.  First and last steps.  Where the edge of each step is.  Use tools that help you move around (mobility aids) if they are needed. These include:  Canes.  Walkers.  Scooters.  Crutches.  Turn on the lights when you go into a dark area. Replace any light bulbs as soon as they burn out.  Set up your furniture so you have a clear path. Avoid moving your furniture around.  If any of your floors are uneven, fix them.  If there are any pets around you, be aware of where they are.  Review your medicines with your doctor. Some medicines can make you feel dizzy. This can increase your chance of falling. Ask your doctor what other things that you can do to help prevent falls. This information is not intended to replace advice given to you by your health care provider. Make sure you discuss any questions you have with your health care provider. Document Released: 03/01/2009 Document Revised: 10/11/2015 Document Reviewed: 06/09/2014 Elsevier Interactive Patient Education  2017 [employer].

## 2020-04-30 NOTE — Progress Notes (Signed)
Subjective:   Kathy Dougherty is a 69 y.o. female who presents for Medicare Annual (Subsequent) preventive examination.  Review of Systems     Cardiac Risk Factors include: advanced age (>12mn, >>35women);diabetes mellitus;dyslipidemia;hypertension;obesity (BMI >30kg/m2)     Objective:    Today's Vitals   04/30/20 0853  BP: 140/80  Pulse: 89  Resp: 16  Temp: 98.1 F (36.7 C)  TempSrc: Oral  SpO2: 99%  Weight: 169 lb 6.4 oz (76.8 kg)  Height: _0  (1.575 m)   Body mass index is 30.98 kg/m.  Advanced Directives 04/30/2020 04/25/2019 04/19/2018 03/23/2017 06/19/2015  Does Patient Have a Medical Advance Directive? _1   Would patient like information on creating a medical advance directive? No - Patient declined Yes (MAU/Ambulatory/Procedural Areas - Information given) No - Patient declined Yes (MAU/Ambulatory/Procedural Areas - Information given) -    Current Medications (verified) Outpatient Encounter Medications as of 04/30/2020  Medication Sig  . acetaminophen (TYLENOL) 500 MG tablet Take 500 mg by mouth every 6 (six) hours as needed.  .Marland KitchenamLODipine (NORVASC) 2.5 MG tablet Take 1 tablet (2.5 mg total) by mouth daily.  . blood glucose meter kit and supplies KIT Dispense based on patient and insurance preference. Test BS bid  . docusate sodium (COLACE) 100 MG capsule Take 100 mg by mouth 2 (two) times daily.  .Marland Kitchenglimepiride (AMARYL) 4 MG tablet Take 1 tablet (4 mg total) by mouth daily before breakfast.  . glucose blood (ONE TOUCH ULTRA TEST) test strip E11.9  . hydrochlorothiazide (HYDRODIURIL) 25 MG tablet Take 1 tablet (25 mg total) by mouth daily.  .Marland Kitchenibuprofen (ADVIL,MOTRIN) 600 MG tablet TAKE 1 TABLET (600 MG TOTAL) BY MOUTH EVERY 8 (EIGHT) HOURS AS NEEDED.  .Marland KitchenLancets (ONETOUCH ULTRASOFT) lancets Use as instructed  . levocetirizine (XYZAL) 5 MG tablet TAKE 1 TABLET BY MOUTH EVERY DAY IN THE EVENING  . losartan (COZAAR) 50 MG tablet TAKE 1 TABLET BY MOUTH EVERY  DAY  . Magnesium 400 MG TABS Take 400 mg by mouth daily.  . mometasone (NASONEX) 50 MCG/ACT nasal spray Place 2 sprays into the nose daily.  .Marland Kitchenomeprazole (PRILOSEC) 40 MG capsule TAKE 1 CAPSULE BY MOUTH EVERY DAY  . simvastatin (ZOCOR) 10 MG tablet TAKE 1 TABLET BY MOUTH EVERYDAY AT BEDTIME  . VITAMIN D, CHOLECALCIFEROL, PO Take 500 Units by mouth daily.   . [DISCONTINUED] methocarbamol (ROBAXIN) 500 MG tablet Take 1 tablet (500 mg total) by mouth at bedtime. (Patient taking differently: Take 500 mg by mouth as needed. )   No facility-administered encounter medications on file as of 04/30/2020.    Allergies (verified) Ace inhibitors, Metformin and related, Penicillins, and Sulfa antibiotics   History: Past Medical History:  Diagnosis Date  . Diabetes mellitus without complication ([place]   . GERD (gastroesophageal reflux disease)   . Hyperlipidemia   . Hypertension   . Seasonal allergies    Past Surgical History:  Procedure Laterality Date  . CATARACT EXTRACTION Right 08/2014  . CATARACT EXTRACTION Left 04/2016  . HAMMER TOE SURGERY    . PARTIAL HYSTERECTOMY     fibroids   Family History  Problem Relation Age of Onset  . Diabetes Mother   . Cancer Brother   . Diabetes Sister   . Cancer Brother   . Heart disease Brother   . Sudden death Brother   . Heart disease Brother    Social History   Socioeconomic History  . Marital status: Divorced  Spouse name: Not on file  . Number of children: 0  . Years of education: Not on file  . Highest education level: Some college, no degree  Occupational History  . Occupation: retired  Tobacco Use  . Smoking status: Never Smoker  . Smokeless tobacco: Never Used  Vaping Use  . Vaping Use: Never used  Substance and Sexual Activity  . Alcohol use: No    Alcohol/week: 0.0 standard drinks  . Drug use: No  . Sexual activity: Not Currently  Other Topics Concern  . Not on file  Social History Narrative   Pt lives alone.     Social Determinants of Health   Financial Resource Strain: Medium Risk  . Difficulty of Paying Living Expenses: Somewhat hard  Food Insecurity: Food Insecurity Present  . Worried About Charity fundraiser in the Last Year: Sometimes true  . Ran Out of Food in the Last Year: Never true  Transportation Needs: No Transportation Needs  . Lack of Transportation (Medical): No  . Lack of Transportation (Non-Medical): No  Physical Activity: Sufficiently Active  . Days of Exercise per Week: 3 days  . Minutes of Exercise per Session: 60 min  Stress: No Stress Concern Present  . Feeling of Stress : Only a little  Social Connections: Socially Isolated  . Frequency of Communication with Friends and Family: More than three times a week  . Frequency of Social Gatherings with Friends and Family: More than three times a week  . Attends Religious Services: Never  . Active Member of Clubs or Organizations: No  . Attends Archivist Meetings: Never  . Marital Status: Divorced    Tobacco Counseling Counseling given: Not Answered   Clinical Intake:  Pre-visit preparation completed: Yes  Pain : No/denies pain     BMI - recorded: 30.98 Nutritional Status: BMI > 30  Obese Nutritional Risks: None Diabetes: Yes CBG done?: No Did pt. bring in CBG monitor from home?: No  How often do you need to have someone help you when you read instructions, pamphlets, or other written materials from your doctor or pharmacy?: 1 - Never  Nutrition Risk Assessment:  Has the patient had any N/V/D within the last 2 months?  No  Does the patient have any non-healing wounds?  No  Has the patient had any unintentional weight loss or weight gain?  No   Diabetes:  Is the patient diabetic?  Yes  If diabetic, was a CBG obtained today?  No  Did the patient bring in their glucometer from home?  No  How often do you monitor your CBG's? 2-3 times daily .   Financial Strains and Diabetes  Management:  Are you having any financial strains with the device, your supplies or your medication? No .  Does the patient want to be seen by Chronic Care Management for management of their diabetes?  Yes  - already enrolled Would the patient like to be referred to a Nutritionist or for Diabetic Management?  Yes  Diabetic Exams:  Diabetic Eye Exam: Completed 11/11/19 negative retinopathy Dr. Lucia Gaskins.   Diabetic Foot Exam: Completed 11/17/19.   Interpreter Needed?: No  Information entered by :: Clemetine Marker LPN   Activities of Daily Living In your present state of health, do you have any difficulty performing the following activities: 04/30/2020  Hearing? Y  Comment declines hearing aids  Vision? N  Difficulty concentrating or making decisions? N  Walking or climbing stairs? N  Dressing or bathing? N  Doing errands, shopping? N  Preparing Food and eating ? N  Using the Toilet? N  In the past six months, have you accidently leaked urine? N  Do you have problems with loss of bowel control? N  Managing your Medications? N  Managing your Finances? N  Housekeeping or managing your Housekeeping? N  Some recent data might be hidden    Patient Care Team: [name], MD as PCP - General (Internal Medicine) Griffin Dakin (Optometry) Vladimir Faster, [facility] (Pharmacist)  Indicate any recent Medical Services you may have received from other than [facility] providers in the past year (date may be approximate).     Assessment:   This is a routine wellness examination for [place].  Hearing/Vision screen  Hearing Screening   _0  _1  _2  _3  _4  _5  _6  _7  _8   Right ear:           Left ear:           Comments: Pt c/o difficulty hearing in right ear but left ear is fine   Vision Screening Comments: Annual vision screenings with Dr. Lucia Gaskins  Dietary issues and exercise activities discussed: Current Exercise Habits: Home exercise routine, Type of exercise:  walking, Time (Minutes): 60, Frequency (Times/Week): 3, Weekly Exercise (Minutes/Week): 180, Intensity: Mild, Exercise limited by: None identified  Goals    . Increase physical activity     Join Silver Sneakers and increase physical activity to 150 minutes per week.     [name] Pharmacy care plan     CARE PLAN ENTRY (see longitudinal plan of care for additional care plan information)  Current Barriers:  . Chronic Disease Management support, education, and care coordination needs related to Hypertension, Hyperlipidemia, Diabetes, GERD, Osteoarthritis, and Allergic Rhinitis   Hypertension BP Readings from Last 3 Encounters:  01/10/20 124/86  12/08/19 132/82  11/17/19 132/76   . Pharmacist Clinical Goal(s): o Over the next 60 days, patient will work with PharmD and providers to maintain BP goal <130/80 . Current regimen:  . Amlodipine 2.5 mg qd . HCTZ 25 mg qd . Losartan 50 mg qd . Interventions: .  Counseled on proper BP technique   . Will collaborate with PCP to send Rx for new glucometer. [name] follow-up with rep for CGM coverage/cost and notify patint. . Patient self care activities - Over the next 60 days, patient will: o Check BP 2-3 times weekly document, and provide at future appointments o Ensure daily salt intake < 2300 mg/day  Hyperlipidemia Lab Results  Component Value Date/Time   LDLCALC 55 04/18/2019 09:18 AM   . Pharmacist Clinical Goal(s): o Over the next 60 days, patient will work with PharmD and providers to maintain LDL goal < 70 . Current regimen:  o Simvastatin 10 mg qd . Interventions: o Provided diet and exercise counseling. . Patient self care activities - Over the next 60 days, patient will: o Increase exercise to goal of 30 min/cay 5 days/week   Diabetes Lab Results  Component Value Date/Time   HGBA1C 8.7 (H) 12/08/2019 08:55 AM   HGBA1C 9.2 (H) 08/05/2019 09:17 AM   . Pharmacist Clinical Goal(s): o Over the next 60 days, patient will work  with PharmD and providers to achieve A1c goal <7% . Current regimen:  o Glimepiride 4 mg ac breakfast . Interventions:  o Reviewed goal BG readings for an A1c of <7%, we want to see fasting sugars <130 and 2 hour after meal sugars <180.  o Provided diet and exercise  counseling. . Patient self care activities - Over the next 60 days, patient will: o Check blood sugar twice daily, document, and provide at future appointments o Contact provider with any episodes of hypoglycemia  Medication management . Pharmacist Clinical Goal(s): o Over the next 60 days, patient will work with PharmD and providers to achieve optimal medication adherence . Current pharmacy: [facility] . Interventions o Comprehensive medication review performed. o Continue current medication management strategy . Patient self care activities - Over the next 60 days, patient will: o Focus on medication adherence by fill dates o Take medications as prescribed o Report any questions or concerns to PharmD and/or provider(s)  Initial goal documentation     . PharmD - Diabetes Education     Current Barriers:  . Chronic Disease Management support, education, and care coordination needs related to HTN, HLD, and DMII  Pharmacist Clinical Goal(s):  [name] Over the next 1 days, patient will work with CM Pharmacist to address needs related to diabetes management education  Interventions: . Address patient's questions related to the Dexcom Continuous Glucose Monitoring (CGM) System eligibility requirements through her health plan coverage.  o Based on information from patient and chart review, patient does not currently meet Medicare Coverage Criteria for this type of device . Provide patient with education on basics of type 2 diabetes mellitus diease state as well as diabetes medication management . Counsel patient on importance of blood sugar control o Reports currently taking glimepiride 2 mg once daily before breakfast o Reports morning  fasting CBGs ranging 120-140s o Denies any recent low blood sugars.  o Counsel on role of nutrition and exercise in blood sugar control - Encourage patient in goal of walking 30 minutes/day x 5 days/week o Counsel on importance of regular well-balance meals and control of carbohydrate portion size. [name] Denies further medication questions/concerns. . Patient declines offer for future outreach from [facility] Pharmacist.  Patient Self Care Activities:  . Calls provider office for new concerns or questions  Initial goal documentation     . Prevent Falls     Recommended to remove items from the home that may cause slips or trips.      Depression Screen PHQ 2/9 Scores 04/30/2020 04/18/2020 12/08/2019 11/17/2019 08/05/2019 07/11/2019 04/25/2019  PHQ - 2 Score 0 0 0 0 0 0 2  PHQ- 9 Score 3 0 0 _0 Fall Risk Fall Risk  04/30/2020 04/18/2020 12/08/2019 11/17/2019 08/05/2019  Falls in the past year? 0 0 0 0 0  Number falls in past yr: 0 - - 0 0  Injury with Fall? 0 - - 0 0  Risk for fall due to : No Fall Risks - No Fall Risks No Fall Risks No Fall Risks  Follow up Falls prevention discussed Falls evaluation completed Falls evaluation completed Falls evaluation completed Falls evaluation completed    [place]:  Any stairs in or around the home? No  If so, are there any without handrails? No  Home free of loose throw rugs in walkways, pet beds, electrical cords, etc? Yes  Adequate lighting in your home to reduce risk of falls? Yes   ASSISTIVE DEVICES UTILIZED TO PREVENT FALLS:  Life alert? No  Use of a cane, walker or w/c? No  Grab bars in the bathroom? Yes  Shower chair or bench in shower? No  Elevated toilet seat or a handicapped toilet? No   TIMED UP AND GO:  Was  the test performed? Yes .  Length of time to ambulate 10 feet: 5 sec.   Gait steady and fast without use of assistive device  Cognitive Function:     6CIT Screen 04/30/2020 04/25/2019  03/23/2017  What Year? 0 points 0 points 0 points  What month? 0 points 0 points 0 points  What time? 0 points 0 points 0 points  Count back from 20 0 points 0 points 0 points  Months in reverse 0 points 0 points 0 points  Repeat phrase 0 points 0 points 2 points  Total Score 0 0 2    Immunizations Immunization History  Administered Date(s) Administered  . Fluad Quad(high Dose 65+) 02/18/2019, 03/13/2020  . Influenza, High Dose Seasonal PF 03/23/2017, 03/22/2018  . Influenza,inj,Quad PF,6+ Mos 03/19/2015, 03/20/2016  . Moderna Sars-Covid-2 Vaccination 06/24/2019, 07/26/2019, 03/19/2020  . Pneumococcal Conjugate-13 03/20/2016  . Pneumococcal Polysaccharide-23 03/23/2017  . Zoster 11/05/2012    TDAP status: Due, Education has been provided regarding the importance of this vaccine. Advised may receive this vaccine at local pharmacy or Health Dept. Aware to provide a copy of the vaccination record if obtained from local pharmacy or Health Dept. Verbalized acceptance and understanding.  Flu Vaccine status: Up to date  Pneumococcal vaccine status: Up to date  Covid-19 vaccine status: Completed vaccines  Qualifies for Shingles Vaccine? Yes   Zostavax completed Yes   Shingrix Completed?: No.    Education has been provided regarding the importance of this vaccine. Patient has been advised to call insurance company to determine out of pocket expense if they have not yet received this vaccine. Advised may also receive vaccine at local pharmacy or Health Dept. Verbalized acceptance and understanding.  Screening Tests Health Maintenance  Topic Date Due  . TETANUS/TDAP  12/07/2020 (Originally 02/09/1970)  . MAMMOGRAM  05/05/2020  . HEMOGLOBIN A1C  10/17/2020  . OPHTHALMOLOGY EXAM  11/10/2020  . FOOT EXAM  11/16/2020  . COLONOSCOPY  05/20/2022  . INFLUENZA VACCINE  Completed  . DEXA SCAN  Completed  . COVID-19 Vaccine  Completed  . PNA vac Low Risk Adult  Completed  . Hepatitis C  Screening  Addressed    Health Maintenance  There are no preventive care reminders to display for this patient.  Colorectal cancer screening: Type of screening: Colonoscopy. Completed 12/14/12. Repeat every 10 years  Mammogram status: Completed 05/06/19. Repeat every year. Scheduled at [facility].   Bone Density status: Completed 08/18/17. Results reflect: Bone density results: OSTEOPENIA. Repeat every 2 years.  Lung Cancer Screening: (Low Dose CT Chest recommended if Age 39-80 years, 30 pack-year currently smoking OR have quit w/in 15years.) does not qualify.   Additional Screening:  Hepatitis C Screening: does qualify; Completed 12/01/12  Vision Screening: Recommended annual ophthalmology exams for early detection of glaucoma and other disorders of the eye. Is the patient up to date with their annual eye exam?  Yes  Who is the provider or what is the name of the office in which the patient attends annual eye exams? Dr. Lucia Gaskins  Dental Screening: Recommended annual dental exams for proper oral hygiene  Community Resource Referral / Chronic Care Management: CRR required this visit?  Yes   CCM required this visit?  No      Plan:     I have personally reviewed and noted the following in the patient's chart:   . Medical and social history . Use of alcohol, tobacco or illicit drugs  . Current medications and supplements .  Functional ability and status . Nutritional status . Physical activity . Advanced directives . List of other physicians . Hospitalizations, surgeries, and ER visits in previous 12 months . Vitals . Screenings to include cognitive, depression, and falls . Referrals and appointments  In addition, I have reviewed and discussed with patient certain preventive protocols, quality metrics, and best practice recommendations. A written personalized care plan for preventive services as well as general preventive health recommendations were provided to  patient.     Clemetine Marker, LPN   [id number]   Nurse Notes: pt doing well and appreciative of visit today; follow up with [facility]CM pharmacist next week for DM management. Referral also sent for nutrition/diabetes management.

## 2020-05-07 ENCOUNTER — Telehealth: Payer: Self-pay

## 2020-05-07 DIAGNOSIS — N6320 Unspecified lump in the left breast, unspecified quadrant: Secondary | ICD-10-CM | POA: Diagnosis not present

## 2020-05-07 DIAGNOSIS — Z1231 Encounter for screening mammogram for malignant neoplasm of breast: Secondary | ICD-10-CM | POA: Diagnosis not present

## 2020-05-07 NOTE — Telephone Encounter (Signed)
[facility] called saying that patient is complaining lump in her breast. She is there today for screening mammogram and they said in order to evaluate the lump they need an order for a diagnostic mammogram and [place] if needed sent to their office. Explained per Dr. [name] that the patient was evaluated for this issue on 12/01 - as seen in her note - and a diagnostic mammo is not needed. If the patient continues to have issue with this spot, has increasing pain or notices the place on her skin growing then told her to come back and see Dr. [name] for further evaluation.   Patient was heard in the background on the phone verbalizing understanding of this and said its okay to continue with just the screening mammogram today.  Stefhanie Kachmar

## 2020-05-08 ENCOUNTER — Telehealth: Payer: Self-pay

## 2020-05-16 ENCOUNTER — Telehealth: Payer: Self-pay | Admitting: Internal Medicine

## 2020-05-16 NOTE — Telephone Encounter (Signed)
  Care Management   Note  05/16/2020 Name: Kathy Dougherty MRN: [id number] DOB: 09/03/1950  Kathy Dougherty is a 69 y.o. year old female who is a primary care patient of Reubin Milan, MD and is actively engaged with the care management team. I reached out to Rulon Abide by phone today to assist with re-scheduling a follow up visit with the Pharmacist  Follow up plan: Telephone appointment with care management team member scheduled for: 05/22/20 @ 9:00am  Rojelio Brenner Care Guide, Embedded Care Coordination

## 2020-05-16 NOTE — Telephone Encounter (Signed)
Kathy Dougherty 05/16/2020 Called pt regarding community resource referral received. My info is [phone] please see ref notes for more details. Thank you.  Kathy Dougherty Care Guide, Embedded Care Coordination [facility], Care Management

## 2020-05-17 ENCOUNTER — Telehealth: Payer: Medicare HMO

## 2020-05-22 ENCOUNTER — Ambulatory Visit: Payer: Medicare HMO | Admitting: Pharmacist

## 2020-05-22 DIAGNOSIS — I10 Essential (primary) hypertension: Secondary | ICD-10-CM

## 2020-05-22 DIAGNOSIS — E118 Type 2 diabetes mellitus with unspecified complications: Secondary | ICD-10-CM

## 2020-05-22 NOTE — Patient Instructions (Addendum)
Visit Information  It was a pleasure speaking with you today. Thank you for letting me be part of your clinical team. Please call with any questions or concerns.   Goals Addressed            This Visit's Progress   . Pharmacy care plan       CARE PLAN ENTRY (see longitudinal plan of care for additional care plan information)  Current Barriers:  . Chronic Disease Management support, education, and care coordination needs related to Hypertension, Hyperlipidemia, Diabetes, GERD, Osteoarthritis, and Allergic Rhinitis   Hypertension BP Readings from Last 3 Encounters:  04/30/20 140/80  04/18/20 123/68  01/10/20 124/86   . Pharmacist Clinical Goal(s): o Over the next 60 days, patient will work with PharmD and providers to maintain BP goal <130/80 . Current regimen:  . Amlodipine 2.5 mg qd . HCTZ 25 mg qd . Losartan 50 mg qd . Interventions: .  Counseled on proper BP technique   . Disucussed changing administration times of BP meds to HS . Provided diet and exercise counseling. . Patient self care activities - Over the next 60 days, patient will: o Check BP daily document, and provide at future appointments o Ensure daily salt intake < 2300 mg/day  Hyperlipidemia Lab Results  Component Value Date/Time   LDLCALC 47 04/18/2020 09:31 AM   . Pharmacist Clinical Goal(s): o Over the next 60 days, patient will work with PharmD and providers to maintain LDL goal < 70 . Current regimen:  o Simvastatin 10 mg qd . Interventions: o Provided diet and exercise counseling. . Patient self care activities - Over the next 60 days, patient will: o Increase exercise to goal of 30 min/cay 5 days/week   Diabetes Lab Results  Component Value Date/Time   HGBA1C 7.9 (H) 04/18/2020 09:31 AM   HGBA1C 8.7 (H) 12/08/2019 08:55 AM   . Pharmacist Clinical Goal(s): o Over the next 60 days, patient will work with PharmD and providers to achieve A1c goal <7% . Current regimen:  o Glimepiride 4 mg  ac breakfast . Interventions:  o Reviewed goal BG readings for an A1c of <7%, we want to see fasting sugars <130 and 2 hour after meal sugars <180.  o Provided diet and exercise counseling. o Discussed medicare requirements for CGM coverage and out of pocket cost. Patient is willing to pay for device and supplies. . Patient self care activities - Over the next 60 days, patient will: o Check blood sugar twice daily, document, and provide at future appointments o Contact provider with any episodes of hypoglycemia o Continue healthy diet and increase exercise to 30 min/ 5 days per week.  Medication management . Pharmacist Clinical Goal(s): o Over the next 60 days, patient will work with PharmD and providers to achieve optimal medication adherence . [facility] . Interventions o Comprehensive medication review performed. o Continue current medication management strategy . Patient self care activities - Over the next 60 days, patient will: o Focus on medication adherence by fill dates o Take medications as prescribed o Report any questions or concerns to PharmD and/or provider(s)  Please see past updates related to this goal by clicking on the "Past Updates" button in the selected goal         The patient verbalized understanding of instructions, educational materials, and care plan provided today and agreed to receive a mailed copy of patient instructions, educational materials, and care plan.   Telephone follow up appointment with pharmacy team  member scheduled for: 2 months  Junita Push. Kenton Kingfisher PharmD, BCPS Clinical Pharmacist [phone]  Managing Your Hypertension Hypertension is commonly called high blood pressure. This is when the force of your blood pressing against the walls of your arteries is too strong. Arteries are blood vessels that carry blood from your heart throughout your body. Hypertension forces the heart to work harder to pump blood, and may cause the  arteries to become narrow or stiff. Having untreated or uncontrolled hypertension can cause heart attack, stroke, kidney disease, and other problems. What are blood pressure readings? A blood pressure reading consists of a higher number over a lower number. Ideally, your blood pressure should be below 120/80. The first ("top") number is called the systolic pressure. It is a measure of the pressure in your arteries as your heart beats. The second ("bottom") number is called the diastolic pressure. It is a measure of the pressure in your arteries as the heart relaxes. What does my blood pressure reading mean? Blood pressure is classified into four stages. Based on your blood pressure reading, your health care provider may use the following stages to determine what type of treatment you need, if any. Systolic pressure and diastolic pressure are measured in a unit called mm Hg. Normal  Systolic pressure: below 123456.  Diastolic pressure: below 80. Elevated  Systolic pressure: [id number].  Diastolic pressure: below 80. Hypertension stage 1  Systolic pressure: 0000000.  Diastolic pressure: [id number]. Hypertension stage 2  Systolic pressure: [id number] or above.  Diastolic pressure: 90 or above. What health risks are associated with hypertension? Managing your hypertension is an important responsibility. Uncontrolled hypertension can lead to:  A heart attack.  A stroke.  A weakened blood vessel (aneurysm).  Heart failure.  Kidney damage.  Eye damage.  Metabolic syndrome.  Memory and concentration problems. What changes can I make to manage my hypertension? Hypertension can be managed by making lifestyle changes and possibly by taking medicines. Your health care provider will help you make a plan to bring your blood pressure within a normal range. Eating and drinking   Eat a diet that is high in fiber and potassium, and low in salt (sodium), added sugar, and fat. An example eating plan is  called the DASH (Dietary Approaches to Stop Hypertension) diet. To eat this way: ? Eat plenty of fresh fruits and vegetables. Try to fill half of your plate at each meal with fruits and vegetables. ? Eat whole grains, such as whole wheat pasta, brown rice, or whole grain bread. Fill about one quarter of your plate with whole grains. ? Eat low-fat diary products. ? Avoid fatty cuts of meat, processed or cured meats, and poultry with skin. Fill about one quarter of your plate with lean proteins such as fish, chicken without skin, beans, eggs, and tofu. ? Avoid premade and processed foods. These tend to be higher in sodium, added sugar, and fat.  Reduce your daily sodium intake. Most people with hypertension should eat less than 1,500 mg of sodium a day.  Limit alcohol intake to no more than 1 drink a day for nonpregnant women and 2 drinks a day for men. One drink equals 12 oz of beer, 5 oz of wine, or 1 oz of hard liquor. Lifestyle  Work with your health care provider to maintain a healthy body weight, or to lose weight. Ask what an ideal weight is for you.  Get at least 30 minutes of exercise that causes your heart to beat faster (aerobic  exercise) most days of the week. Activities may include walking, swimming, or biking.  Include exercise to strengthen your muscles (resistance exercise), such as weight lifting, as part of your weekly exercise routine. Try to do these types of exercises for 30 minutes at least 3 days a week.  Do not use any products that contain nicotine or tobacco, such as cigarettes and e-cigarettes. If you need help quitting, ask your health care provider.  Control any long-term (chronic) conditions you have, such as high cholesterol or diabetes. Monitoring  Monitor your blood pressure at home as told by your health care provider. Your personal target blood pressure may vary depending on your medical conditions, your age, and other factors.  Have your blood pressure  checked regularly, as often as told by your health care provider. Working with your health care provider  Review all the medicines you take with your health care provider because there may be side effects or interactions.  Talk with your health care provider about your diet, exercise habits, and other lifestyle factors that may be contributing to hypertension.  Visit your health care provider regularly. Your health care provider can help you create and adjust your plan for managing hypertension. Will I need medicine to control my blood pressure? Your health care provider may prescribe medicine if lifestyle changes are not enough to get your blood pressure under control, and if:  Your systolic blood pressure is 130 or higher.  Your diastolic blood pressure is 80 or higher. Take medicines only as told by your health care provider. Follow the directions carefully. Blood pressure medicines must be taken as prescribed. The medicine does not work as well when you skip doses. Skipping doses also puts you at risk for problems. Contact a health care provider if:  You think you are having a reaction to medicines you have taken.  You have repeated (recurrent) headaches.  You feel dizzy.  You have swelling in your ankles.  You have trouble with your vision. Get help right away if:  You develop a severe headache or confusion.  You have unusual weakness or numbness, or you feel faint.  You have severe pain in your chest or abdomen.  You vomit repeatedly.  You have trouble breathing. Summary  Hypertension is when the force of blood pumping through your arteries is too strong. If this condition is not controlled, it may put you at risk for serious complications.  Your personal target blood pressure may vary depending on your medical conditions, your age, and other factors. For most people, a normal blood pressure is less than 120/80.  Hypertension is managed by lifestyle changes,  medicines, or both. Lifestyle changes include weight loss, eating a healthy, low-sodium diet, exercising more, and limiting alcohol. This information is not intended to replace advice given to you by your health care provider. Make sure you discuss any questions you have with your health care provider. Document Revised: 08/27/2018 Document Reviewed: 04/02/2016 Elsevier Patient Education  [place].

## 2020-05-22 NOTE — Chronic Care Management (AMB) (Signed)
Chronic Care Management Pharmacy  Name: Kathy Dougherty  MRN: [id number] DOB: September 10, 1950  Chief Complaint/ HPI  Kathy Dougherty,  70 y.o. , female presents for their Follow-Up CCM visit with the clinical pharmacist In office.  PCP : Kathy Hess, MD  Their chronic conditions include: Hypertension, Hyperlipidemia, Diabetes, GERD, Osteoarthritis and Allergic Rhinitis  Office Visits: 04/18/20-Dr. [name] - Annual physical, blood work 01/10/20- Dr. [name] -  back muscle spasms-methocarbamol 500 mg qhs, Advil tid 12/08/19- Dr. [name] - A1c 8.7, labs, podiatry for toenail fungus, add mucinex for allergic rhinitis 08/05/19- Dr. [name] - mometasone nasal spray, noncompliance    Medications: Outpatient Encounter Medications as of 05/22/2020  Medication Sig  . acetaminophen (TYLENOL) 500 MG tablet Take 500 mg by mouth every 6 (six) hours as needed.  [name] amLODipine (NORVASC) 2.5 MG tablet Take 1 tablet (2.5 mg total) by mouth daily.  . blood glucose meter kit and supplies KIT Dispense based on patient and insurance preference. Test BS bid  . glimepiride (AMARYL) 4 MG tablet Take 1 tablet (4 mg total) by mouth daily before breakfast.  . glucose blood (ONE TOUCH ULTRA TEST) test strip E11.9  . hydrochlorothiazide (HYDRODIURIL) 25 MG tablet Take 1 tablet (25 mg total) by mouth daily.  [name] ibuprofen (ADVIL,MOTRIN) 600 MG tablet TAKE 1 TABLET (600 MG TOTAL) BY MOUTH EVERY 8 (EIGHT) HOURS AS NEEDED.  [name] Lancets (ONETOUCH ULTRASOFT) lancets Use as instructed  . levocetirizine (XYZAL) 5 MG tablet TAKE 1 TABLET BY MOUTH EVERY DAY IN THE EVENING  . losartan (COZAAR) 50 MG tablet TAKE 1 TABLET BY MOUTH EVERY DAY  . Magnesium 400 MG TABS Take 250 mg by mouth daily.  . Misc Natural Products (ELDERBERRY ZINC/VIT C/IMMUNE MT) Use as directed in the mouth or throat.  . mometasone (NASONEX) 50 MCG/ACT nasal spray Place 2 sprays into the nose daily.  [name] omeprazole (PRILOSEC) 40 MG capsule TAKE 1 CAPSULE BY  MOUTH EVERY DAY  . simvastatin (ZOCOR) 10 MG tablet TAKE 1 TABLET BY MOUTH EVERYDAY AT BEDTIME  . vitamin B-12 (CYANOCOBALAMIN) 500 MCG tablet Take 500 mcg by mouth daily.  [name] VITAMIN D, CHOLECALCIFEROL, PO Take 1,000 Units by mouth daily.  [name] docusate sodium (COLACE) 100 MG capsule Take 100 mg by mouth 2 (two) times daily.   No facility-administered encounter medications on file as of 05/22/2020.    Current Diagnosis/Assessment:    Goals Addressed            This Visit's Progress   . Pharmacy care plan       CARE PLAN ENTRY (see longitudinal plan of care for additional care plan information)  Current Barriers:  . Chronic Disease Management support, education, and care coordination needs related to Hypertension, Hyperlipidemia, Diabetes, GERD, Osteoarthritis, and Allergic Rhinitis   Hypertension BP Readings from Last 3 Encounters:  04/30/20 140/80  04/18/20 123/68  01/10/20 124/86   . Pharmacist Clinical Goal(s): o Over the next 60 days, patient will work with PharmD and providers to maintain BP goal <130/80 . Current regimen:  . Amlodipine 2.5 mg qd . HCTZ 25 mg qd . Losartan 50 mg qd . Interventions: .  Counseled on proper BP technique   . Disucussed changing administration times of BP meds to HS . Provided diet and exercise counseling. . Patient self care activities - Over the next 60 days, patient will: o Check BP daily document, and provide at future appointments o Ensure daily salt intake < 2300 mg/day  Hyperlipidemia Lab  Results  Component Value Date/Time   LDLCALC 47 04/18/2020 09:31 AM   . Pharmacist Clinical Goal(s): o Over the next 60 days, patient will work with PharmD and providers to maintain LDL goal < 70 . Current regimen:  o Simvastatin 10 mg qd . Interventions: o Provided diet and exercise counseling. . Patient self care activities - Over the next 60 days, patient will: o Increase exercise to goal of 30 min/cay 5 days/week   Diabetes Lab  Results  Component Value Date/Time   HGBA1C 7.9 (H) 04/18/2020 09:31 AM   HGBA1C 8.7 (H) 12/08/2019 08:55 AM   . Pharmacist Clinical Goal(s): o Over the next 60 days, patient will work with PharmD and providers to achieve A1c goal <7% . Current regimen:  o Glimepiride 4 mg ac breakfast . Interventions:  o Reviewed goal BG readings for an A1c of <7%, we want to see fasting sugars <130 and 2 hour after meal sugars <180.  o Provided diet and exercise counseling. o Discussed medicare requirements for CGM coverage and out of pocket cost. Patient is willing to pay for device and supplies. . Patient self care activities - Over the next 60 days, patient will: o Check blood sugar twice daily, document, and provide at future appointments o Contact provider with any episodes of hypoglycemia o Continue healthy diet and increase exercise to 30 min/ 5 days per week.  Medication management . Pharmacist Clinical Goal(s): o Over the next 60 days, patient will work with PharmD and providers to achieve optimal medication adherence . [facility] . Interventions o Comprehensive medication review performed. o Continue current medication management strategy . Patient self care activities - Over the next 60 days, patient will: o Focus on medication adherence by fill dates o Take medications as prescribed o Report any questions or concerns to PharmD and/or provider(s)  Please see past updates related to this goal by clicking on the "Past Updates" button in the selected goal          Diabetes   A1c goal <7%  Recent Relevant Labs: Lab Results  Component Value Date/Time   HGBA1C 7.9 (H) 04/18/2020 09:31 AM   HGBA1C 8.7 (H) 12/08/2019 08:55 AM    Last diabetic Eye exam:  Lab Results  Component Value Date/Time   HMDIABEYEEXA No Retinopathy 11/11/2019 12:00 AM    BMP Latest Ref Rng & Units 04/18/2020 12/08/2019 04/18/2019  Glucose 65 - 99 mg/dL 184(H) 198(H) 167(H)  BUN 8 - 27 mg/dL _0 Creatinine 0.57 - 1.00 mg/dL 0.70 0.76 0.80  BUN/Creat Ratio 12 - 28 11(L) 18 18  Sodium 134 - 144 mmol/L 140 140 139  Potassium 3.5 - 5.2 mmol/L 3.4(L) 3.6 3.8  Chloride 96 - 106 mmol/L 101 99 100  CO2 20 - 29 mmol/L _1 Calcium 8.7 - 10.3 mg/dL 9.4 9.7 9.1    Last diabetic Foot exam: No results found for: HMDIABFOOTEX   Checking BG: Twice weekly FBG 142-160 Post-prandials: <200  Patient has failed these meds in past:metfomin --GI effects Patient is currently uncontrolled on the following medications: . Glimepiride 4 mg ac breakfast  We discussed: Patient picked up new meter and supplies and has been checking BG regularly.  We reviewed signs and symptoms of hypoglycemia, patient denies. We discussed goal glucose readings for an A1c of <7%, we want to see fasting sugars <130 and 2 hour after meal sugars <180.  She eats a mostly healthy diet of lean protein  and vegetables. She is walking 3 days per week for 30 minutes and will strive to increase to 5 days per week. She has difficulty with finger sticks and reports having to squeeze the blood out. WE reviewed adjusting her lancet device and using her palm as the testing site. Patient is s interested in [facility] and willing to pay for sensors. I will collaborate with [employer] rep for any available discounts and PCP for order.   Plan  Continue current medications recheck A1C in 90 days. Recommend consider adding SGLT-2 if elevation continues.  Hypertension   BP goal is:  <130/80  Office blood pressures are  BP Readings from Last 3 Encounters:  04/30/20 140/80  04/18/20 123/68  01/10/20 124/86   BMP Latest Ref Rng & Units 04/18/2020 12/08/2019 04/18/2019  Glucose 65 - 99 mg/dL 184(H) 198(H) 167(H)  BUN 8 - 27 mg/dL _0 Creatinine 0.57 - 1.00 mg/dL 0.70 0.76 0.80  BUN/Creat Ratio 12 - 28 11(L) 18 18  Sodium 134 - 144 mmol/L 140 140 139  Potassium 3.5 - 5.2 mmol/L 3.4(L) 3.6 3.8  Chloride 96 - 106 mmol/L  101 99 100  CO2 20 - 29 mmol/L _1 Calcium 8.7 - 10.3 mg/dL 9.4 9.7 9.1    Patient checks BP at home: twice daily  Patient home BP readings are ranging:140/83 to 150s/80-90  Patient has failed these meds in the past: NA Patient is currently controlled on the following medications:  . Amlodipine 2.5 mg qd . HCTZ 25 mg qd . Losartan 50 mg qd  We discussed diet and exercise extensively. We discussed changing administration time of BP medications to bedtime as data shows better CV outcomes. Patient is walking three days per week for about 30 minutes. She eats a mostly healthy diet of lean protein and veggies. She drinks ~64 oz of water daily. She is checking her BP twice daily.  Plan  Continue current medications     Hyperlipidemia   LDL goal < 70  Lipid Panel     Component Value Date/Time   CHOL 101 04/18/2020 0931   TRIG 48 04/18/2020 0931   HDL 42 04/18/2020 0931   LDLCALC 47 04/18/2020 0931    Hepatic Function Latest Ref Rng & Units 04/18/2020 04/18/2019 08/11/2018  Total Protein 6.0 - 8.5 g/dL 7.3 7.3 7.2  Albumin 3.8 - 4.8 g/dL 4.1 4.3 4.2  AST 0 - 40 IU/L _2 ALT 0 - 32 IU/L _3 Alk Phosphatase 44 - 121 IU/L 79 85 83  Total Bilirubin 0.0 - 1.2 mg/dL 0.6 0.4 0.6     The ASCVD Risk score Mikey Bussing DC Jr., et al., 2013) failed to calculate for the following reasons:   The valid total cholesterol range is 130 to 320 mg/dL   Patient has failed these meds in past: NA Patient is currently controlled on the following medications:  . Simvastatin 10 mg qd  We discussed: Diet and exercise. Reviewed goal cholesterol levels.    Plan  Continue current medications  Osteopenia / Osteoporosis   Last DEXA Scan: 08/18/2017  T-Score femoral neck: -1.1  T-Score forearm radius: -1.6  10-year probability of major osteoporotic fracture: 3.5%  10-year probability of hip fracture: 0.3%  No results found for: VD25OH   Patient is not a candidate for pharmacologic  treatment  Patient has failed these meds in past: NA Patient is currently controlled on the following medication . Vitamin D  1000 units daily . Multivitamin qd  We discussed:  Recommend 1200 mg of calcium daily from dietary and supplemental sources. Recommend weight-bearing and muscle strengthening exercises for building and maintaining bone density.   Plan  Continue current medications. Recommend consider repeat DEXA scan and follow-up vitamin d level. Consider initiation of calcium supplementation.  Allergic Rhinitis   Patient has failed these meds in past: NA Patient is currently controlled on the following medications:  . Levocetirizine 5 mg qd . Mometasone nasal 2 sprays daily  We discussed:  Patient reports allergies pretty well unless she gets around triggers such as cats.  Plan  Continue current medications      Medication Management   [facility] Uses pill box? Yes Pt endorses 90 % compliance  We discussed: Discussed benefits of medication synchronization, packaging and delivery as well as enhanced pharmacist oversight with [employer].  Plan  Continue current medication management strategy. Patient enjoys getting out and going to pharmacy.    Follow up: 2 month phone visit  Junita Push. Kenton Kingfisher PharmD, [facility] [phone]

## 2020-05-28 ENCOUNTER — Encounter: Payer: Self-pay | Admitting: *Deleted

## 2020-05-28 ENCOUNTER — Encounter: Payer: Medicare HMO | Attending: Internal Medicine | Admitting: *Deleted

## 2020-05-28 ENCOUNTER — Other Ambulatory Visit: Payer: Self-pay

## 2020-05-28 VITALS — BP 130/84 | Ht 62.0 in | Wt 170.7 lb

## 2020-05-28 DIAGNOSIS — E119 Type 2 diabetes mellitus without complications: Secondary | ICD-10-CM

## 2020-05-28 DIAGNOSIS — E118 Type 2 diabetes mellitus with unspecified complications: Secondary | ICD-10-CM | POA: Diagnosis not present

## 2020-05-28 NOTE — Progress Notes (Signed)
Diabetes Self-Management Education  Visit Type: First/Initial  Appt. Start Time: 1335 Appt. End Time: 4098  05/28/2020  [name], identified by name and date of birth, is a 70 y.o. female with a diagnosis of Diabetes: Type 2.   ASSESSMENT  Blood pressure 130/84, height 5\' 2"  (1.575 m), weight 170 lb 11.2 oz (77.4 kg). Body mass index is 31.22 kg/m.   Diabetes Self-Management Education - 05/28/20 1523      Visit Information   Visit Type First/Initial      Initial Visit   Diabetes Type Type 2    Are you currently following a meal plan? No    Are you taking your medications as prescribed? Yes    Date Diagnosed 5-6 years ago      Health Coping   How would you rate your overall health? Good      Psychosocial Assessment   Patient Belief/Attitude about Diabetes Motivated to manage diabetes   "it's here, do something about it"   Self-care barriers None    Self-management support Doctor's office;Family    Patient Concerns Nutrition/Meal planning;Glycemic Control;Medication;Monitoring;Healthy Lifestyle    Special Needs None    Preferred Learning Style Auditory;Visual    Learning Readiness Ready    How often do you need to have someone help you when you read instructions, pamphlets, or other written materials from your doctor or pharmacy? 1 - Never      Pre-Education Assessment   Patient understands the diabetes disease and treatment process. Needs Review    Patient understands incorporating nutritional management into lifestyle. Needs Instruction    Patient undertands incorporating physical activity into lifestyle. Needs Instruction    Patient understands using medications safely. Needs Review    Patient understands monitoring blood glucose, interpreting and using results Needs Review    Patient understands prevention, detection, and treatment of acute complications. Needs Instruction    Patient understands prevention, detection, and treatment of chronic complications.  Needs Review    Patient understands how to develop strategies to address psychosocial issues. Needs Review    Patient understands how to develop strategies to promote health/change behavior. Needs Review      Complications   Last HgB A1C per patient/outside source 7.9 %   04/18/2020   How often do you check your blood sugar? 3-4 times / week    Fasting Blood glucose range (mg/dL) 70-129;130-179;180-200;>200   FBG's in the last month have range from 129-258 mg/dL.   Have you had a dilated eye exam in the past 12 months? Yes    Have you had a dental exam in the past 12 months? No   no dental insurance   Are you checking your feet? Yes    How many days per week are you checking your feet? 4      Dietary Intake   Breakfast sometimes skips or has cheerios and milk; 2 eggs, bacon, oatmeal or grits; grits    Lunch [place] and cheese sandwich, soup, burger and fries    Dinner chicken, beef, occasional steak, pork chops, occasional fish; potatoes, corn, peas, beans, broccoli, green beans, pasta, brussel sprouts, greens, squash, asparagus, carrots, beets, cabbage, salads with tomatoes and cuccumbers    Snack (evening) pecans, popcorn, gummies    Beverage(s) water, occasional sweet tea      Exercise   Exercise Type ADL's      Patient Education   Previous Diabetes Education Yes (please comment)   came to classes here in 2015-2016   Disease  state  Explored patient's options for treatment of their diabetes    Nutrition management  Role of diet in the treatment of diabetes and the relationship between the three main macronutrients and blood glucose level;Food label reading, portion sizes and measuring food.;Reviewed blood glucose goals for pre and post meals and how to evaluate the patients' food intake on their blood glucose level.    Physical activity and exercise  Role of exercise on diabetes management, blood pressure control and cardiac health.    Medications Reviewed patients medication for  diabetes, action, purpose, timing of dose and side effects.    Monitoring Purpose and frequency of SMBG.;Taught/discussed recording of test results and interpretation of SMBG.;Identified appropriate SMBG and/or A1C goals.    Chronic complications Relationship between chronic complications and blood glucose control    Psychosocial adjustment Identified and addressed patients feelings and concerns about diabetes      Individualized Goals (developed by patient)   Reducing Risk Other (comment)   improve blood sugars, decrease medications, prevent diabetes complications, lead a healthier lifestyle     Outcomes   Expected Outcomes Demonstrated interest in learning. Expect positive outcomes    Program Status Not Completed           Individualized Plan for Diabetes Self-Management Training:   Learning Objective:  Patient will have a greater understanding of diabetes self-management. Patient education plan is to attend individual and/or group sessions per assessed needs and concerns.   Plan:   Patient Instructions  Check blood sugars 1 x day before breakfast or 2 hrs after supper every day Bring blood sugar records or meter to the next MD appointment  Exercise:  Begin walking for  15  minutes 3 days a week and gradually increase to 30 minutes 5 x week  Eat 3 meals day,1-2 snacks a day Space meals 4-6 hours apart Don't skip meals Avoid sugar sweetened drinks (tea)  Call back if you want to schedule an appointment with the dietitian or nurse  Expected Outcomes:  Demonstrated interest in learning. Expect positive outcomes  Education material provided: General Meal Planning Guidelines Simple Meal Plan  If problems or questions, patient to contact team via:  Johny Drilling, RN, CCM, [place] [phone]  Future DSME appointment:  PRN - she wants to work on changes and call back if she needs another appointment with the nurse or dietitian

## 2020-05-28 NOTE — Patient Instructions (Signed)
Check blood sugars 1 x day before breakfast or 2 hrs after supper every day Bring blood sugar records or meter to the next MD appointment  Exercise:  Begin walking for  15  minutes 3 days a week and gradually increase to 30 minutes 5 x week  Eat 3 meals day,1-2 snacks a day Space meals 4-6 hours apart Don't skip meals Avoid sugar sweetened drinks (tea)  Call back if you want to schedule an appointment with the dietitian or nurse

## 2020-06-03 ENCOUNTER — Other Ambulatory Visit: Payer: Self-pay | Admitting: Internal Medicine

## 2020-06-03 DIAGNOSIS — E118 Type 2 diabetes mellitus with unspecified complications: Secondary | ICD-10-CM

## 2020-07-05 ENCOUNTER — Telehealth: Payer: Self-pay | Admitting: Pharmacist

## 2020-07-10 NOTE — Progress Notes (Addendum)
  Chronic Care Management Pharmacy Assistant   Name: Kathy Dougherty  MRN: [id number] DOB: 03/23/1951  Reason for Encounter: Disease State/ Diabetes   Patient Questions:  1.  Have you seen any other providers since your last visit? No  2.  Any changes in your medicines or health? No    PCP : Berglund, Laura H, MD  Allergies:   Allergies  Allergen Reactions   Ace Inhibitors Cough   Metformin And Related Diarrhea   Penicillins Rash   Sulfa Antibiotics Rash    Medications: Outpatient Encounter Medications as of 07/05/2020  Medication Sig   acetaminophen (TYLENOL) 500 MG tablet Take 500 mg by mouth every 6 (six) hours as needed.   amLODipine (NORVASC) 2.5 MG tablet Take 1 tablet (2.5 mg total) by mouth daily.   blood glucose meter kit and supplies KIT Dispense based on patient and insurance preference. Test BS bid   docusate sodium (COLACE) 100 MG capsule Take 100 mg by mouth 2 (two) times daily as needed.   glimepiride (AMARYL) 4 MG tablet TAKE 1 TABLET (4 MG TOTAL) BY MOUTH DAILY BEFORE BREAKFAST.   glucose blood (ONE TOUCH ULTRA TEST) test strip E11.9   hydrochlorothiazide (HYDRODIURIL) 25 MG tablet Take 1 tablet (25 mg total) by mouth daily.   ibuprofen (ADVIL,MOTRIN) 600 MG tablet TAKE 1 TABLET (600 MG TOTAL) BY MOUTH EVERY 8 (EIGHT) HOURS AS NEEDED.   Lancets (ONETOUCH ULTRASOFT) lancets Use as instructed   levocetirizine (XYZAL) 5 MG tablet TAKE 1 TABLET BY MOUTH EVERY DAY IN THE EVENING   losartan (COZAAR) 50 MG tablet TAKE 1 TABLET BY MOUTH EVERY DAY   Magnesium 400 MG TABS Take 250 mg by mouth daily.   Misc Natural Products (ELDERBERRY ZINC/VIT C/IMMUNE MT) Use as directed in the mouth or throat.   mometasone (NASONEX) 50 MCG/ACT nasal spray Place 2 sprays into the nose daily.   omeprazole (PRILOSEC) 40 MG capsule TAKE 1 CAPSULE BY MOUTH EVERY DAY   simvastatin (ZOCOR) 10 MG tablet TAKE 1 TABLET BY MOUTH EVERYDAY AT BEDTIME   vitamin B-12 (CYANOCOBALAMIN) 500 MCG  tablet Take 500 mcg by mouth daily.   VITAMIN D, CHOLECALCIFEROL, PO Take 1,000 Units by mouth daily.   No facility-administered encounter medications on file as of 07/05/2020.    Current Diagnosis: 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   Mass of chest wall, left 04/18/2020   Keratoderma of foot, acquired 08/05/2019   Hyperlipidemia associated with type 2 diabetes mellitus (HCC) 03/26/2017   Iritis of left eye 03/26/2017   Pre-ulcerative calluses 03/26/2017   Arthritis of right knee 01/15/2016   DM (diabetes mellitus), type 2 with complications (HCC) 03/15/2015   Abnormal LFTs 03/15/2015   Essential (primary) hypertension 03/15/2015   Gastro-esophageal reflux disease without esophagitis 03/15/2015   Allergic rhinitis, seasonal 03/15/2015    Recent Relevant Labs: Lab Results  Component Value Date/Time   HGBA1C 7.9 (H) 04/18/2020 09:31 AM   HGBA1C 8.7 (H) 12/08/2019 08:55 AM    Kidney Function Lab Results  Component Value Date/Time   CREATININE 0.70 04/18/2020 09:31 AM   CREATININE 0.76 12/08/2019 08:55 AM   GFRNONAA 89 04/18/2020 09:31 AM   GFRAA 102 04/18/2020 09:31 AM    Current antihyperglycemic regimen:  Amaryl 4 mg: Take 1 tablet daily.  Check blood sugars twice daily.  What recent interventions/DTPs have been made to improve glycemic control:  The patient reported she has been watching   her diet and portion sizes.  Have there been any recent hospitalizations or ED visits since last visit with CPP? No   Patient denies hypoglycemic symptoms, including Pale, Sweaty, Shaky, Hungry, Nervous/irritable and Vision changes.  Patient denies hyperglycemic symptoms, including blurry vision, excessive thirst, fatigue, polyuria and weakness   How often are you checking your blood sugar? twice daily What are your blood sugars ranging?  120-180  During the week, how often does your blood glucose drop below 70?  Never   Adherence Review: Is the patient currently on a STATIN medication? Yes Is the patient currently on ACE/ARB medication? Yes Does the patient have >5 day gap between last estimated fill dates? Yes   The patient was pleasant and cooperative throughout phone call. She explained she continues to check her blood sugars twice daily. No reports of any blood sugars 70 and below. No reports of any blood sugars above 200.  Patient reported no acute needs related to medications.   Amanda Caudle, LPN Clinical Pharmacist Assistant  [phone]     Follow-Up:  Pharmacist Review  I have reviewed the care management and care coordination activities outlined in this encounter and I am certifying that I agree with the content of this note.  Julie S. Harris PharmD, BCPS Clinical Pharmacist [facility] [phone]  

## 2020-07-13 ENCOUNTER — Other Ambulatory Visit: Payer: Self-pay | Admitting: Internal Medicine

## 2020-07-13 DIAGNOSIS — I10 Essential (primary) hypertension: Secondary | ICD-10-CM

## 2020-07-22 ENCOUNTER — Other Ambulatory Visit: Payer: Self-pay | Admitting: Internal Medicine

## 2020-07-22 DIAGNOSIS — K219 Gastro-esophageal reflux disease without esophagitis: Secondary | ICD-10-CM

## 2020-07-22 NOTE — Telephone Encounter (Signed)
Requested Prescriptions  Pending Prescriptions Disp Refills  . omeprazole (PRILOSEC) 40 MG capsule [Pharmacy Med Name: OMEPRAZOLE DR 40 MG CAPSULE] 90 capsule 3    Sig: TAKE 1 CAPSULE BY MOUTH EVERY DAY     Gastroenterology: Proton Pump Inhibitors Passed - 07/22/2020  9:17 AM      Passed - Valid encounter within last 12 months    Recent Outpatient Visits          3 months ago Annual physical exam   [name], MD   6 months ago Back muscle spasm   [facility] [name], MD   7 months ago DM (diabetes mellitus), type 2 with complications [facility])   [facility] [name], MD   8 months ago Slow transit constipation   [facility] [name], MD   11 months ago DM (diabetes mellitus), type 2 with complications [facility])   [place], Laura H, MD      Future Appointments            In 4 weeks [name] Jesse Sans, MD [facility], [place]   In 9 months [name], Jesse Sans, MD [facility], [facility]

## 2020-07-30 ENCOUNTER — Ambulatory Visit (INDEPENDENT_AMBULATORY_CARE_PROVIDER_SITE_OTHER): Payer: Medicare HMO | Admitting: Internal Medicine

## 2020-07-30 ENCOUNTER — Encounter: Payer: Self-pay | Admitting: Internal Medicine

## 2020-07-30 ENCOUNTER — Other Ambulatory Visit: Payer: Self-pay

## 2020-07-30 VITALS — BP 130/76 | HR 77 | Temp 98.0°F | Ht 62.0 in | Wt 170.0 lb

## 2020-07-30 DIAGNOSIS — R0981 Nasal congestion: Secondary | ICD-10-CM | POA: Diagnosis not present

## 2020-07-30 DIAGNOSIS — J0100 Acute maxillary sinusitis, unspecified: Secondary | ICD-10-CM | POA: Diagnosis not present

## 2020-07-30 MED ORDER — MOMETASONE FUROATE 50 MCG/ACT NA SUSP: 2.0000 | Freq: Every day | NASAL | 5 refills | Status: DC

## 2020-07-30 MED ORDER — DOXYCYCLINE HYCLATE 100 MG PO TABS: 100.0000 mg | ORAL_TABLET | Freq: Two times a day (BID) | ORAL | 0 refills | Status: AC

## 2020-07-30 NOTE — Progress Notes (Signed)
Date:  07/30/2020   Name:  Kathy Dougherty   DOB:  04-22-1951   MRN:  [id number]   Chief Complaint: Sinusitis (X 4-5 days, allergy medication didn't help, No fever, light headed, dizzness, pressure behind eyes and cheek area )  Sinusitis This is a new problem. The current episode started in the past 7 days. There has been no fever. The pain is mild. Associated symptoms include congestion, headaches and sinus pressure. Pertinent negatives include no chills, coughing, shortness of breath or sore throat. Treatments tried: allergy medication. The treatment provided no relief.    Lab Results  Component Value Date   CREATININE 0.70 04/18/2020   BUN 8 04/18/2020   NA 140 04/18/2020   K 3.4 (L) 04/18/2020   CL 101 04/18/2020   CO2 22 04/18/2020   Lab Results  Component Value Date   CHOL 101 04/18/2020   HDL 42 04/18/2020   LDLCALC 47 04/18/2020   TRIG 48 04/18/2020   CHOLHDL 2.4 04/18/2020   Lab Results  Component Value Date   TSH 1.790 04/18/2020   Lab Results  Component Value Date   HGBA1C 7.9 (H) 04/18/2020   Lab Results  Component Value Date   WBC 6.0 04/18/2020   HGB 12.5 04/18/2020   HCT 37.0 04/18/2020   MCV 84 04/18/2020   PLT 221 04/18/2020   Lab Results  Component Value Date   ALT 20 04/18/2020   AST 25 04/18/2020   ALKPHOS 79 04/18/2020   BILITOT 0.6 04/18/2020     Review of Systems  Constitutional: Negative for chills, fatigue and fever.  HENT: Positive for congestion and sinus pressure. Negative for postnasal drip, sore throat and trouble swallowing.   Eyes: Negative for visual disturbance.  Respiratory: Negative for cough, chest tightness and shortness of breath.   Cardiovascular: Negative for chest pain.  Gastrointestinal: Negative for nausea and vomiting.  Neurological: Positive for light-headedness and headaches. Negative for dizziness and syncope.    Patient Active Problem List   Diagnosis Date Noted  . 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  .  Mass of chest wall, left 04/18/2020  . 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 Hyperlipidemia associated with type 2 diabetes mellitus ([place]) 03/26/2017  . 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 Pre-ulcerative calluses 03/26/2017  . Arthritis of right knee 01/15/2016  . DM (diabetes mellitus), type 2 with complications ([place]) 27/51/7001  . Abnormal LFTs 03/15/2015  . Essential (primary) hypertension 03/15/2015  . Gastro-esophageal reflux disease without esophagitis 03/15/2015  . Allergic rhinitis, seasonal 03/15/2015    Allergies  Allergen Reactions  . Ace Inhibitors Cough  . Metformin And Related Diarrhea  . Penicillins Rash  . Sulfa Antibiotics Rash    Past Surgical History:  Procedure Laterality Date  . CATARACT EXTRACTION Right 08/2014  . CATARACT EXTRACTION Left 04/2016  . HAMMER TOE SURGERY    . PARTIAL HYSTERECTOMY     fibroids    Social History   Tobacco Use  . Smoking status: Never Smoker  . Smokeless tobacco: Never Used  Vaping Use  . Vaping Use: Never used  Substance Use Topics  . Alcohol use: No    Alcohol/week: 0.0 standard drinks  . Drug use: No     Medication list has been reviewed and updated.  Current Meds  Medication Sig  . acetaminophen (TYLENOL) 500 MG tablet Take 500 mg by mouth every 6 (six) hours as needed.  [name] amLODipine (NORVASC) 2.5 MG tablet Take 1 tablet (2.5 mg  total) by mouth daily.  . blood glucose meter kit and supplies KIT Dispense based on patient and insurance preference. Test BS bid  . docusate sodium (COLACE) 100 MG capsule Take 100 mg by mouth 2 (two) times daily as needed.  [name] glimepiride (AMARYL) 4 MG tablet TAKE 1 TABLET (4 MG TOTAL) BY MOUTH DAILY BEFORE BREAKFAST.  [name] glucose blood (ONE TOUCH ULTRA TEST) test strip E11.9  . hydrochlorothiazide (HYDRODIURIL) 25 MG tablet Take 1 tablet (25 mg total) by mouth daily.  [name] ibuprofen (ADVIL,MOTRIN) 600 MG tablet TAKE 1 TABLET (600 MG TOTAL) BY MOUTH EVERY 8 (EIGHT) HOURS AS NEEDED.   [name] Lancets (ONETOUCH ULTRASOFT) lancets Use as instructed  . levocetirizine (XYZAL) 5 MG tablet TAKE 1 TABLET BY MOUTH EVERY DAY IN THE EVENING  . losartan (COZAAR) 50 MG tablet TAKE 1 TABLET BY MOUTH EVERY DAY  . mometasone (NASONEX) 50 MCG/ACT nasal spray Place 2 sprays into the nose daily.  [name] omeprazole (PRILOSEC) 40 MG capsule TAKE 1 CAPSULE BY MOUTH EVERY DAY  . simvastatin (ZOCOR) 10 MG tablet TAKE 1 TABLET BY MOUTH EVERYDAY AT BEDTIME    PHQ 2/9 Scores 07/30/2020 05/28/2020 04/30/2020 04/18/2020  PHQ - 2 Score 0 0 0 0  PHQ- 9 Score 2 - 3 0    GAD 7 : Generalized Anxiety Score 07/30/2020 04/18/2020 12/08/2019 11/17/2019  Nervous, Anxious, on Edge 0 0 0 0  Control/stop worrying 0 0 0 0  Worry too much - different things 0 0 0 0  Trouble relaxing 0 0 1 0  Restless 0 0 0 0  Easily annoyed or irritable 0 0 0 0  Afraid - awful might happen 0 0 0 0  Total GAD 7 Score 0 0 1 0  Anxiety Difficulty - - Not difficult at all Not difficult at all    BP Readings from Last 3 Encounters:  07/30/20 130/76  05/28/20 130/84  04/30/20 140/80    Physical Exam Constitutional:      Appearance: She is well-developed.  HENT:     Right Ear: Ear canal and external ear normal. Tympanic membrane is not erythematous or retracted.     Left Ear: Ear canal and external ear normal. Tympanic membrane is not erythematous or retracted.     Nose:     Right Sinus: Maxillary sinus tenderness and frontal sinus tenderness present.     Left Sinus: Maxillary sinus tenderness and frontal sinus tenderness present.     Mouth/Throat:     Mouth: No oral lesions.     Pharynx: Uvula midline. Posterior oropharyngeal erythema present. No oropharyngeal exudate.  Cardiovascular:     Rate and Rhythm: Normal rate and regular rhythm.     Heart sounds: Normal heart sounds.  Pulmonary:     Breath sounds: Normal breath sounds. No wheezing or rales.  Lymphadenopathy:     Cervical: No cervical adenopathy.  Neurological:      Mental Status: She is alert and oriented to person, place, and time.     Wt Readings from Last 3 Encounters:  07/30/20 170 lb (77.1 kg)  05/28/20 170 lb 11.2 oz (77.4 kg)  04/30/20 169 lb 6.4 oz (76.8 kg)    BP 130/76   Pulse 77   Temp 98 F (36.7 C) (Oral)   Ht $R'5\' 2"'ik$  (1.575 m)   Wt 170 lb (77.1 kg)   SpO2 96%   BMI 31.09 kg/m   Assessment and Plan: 1. Nasal sinus congestion Stop Xyzal and start allegra  instead - mometasone (NASONEX) 50 MCG/ACT nasal spray; Place 2 sprays into the nose daily.  Dispense: 17 g; Refill: 5  2. Acute non-recurrent maxillary sinusitis Resume Nasonex, continue antihistamine - doxycycline (VIBRA-TABS) 100 MG tablet; Take 1 tablet (100 mg total) by mouth 2 (two) times daily for 10 days.  Dispense: 20 tablet; Refill: 0   Partially dictated using Editor, commissioning. Any errors are unintentional.  [name], MD [facility]  07/30/2020

## 2020-07-30 NOTE — Patient Instructions (Addendum)
Allegra 180 mg once a day  Resume Nasonex nasal spray daily  Take fluid pill and glimepiride in the AM - all other medications can be taken at bedtime

## 2020-08-02 DIAGNOSIS — E119 Type 2 diabetes mellitus without complications: Secondary | ICD-10-CM | POA: Diagnosis not present

## 2020-08-02 DIAGNOSIS — L255 Unspecified contact dermatitis due to plants, except food: Secondary | ICD-10-CM | POA: Diagnosis not present

## 2020-08-02 DIAGNOSIS — M79644 Pain in right finger(s): Secondary | ICD-10-CM | POA: Diagnosis not present

## 2020-08-02 DIAGNOSIS — M79645 Pain in left finger(s): Secondary | ICD-10-CM | POA: Diagnosis not present

## 2020-08-02 DIAGNOSIS — E785 Hyperlipidemia, unspecified: Secondary | ICD-10-CM | POA: Diagnosis not present

## 2020-08-02 DIAGNOSIS — M7989 Other specified soft tissue disorders: Secondary | ICD-10-CM | POA: Diagnosis not present

## 2020-08-02 DIAGNOSIS — L237 Allergic contact dermatitis due to plants, except food: Secondary | ICD-10-CM | POA: Diagnosis not present

## 2020-08-02 DIAGNOSIS — L299 Pruritus, unspecified: Secondary | ICD-10-CM | POA: Diagnosis not present

## 2020-08-02 DIAGNOSIS — R2231 Localized swelling, mass and lump, right upper limb: Secondary | ICD-10-CM | POA: Diagnosis not present

## 2020-08-02 DIAGNOSIS — K219 Gastro-esophageal reflux disease without esophagitis: Secondary | ICD-10-CM | POA: Diagnosis not present

## 2020-08-02 DIAGNOSIS — I10 Essential (primary) hypertension: Secondary | ICD-10-CM | POA: Diagnosis not present

## 2020-08-02 DIAGNOSIS — Z882 Allergy status to sulfonamides status: Secondary | ICD-10-CM | POA: Diagnosis not present

## 2020-08-03 ENCOUNTER — Ambulatory Visit (INDEPENDENT_AMBULATORY_CARE_PROVIDER_SITE_OTHER): Payer: Medicare HMO | Admitting: Internal Medicine

## 2020-08-03 ENCOUNTER — Other Ambulatory Visit: Payer: Self-pay

## 2020-08-03 ENCOUNTER — Encounter: Payer: Self-pay | Admitting: Internal Medicine

## 2020-08-03 VITALS — BP 138/68 | HR 82 | Temp 97.8°F | Ht 62.0 in | Wt 171.0 lb

## 2020-08-03 DIAGNOSIS — L03012 Cellulitis of left finger: Secondary | ICD-10-CM

## 2020-08-03 MED ORDER — PREDNISONE 10 MG PO TABS
10.0000 mg | ORAL_TABLET | ORAL | 0 refills | Status: AC
Start: 2020-08-03 — End: 2020-08-09

## 2020-08-03 NOTE — Progress Notes (Signed)
Date:  08/03/2020   Name:  Kathy Dougherty   DOB:  1950-11-10   MRN:  [id number]   Chief Complaint: Edema (X1 week, left Pinky finger, itching, redness, getting worse, painful, went to [facility]. Yesterday gave pt cream )  Rash This is a new problem. The current episode started in the past 7 days. The problem is unchanged. The affected locations include the left hand. The rash is characterized by blistering, itchiness and redness. She was exposed to nothing (may have touched poison ivy). Associated symptoms include congestion (improved). Pertinent negatives include no fatigue, fever or shortness of breath. Past treatments include topical steroids. The treatment provided mild relief.  She is also taking Doxycycline for a sinus infection - has about 5 more days.  Lab Results  Component Value Date   CREATININE 0.70 04/18/2020   BUN 8 04/18/2020   NA 140 04/18/2020   K 3.4 (L) 04/18/2020   CL 101 04/18/2020   CO2 22 04/18/2020   Lab Results  Component Value Date   CHOL 101 04/18/2020   HDL 42 04/18/2020   LDLCALC 47 04/18/2020   TRIG 48 04/18/2020   CHOLHDL 2.4 04/18/2020   Lab Results  Component Value Date   TSH 1.790 04/18/2020   Lab Results  Component Value Date   HGBA1C 7.9 (H) 04/18/2020   Lab Results  Component Value Date   WBC 6.0 04/18/2020   HGB 12.5 04/18/2020   HCT 37.0 04/18/2020   MCV 84 04/18/2020   PLT 221 04/18/2020   Lab Results  Component Value Date   ALT 20 04/18/2020   AST 25 04/18/2020   ALKPHOS 79 04/18/2020   BILITOT 0.6 04/18/2020     Review of Systems  Constitutional: Negative for chills, fatigue and fever.  HENT: Positive for congestion (improved).   Respiratory: Negative for chest tightness and shortness of breath.   Cardiovascular: Negative for chest pain.  Musculoskeletal: Positive for arthralgias (finger swollen and tight).  Skin: Positive for rash.    Patient Active Problem List   Diagnosis Date Noted  . Deafness in right  ear 04/18/2020  . Mass of chest wall, left 04/18/2020  . 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 Hyperlipidemia associated with type 2 diabetes mellitus ([place]) 03/26/2017  . 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 Pre-ulcerative calluses 03/26/2017  . Arthritis of right knee 01/15/2016  . DM (diabetes mellitus), type 2 with complications ([place]) 28/78/6767  . Abnormal LFTs 03/15/2015  . Essential (primary) hypertension 03/15/2015  . Gastro-esophageal reflux disease without esophagitis 03/15/2015  . Allergic rhinitis, seasonal 03/15/2015    Allergies  Allergen Reactions  . Ace Inhibitors Cough  . Metformin And Related Diarrhea  . Penicillins Rash  . Sulfa Antibiotics Rash    Past Surgical History:  Procedure Laterality Date  . CATARACT EXTRACTION Right 08/2014  . CATARACT EXTRACTION Left 04/2016  . HAMMER TOE SURGERY    . PARTIAL HYSTERECTOMY     fibroids    Social History   Tobacco Use  . Smoking status: Never Smoker  . Smokeless tobacco: Never Used  Vaping Use  . Vaping Use: Never used  Substance Use Topics  . Alcohol use: No    Alcohol/week: 0.0 standard drinks  . Drug use: No     Medication list has been reviewed and updated.  Current Meds  Medication Sig  . acetaminophen (TYLENOL) 500 MG tablet Take 500 mg by mouth every 6 (six) hours as needed.  [name] amLODipine (NORVASC)  2.5 MG tablet Take 1 tablet (2.5 mg total) by mouth daily.  . blood glucose meter kit and supplies KIT Dispense based on patient and insurance preference. Test BS bid  . docusate sodium (COLACE) 100 MG capsule Take 100 mg by mouth 2 (two) times daily as needed.  . doxycycline (VIBRA-TABS) 100 MG tablet Take 1 tablet (100 mg total) by mouth 2 (two) times daily for 10 days.  [name] glimepiride (AMARYL) 4 MG tablet TAKE 1 TABLET (4 MG TOTAL) BY MOUTH DAILY BEFORE BREAKFAST.  [name] glucose blood (ONE TOUCH ULTRA TEST) test strip E11.9  . hydrochlorothiazide (HYDRODIURIL) 25 MG tablet Take 1 tablet (25 mg total)  by mouth daily.  [name] ibuprofen (ADVIL,MOTRIN) 600 MG tablet TAKE 1 TABLET (600 MG TOTAL) BY MOUTH EVERY 8 (EIGHT) HOURS AS NEEDED.  [name] Lancets (ONETOUCH ULTRASOFT) lancets Use as instructed  . levocetirizine (XYZAL) 5 MG tablet TAKE 1 TABLET BY MOUTH EVERY DAY IN THE EVENING  . losartan (COZAAR) 50 MG tablet TAKE 1 TABLET BY MOUTH EVERY DAY  . mometasone (NASONEX) 50 MCG/ACT nasal spray Place 2 sprays into the nose daily.  [name] omeprazole (PRILOSEC) 40 MG capsule TAKE 1 CAPSULE BY MOUTH EVERY DAY  . simvastatin (ZOCOR) 10 MG tablet TAKE 1 TABLET BY MOUTH EVERYDAY AT BEDTIME  . triamcinolone (KENALOG) 0.1 % Apply topically.    PHQ 2/9 Scores 08/03/2020 07/30/2020 05/28/2020 04/30/2020  PHQ - 2 Score 0 0 0 0  PHQ- 9 Score 0 2 - 3    GAD 7 : Generalized Anxiety Score 08/03/2020 07/30/2020 04/18/2020 12/08/2019  Nervous, Anxious, on Edge 0 0 0 0  Control/stop worrying 0 0 0 0  Worry too much - different things 0 0 0 0  Trouble relaxing 0 0 0 1  Restless 0 0 0 0  Easily annoyed or irritable 0 0 0 0  Afraid - awful might happen 0 0 0 0  Total GAD 7 Score 0 0 0 1  Anxiety Difficulty - - - Not difficult at all    BP Readings from Last 3 Encounters:  08/03/20 138/68  07/30/20 130/76  05/28/20 130/84    Physical Exam Vitals and nursing note reviewed.  Constitutional:      General: She is not in acute distress.    Appearance: She is well-developed.  HENT:     Head: Normocephalic and atraumatic.  Pulmonary:     Effort: Pulmonary effort is normal. No respiratory distress.  Skin:    General: Skin is warm and dry.     Findings: No rash.     Comments: Redness and swelling fifth finger on left.  Apparent flat vesicle over the middle phalanx Mild redness of the fifth finger on right - no vesicle  Neurological:     Mental Status: She is alert and oriented to person, place, and time.  Psychiatric:        Mood and Affect: Mood normal.        Behavior: Behavior normal.     Wt Readings from Last  3 Encounters:  08/03/20 171 lb (77.6 kg)  07/30/20 170 lb (77.1 kg)  05/28/20 170 lb 11.2 oz (77.4 kg)    BP 138/68   Pulse 82   Temp 97.8 F (36.6 C) (Oral)   Ht _0  (1.575 m)   Wt 171 lb (77.6 kg)   SpO2 97%   BMI 31.28 kg/m   Assessment and Plan: 1. Cellulitis of finger of left hand Finish Doxycycline, continue topical  cortisone Will give steroid taper to help with inflammation and swelling Call next week if not significantly improved - predniSONE (DELTASONE) 10 MG tablet; Take 1 tablet (10 mg total) by mouth as directed for 6 days. Take 6,5,4,3,2,1 then stop  Dispense: 21 tablet; Refill: 0   Partially dictated using Editor, commissioning. Any errors are unintentional.  [name], MD [facility]  08/03/2020

## 2020-08-20 ENCOUNTER — Telehealth: Payer: Self-pay

## 2020-08-20 ENCOUNTER — Ambulatory Visit (INDEPENDENT_AMBULATORY_CARE_PROVIDER_SITE_OTHER): Payer: Medicare HMO | Admitting: Internal Medicine

## 2020-08-20 ENCOUNTER — Other Ambulatory Visit: Payer: Self-pay

## 2020-08-20 ENCOUNTER — Encounter: Payer: Self-pay | Admitting: Internal Medicine

## 2020-08-20 VITALS — BP 136/80 | HR 75 | Temp 97.9°F | Ht 62.0 in | Wt 174.0 lb

## 2020-08-20 DIAGNOSIS — E118 Type 2 diabetes mellitus with unspecified complications: Secondary | ICD-10-CM | POA: Diagnosis not present

## 2020-08-20 DIAGNOSIS — I10 Essential (primary) hypertension: Secondary | ICD-10-CM

## 2020-08-20 LAB — POCT GLYCOSYLATED HEMOGLOBIN (HGB A1C): Hemoglobin A1C: 7.9 % — AB (ref 4.0–5.6)

## 2020-08-20 MED ORDER — DAPAGLIFLOZIN PROPANEDIOL 5 MG PO TABS: 5.0000 mg | ORAL_TABLET | Freq: Every day | ORAL | 1 refills | Status: DC

## 2020-08-20 NOTE — Progress Notes (Signed)
Date:  08/20/2020   Name:  Kathy Dougherty   DOB:  1951-05-06   MRN:  [id number]   Chief Complaint: Diabetes (147 blood sugar Friday )  Diabetes She presents for her follow-up diabetic visit. She has type 2 diabetes mellitus. Her disease course has been stable. Pertinent negatives for hypoglycemia include no headaches or tremors. Pertinent negatives for diabetes include no chest pain, no fatigue, no polydipsia and no polyuria. Current diabetic treatment includes oral agent (monotherapy) (glimepiride). She is compliant with treatment all of the time. Her home blood glucose trend is decreasing steadily. An ACE inhibitor/angiotensin II receptor blocker is being taken.  Hypertension This is a chronic problem. The problem is controlled. Pertinent negatives include no chest pain, headaches, palpitations or shortness of breath. Past treatments include angiotensin blockers, diuretics and calcium channel blockers. The current treatment provides significant improvement. There are no compliance problems.     Lab Results  Component Value Date   CREATININE 0.70 04/18/2020   BUN 8 04/18/2020   NA 140 04/18/2020   K 3.4 (L) 04/18/2020   CL 101 04/18/2020   CO2 22 04/18/2020   Lab Results  Component Value Date   CHOL 101 04/18/2020   HDL 42 04/18/2020   LDLCALC 47 04/18/2020   TRIG 48 04/18/2020   CHOLHDL 2.4 04/18/2020   Lab Results  Component Value Date   TSH 1.790 04/18/2020   Lab Results  Component Value Date   HGBA1C 7.9 (H) 04/18/2020   Lab Results  Component Value Date   WBC 6.0 04/18/2020   HGB 12.5 04/18/2020   HCT 37.0 04/18/2020   MCV 84 04/18/2020   PLT 221 04/18/2020   Lab Results  Component Value Date   ALT 20 04/18/2020   AST 25 04/18/2020   ALKPHOS 79 04/18/2020   BILITOT 0.6 04/18/2020     Review of Systems  Constitutional: Negative for appetite change, fatigue, fever and unexpected weight change.  HENT: Negative for tinnitus and trouble swallowing.   Eyes:  Negative for visual disturbance.  Respiratory: Negative for cough, chest tightness and shortness of breath.   Cardiovascular: Negative for chest pain, palpitations and leg swelling.  Gastrointestinal: Negative for abdominal pain.  Endocrine: Negative for polydipsia and polyuria.  Genitourinary: Negative for dysuria and hematuria.  Musculoskeletal: Negative for arthralgias.  Neurological: Negative for tremors, numbness and headaches.  Psychiatric/Behavioral: Negative for dysphoric mood.    Patient Active Problem List   Diagnosis Date Noted  . 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  . Mass of chest wall, left 04/18/2020  . 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 Hyperlipidemia associated with type 2 diabetes mellitus ([place]) 03/26/2017  . 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 Pre-ulcerative calluses 03/26/2017  . Arthritis of right knee 01/15/2016  . DM (diabetes mellitus), type 2 with complications ([place]) 48/18/5631  . Abnormal LFTs 03/15/2015  . Essential (primary) hypertension 03/15/2015  . Gastro-esophageal reflux disease without esophagitis 03/15/2015  . Allergic rhinitis, seasonal 03/15/2015    Allergies  Allergen Reactions  . Ace Inhibitors Cough  . Metformin And Related Diarrhea  . Penicillins Rash  . Sulfa Antibiotics Rash    Past Surgical History:  Procedure Laterality Date  . CATARACT EXTRACTION Right 08/2014  . CATARACT EXTRACTION Left 04/2016  . HAMMER TOE SURGERY    . PARTIAL HYSTERECTOMY     fibroids    Social History   Tobacco Use  . Smoking status: Never Smoker  . Smokeless tobacco: Never Used  Vaping  Use  . Vaping Use: Never used  Substance Use Topics  . Alcohol use: No    Alcohol/week: 0.0 standard drinks  . Drug use: No     Medication list has been reviewed and updated.  Current Meds  Medication Sig  . acetaminophen (TYLENOL) 500 MG tablet Take 500 mg by mouth every 6 (six) hours as needed.  [name] amLODipine (NORVASC) 2.5 MG tablet Take 1 tablet (2.5  mg total) by mouth daily.  . blood glucose meter kit and supplies KIT Dispense based on patient and insurance preference. Test BS bid  . docusate sodium (COLACE) 100 MG capsule Take 100 mg by mouth 2 (two) times daily as needed.  [name] glimepiride (AMARYL) 4 MG tablet TAKE 1 TABLET (4 MG TOTAL) BY MOUTH DAILY BEFORE BREAKFAST.  [name] glucose blood (ONE TOUCH ULTRA TEST) test strip E11.9  . hydrochlorothiazide (HYDRODIURIL) 25 MG tablet Take 1 tablet (25 mg total) by mouth daily.  [name] ibuprofen (ADVIL,MOTRIN) 600 MG tablet TAKE 1 TABLET (600 MG TOTAL) BY MOUTH EVERY 8 (EIGHT) HOURS AS NEEDED.  [name] Lancets (ONETOUCH ULTRASOFT) lancets Use as instructed  . levocetirizine (XYZAL) 5 MG tablet TAKE 1 TABLET BY MOUTH EVERY DAY IN THE EVENING  . losartan (COZAAR) 50 MG tablet TAKE 1 TABLET BY MOUTH EVERY DAY  . mometasone (NASONEX) 50 MCG/ACT nasal spray Place 2 sprays into the nose daily.  . Multiple Vitamin (MULTIVITAMIN PO) Take by mouth.  [name] omeprazole (PRILOSEC) 40 MG capsule TAKE 1 CAPSULE BY MOUTH EVERY DAY  . simvastatin (ZOCOR) 10 MG tablet TAKE 1 TABLET BY MOUTH EVERYDAY AT BEDTIME  . triamcinolone (KENALOG) 0.1 % Apply topically.    PHQ 2/9 Scores 08/20/2020 08/03/2020 07/30/2020 05/28/2020  PHQ - 2 Score 0 0 0 0  PHQ- 9 Score 0 0 2 -    GAD 7 : Generalized Anxiety Score 08/20/2020 08/03/2020 07/30/2020 04/18/2020  Nervous, Anxious, on Edge 0 0 0 0  Control/stop worrying 0 0 0 0  Worry too much - different things 0 0 0 0  Trouble relaxing 0 0 0 0  Restless 0 0 0 0  Easily annoyed or irritable 0 0 0 0  Afraid - awful might happen 0 0 0 0  Total GAD 7 Score 0 0 0 0  Anxiety Difficulty - - - -    BP Readings from Last 3 Encounters:  08/20/20 136/80  08/03/20 138/68  07/30/20 130/76    Physical Exam Vitals and nursing note reviewed.  Constitutional:      General: She is not in acute distress.    Appearance: She is well-developed.  HENT:     Head: Normocephalic and atraumatic.  Cardiovascular:      Rate and Rhythm: Normal rate and regular rhythm.     Pulses: Normal pulses.     Heart sounds: No murmur heard.   Pulmonary:     Effort: Pulmonary effort is normal. No respiratory distress.     Breath sounds: Normal breath sounds. No wheezing or rhonchi.  Chest:    Musculoskeletal:        General: Normal range of motion.     Cervical back: Normal range of motion.     Right lower leg: No edema.     Left lower leg: No edema.  Skin:    General: Skin is warm and dry.     Findings: No rash.  Neurological:     Mental Status: She is alert and oriented to person, place, and time.  Psychiatric:        Mood and Affect: Mood normal.        Behavior: Behavior normal.     Wt Readings from Last 3 Encounters:  08/20/20 174 lb (78.9 kg)  08/03/20 171 lb (77.6 kg)  07/30/20 170 lb (77.1 kg)    BP 136/80   Pulse 75   Temp 97.9 F (36.6 C) (Oral)   Ht _0  (1.575 m)   Wt 174 lb (78.9 kg)   SpO2 99%   BMI 31.83 kg/m   Assessment and Plan: 1. DM (diabetes mellitus), type 2 with complications ([place]) Clinically stable by exam and report without s/s of hypoglycemia. DM complicated by HTN. Tolerating medications well without side effects or other concerns but A1C is not improved Will start Farxiga 5 mg. - POCT glycosylated hemoglobin (Hb A1C) = 7.9 - dapagliflozin propanediol (FARXIGA) 5 MG TABS tablet; Take 1 tablet (5 mg total) by mouth daily before breakfast.  Dispense: 90 tablet; Refill: 1  2. Essential (primary) hypertension Clinically stable exam with well controlled BP on ARB. Tolerating medications without side effects at this time. Pt to continue current regimen and low sodium diet; benefits of regular exercise as able discussed.   Partially dictated using Editor, commissioning. Any errors are unintentional.  [name], MD [facility]  08/20/2020

## 2020-08-20 NOTE — Telephone Encounter (Unsigned)
Copied from [place] [id number]. Topic: General - Call Back - No Documentation >> Aug 20, 2020  1:16 PM Erick Blinks wrote: Best contact: [phone] Patient wants to discuss lab results, please advise if [facility] may disclose

## 2020-08-20 NOTE — Telephone Encounter (Signed)
Called pt could not leave VM. VM is not set up.  Will route result note to [facility] Nurse Triage for follow up when patient returns call to clinic. Nurse may give results to patient if they return call. CRM created for this message.   KP

## 2020-08-21 NOTE — Telephone Encounter (Signed)
Patient returned call- she was notified of her lab result- stable from 4 months ago- she did want PCP to know that she had taken Prednisone for 10 days about 1 week prior to testing. Patient is aware that this may have altered her test results- but maybe not enough to make difference. Patient advise will notify PCP- and call her back with any comment/recommendations from PCP. Patient has started taking the new Rx(Farxiga) prescribed.

## 2020-08-21 NOTE — Telephone Encounter (Signed)
Attempted to contact patient- No answer and VM not set up. Unable to leave call back message.

## 2020-08-30 ENCOUNTER — Other Ambulatory Visit: Payer: Self-pay

## 2020-08-30 ENCOUNTER — Other Ambulatory Visit: Payer: Self-pay | Admitting: Internal Medicine

## 2020-08-30 DIAGNOSIS — I10 Essential (primary) hypertension: Secondary | ICD-10-CM

## 2020-08-30 NOTE — Telephone Encounter (Signed)
Requested medication (s) are due for refill today: expired medication  Requested medication (s) are on the active medication list: yes for ultra blue test strips. One touch delica 33 G lancets ordered not ultra blue.   Last refill:  04/09/2018 #100 each 12 refills  Future visit scheduled: yes in 3 months   Notes to clinic:  expired medicaitons     Requested Prescriptions  Pending Prescriptions Disp Refills   OneTouch Delica Lancets 62H MISC [Pharmacy Med Name: ONE TOUCH DELICA 47M LANCETS] 546 each 12    Sig: TEST TWICE DAILY      Endocrinology: Diabetes - Testing Supplies Passed - 08/30/2020 12:18 PM      Passed - Valid encounter within last 12 months    Recent Outpatient Visits           1 week ago DM (diabetes mellitus), type 2 with complications [facility])   [facility] [name], [facility]   3 weeks ago Cellulitis of finger of left hand   [facility] [name], MD   1 month ago Acute non-recurrent maxillary sinusitis   [facility] [name], MD   4 months ago Annual physical exam   [facility] [name], MD   7 months ago Back muscle spasm   [facility] [name], MD       Future Appointments             In 3 months [name] Jesse Sans, MD [facility], [place]   In 7 months [name], Jesse Sans, MD [facility], [place] test strip [place] Med Name: [place] TEST STRP] 100 strip     Sig: TEST TWICE DAILY      Endocrinology: Diabetes - Testing Supplies Passed - 08/30/2020 12:18 PM      Passed - Valid encounter within last 12 months    Recent Outpatient Visits           1 week ago DM (diabetes mellitus), type 2 with complications [facility])   [facility] [name], MD   3 weeks ago Cellulitis of finger of left hand   [facility] [name], MD   1 month ago Acute non-recurrent maxillary sinusitis    [facility] [name], MD   4 months ago Annual physical exam   [facility] [name], MD   7 months ago Back muscle spasm   [facility] [name], MD       Future Appointments             In 3 months [name] Jesse Sans, MD [facility], [place]   In 7 months [name] Jesse Sans, MD [facility], [facility]              Signed Prescriptions Disp Refills   amLODipine (NORVASC) 2.5 MG tablet 90 tablet 0    Sig: TAKE 1 TABLET BY MOUTH EVERY DAY      Cardiovascular:  Calcium Channel Blockers Passed - 08/30/2020 12:18 PM      Passed - Last BP in normal range    BP Readings from Last 1 Encounters:  08/20/20 136/80          Passed - Valid encounter within last 6 months    Recent Outpatient Visits  1 week ago DM (diabetes mellitus), type 2 with complications [facility])   [facility] [name], [facility]   3 weeks ago Cellulitis of finger of left hand   [facility] [name], MD   1 month ago Acute non-recurrent maxillary sinusitis   [facility] [name], MD   4 months ago Annual physical exam   [facility] [name], MD   7 months ago Back muscle spasm   [facility] [name], MD       Future Appointments             In 3 months [name] Jesse Sans, MD [facility], [place]   In 7 months [name], Jesse Sans, MD [facility], [facility]

## 2020-08-30 NOTE — Telephone Encounter (Signed)
Future visit in 3 months  

## 2020-09-30 ENCOUNTER — Other Ambulatory Visit: Payer: Self-pay | Admitting: Internal Medicine

## 2020-09-30 DIAGNOSIS — E119 Type 2 diabetes mellitus without complications: Secondary | ICD-10-CM

## 2020-09-30 NOTE — Telephone Encounter (Signed)
Requested Prescriptions  Pending Prescriptions Disp Refills  . simvastatin (ZOCOR) 10 MG tablet [Pharmacy Med Name: SIMVASTATIN 10 MG TABLET] 90 tablet 2    Sig: TAKE 1 TABLET BY MOUTH EVERYDAY AT BEDTIME     Cardiovascular:  Antilipid - Statins Passed - 09/30/2020  2:46 PM      Passed - Total Cholesterol in normal range and within 360 days    Cholesterol, Total  Date Value Ref Range Status  04/18/2020 101 100 - 199 mg/dL Final         Passed - LDL in normal range and within 360 days    LDL Chol Calc (NIH)  Date Value Ref Range Status  04/18/2020 47 0 - 99 mg/dL Final         Passed - HDL in normal range and within 360 days    HDL  Date Value Ref Range Status  04/18/2020 42 >39 mg/dL Final         Passed - Triglycerides in normal range and within 360 days    Triglycerides  Date Value Ref Range Status  04/18/2020 48 0 - 149 mg/dL Final         Passed - Patient is not pregnant      Passed - Valid encounter within last 12 months    Recent Outpatient Visits          1 month ago DM (diabetes mellitus), type 2 with complications [facility])   [facility] [name], MD   1 month ago Cellulitis of finger of left hand   [facility] [name], MD   2 months ago Acute non-recurrent maxillary sinusitis   [facility] [name], MD   5 months ago Annual physical exam   [facility] [name], MD   8 months ago Back muscle spasm   [facility] [name], MD      Future Appointments            In 2 months [name] Jesse Sans, MD [facility], [place]   In 6 months [name], Jesse Sans, MD [facility], [facility]

## 2020-10-31 ENCOUNTER — Other Ambulatory Visit: Payer: Self-pay | Admitting: Internal Medicine

## 2020-10-31 DIAGNOSIS — E118 Type 2 diabetes mellitus with unspecified complications: Secondary | ICD-10-CM

## 2020-10-31 DIAGNOSIS — I10 Essential (primary) hypertension: Secondary | ICD-10-CM

## 2020-11-01 ENCOUNTER — Telehealth: Payer: Self-pay | Admitting: Pharmacist

## 2020-11-01 NOTE — Chronic Care Management (AMB) (Signed)
  Chronic Care Management Pharmacy Assistant   Name: Kathy Dougherty  MRN: [id number] DOB: 05/21/1950   Reason for Encounter: Disease State Diabetes Mellitus   Recent office visits:  08/20/20-Laura Berglund, MD (PCP) Diabetic Follow up. Start Farxiga 5 mg. Follow up in 4 months. 08/03/20-Laura Berglund, MD (PCP) Seen for Edema of left pinky finger. Patient was given steroid taper to help with inflammation and swelling. Start on predniSONE (DELTASONE) 10 MG tablet; Take 1 tablet (10 mg total) by mouth as directed for 6 days. Take 6,5,4,3,2,1 then stop  Dispense: 21 tablet. 07/30/20-Laura Bergulund, MD (PCP) Seen for sinunsitis. Stop Xyzal and start allegra instead. Resume Nasonex. Start Doxycycline 100 mg. Recent consult visits:  05/28/20-([facility] Notes not provided.  Hospital visits:  Medication Reconciliation was completed by comparing discharge summary, patient's EMR and Pharmacy list, and upon discussion with patient.  Admitted to the hospital on 08/02/20 due to Finger swelling. Discharge date was none noted. Discharged from [facility].    New?Medications Started at Hospital Discharge:?? -started topical steroids (triamcinolone) twice daily until symptoms resolve for poison ivy  Medication Changes at Hospital Discharge: -Changed none noted  Medications Discontinued at Hospital Discharge: -Stopped none noted  Medications that remain the same after Hospital Discharge:??  -All other medications will remain the same.    Medications: Outpatient Encounter Medications as of 11/01/2020  Medication Sig   acetaminophen (TYLENOL) 500 MG tablet Take 500 mg by mouth every 6 (six) hours as needed.   amLODipine (NORVASC) 2.5 MG tablet TAKE 1 TABLET BY MOUTH EVERY DAY   blood glucose meter kit and supplies KIT Dispense based on patient and insurance preference. Test BS bid   dapagliflozin propanediol (FARXIGA) 5 MG TABS tablet Take 1 tablet (5 mg  total) by mouth daily before breakfast.   docusate sodium (COLACE) 100 MG capsule Take 100 mg by mouth 2 (two) times daily as needed.   glimepiride (AMARYL) 4 MG tablet TAKE 1 TABLET BY MOUTH DAILY BEFORE BREAKFAST.   hydrochlorothiazide (HYDRODIURIL) 25 MG tablet Take 1 tablet (25 mg total) by mouth daily.   ibuprofen (ADVIL,MOTRIN) 600 MG tablet TAKE 1 TABLET (600 MG TOTAL) BY MOUTH EVERY 8 (EIGHT) HOURS AS NEEDED.   levocetirizine (XYZAL) 5 MG tablet TAKE 1 TABLET BY MOUTH EVERY DAY IN THE EVENING   losartan (COZAAR) 50 MG tablet TAKE 1 TABLET BY MOUTH EVERY DAY   mometasone (NASONEX) 50 MCG/ACT nasal spray Place 2 sprays into the nose daily.   Multiple Vitamin (MULTIVITAMIN PO) Take by mouth.   omeprazole (PRILOSEC) 40 MG capsule TAKE 1 CAPSULE BY MOUTH EVERY DAY   OneTouch Delica Lancets 33G MISC TEST TWICE DAILY   ONETOUCH ULTRA test strip TEST TWICE DAILY   simvastatin (ZOCOR) 10 MG tablet TAKE 1 TABLET BY MOUTH EVERYDAY AT BEDTIME   triamcinolone (KENALOG) 0.1 % Apply topically.   No facility-administered encounter medications on file as of 11/01/2020.    Recent Relevant Labs: Lab Results  Component Value Date/Time   HGBA1C 7.9 (A) 08/20/2020 09:27 AM   HGBA1C 7.9 (H) 04/18/2020 09:31 AM   HGBA1C 8.7 (H) 12/08/2019 08:55 AM    Kidney Function Lab Results  Component Value Date/Time   CREATININE 0.70 04/18/2020 09:31 AM   CREATININE 0.76 12/08/2019 08:55 AM   GFRNONAA 89 04/18/2020 09:31 AM   GFRAA 102 04/18/2020 09:31 AM    Current antihyperglycemic regimen:  Glimepiride 4 mg ac breakfast Farxiga 5 mg take 1 tab daily  What   recent interventions/DTPs have been made to improve glycemic control:  None noted  Have there been any recent hospitalizations or ED visits since last visit with CPP? Yes   How often are you checking your blood sugar? twice daily      Patient could not give me a readings due to her wanting me to call her back tomorrow 11/14/20.  What are your  blood sugars ranging?  Fasting:  Before meals:  After meals:  Bedtime:  During the week, how often does your blood glucose drop below 70?   Are you checking your feet daily/regularly?    Adherence Review: Is the patient currently on a STATIN medication? Yes Is the patient currently on ACE/ARB medication? Yes Does the patient have >5 day gap between last estimated fill dates? No  Unable to contact patient again. For her Disease state call for Diabetes Mellitus.  Star Rating Drugs: Losartan 50 mg Last filled:09/10/20 90 DS Simvastatin 10 mg Last filled:09/30/20 90 DS Farxiga 5 mg Last filled:08/20/20 90 DS   Myriam Elta [place], [place]

## 2020-11-24 ENCOUNTER — Other Ambulatory Visit: Payer: Self-pay | Admitting: Internal Medicine

## 2020-11-24 DIAGNOSIS — I10 Essential (primary) hypertension: Secondary | ICD-10-CM

## 2020-11-24 NOTE — Telephone Encounter (Signed)
Future in 2 weeks

## 2020-11-28 ENCOUNTER — Other Ambulatory Visit: Payer: Self-pay

## 2020-11-28 DIAGNOSIS — I10 Essential (primary) hypertension: Secondary | ICD-10-CM

## 2020-11-28 MED ORDER — HYDROCHLOROTHIAZIDE 25 MG PO TABS: 25.0000 mg | ORAL_TABLET | Freq: Every day | ORAL | 0 refills | Status: DC

## 2020-12-11 ENCOUNTER — Ambulatory Visit: Payer: Medicare HMO | Admitting: Internal Medicine

## 2020-12-11 ENCOUNTER — Telehealth: Payer: Self-pay

## 2020-12-11 DIAGNOSIS — U071 COVID-19: Secondary | ICD-10-CM | POA: Diagnosis not present

## 2020-12-11 DIAGNOSIS — Z20822 Contact with and (suspected) exposure to covid-19: Secondary | ICD-10-CM | POA: Diagnosis not present

## 2020-12-11 NOTE — Telephone Encounter (Signed)
Patient tested positive today at Urgent Care with no symptoms. Patient was given Paxlovid- her risk is a 4 for complications. Told her if they recommended her to take the medication, then she should take it.

## 2020-12-11 NOTE — Telephone Encounter (Unsigned)
Copied from [place] [phone]. Topic: General - Other >> Dec 11, 2020  1:26 PM Pawlus, Brayton Layman A wrote: Reason for CRM: Pt stated she tested positive for COVID, pt wanted to know if she would qualify to take the medication and if it can be combined with her current meds.

## 2020-12-16 DIAGNOSIS — Z8616 Personal history of COVID-19: Secondary | ICD-10-CM | POA: Diagnosis not present

## 2020-12-17 ENCOUNTER — Ambulatory Visit: Payer: Self-pay | Admitting: Internal Medicine

## 2020-12-17 ENCOUNTER — Emergency Department: Payer: Medicare HMO

## 2020-12-17 ENCOUNTER — Ambulatory Visit: Payer: Self-pay | Admitting: *Deleted

## 2020-12-17 ENCOUNTER — Ambulatory Visit: Payer: Self-pay

## 2020-12-17 DIAGNOSIS — E119 Type 2 diabetes mellitus without complications: Secondary | ICD-10-CM | POA: Insufficient documentation

## 2020-12-17 DIAGNOSIS — I10 Essential (primary) hypertension: Secondary | ICD-10-CM | POA: Insufficient documentation

## 2020-12-17 DIAGNOSIS — U071 COVID-19: Secondary | ICD-10-CM | POA: Diagnosis not present

## 2020-12-17 DIAGNOSIS — R0602 Shortness of breath: Secondary | ICD-10-CM | POA: Diagnosis not present

## 2020-12-17 DIAGNOSIS — E876 Hypokalemia: Secondary | ICD-10-CM | POA: Insufficient documentation

## 2020-12-17 DIAGNOSIS — Z79899 Other long term (current) drug therapy: Secondary | ICD-10-CM | POA: Insufficient documentation

## 2020-12-17 DIAGNOSIS — Z7984 Long term (current) use of oral hypoglycemic drugs: Secondary | ICD-10-CM | POA: Insufficient documentation

## 2020-12-17 IMAGING — CR DG CHEST 2V
1 series · 2 of 2 positions shown · non-contrast
Comparison: None.

CLINICAL DATA: shortness of breath.  RLBMD-QS positive

EXAM:
CHEST - 2 VIEW

[Series 1: dg chest 2 view · 0.14mm/px · 2 of 2 slices shown]
[im 1/2]
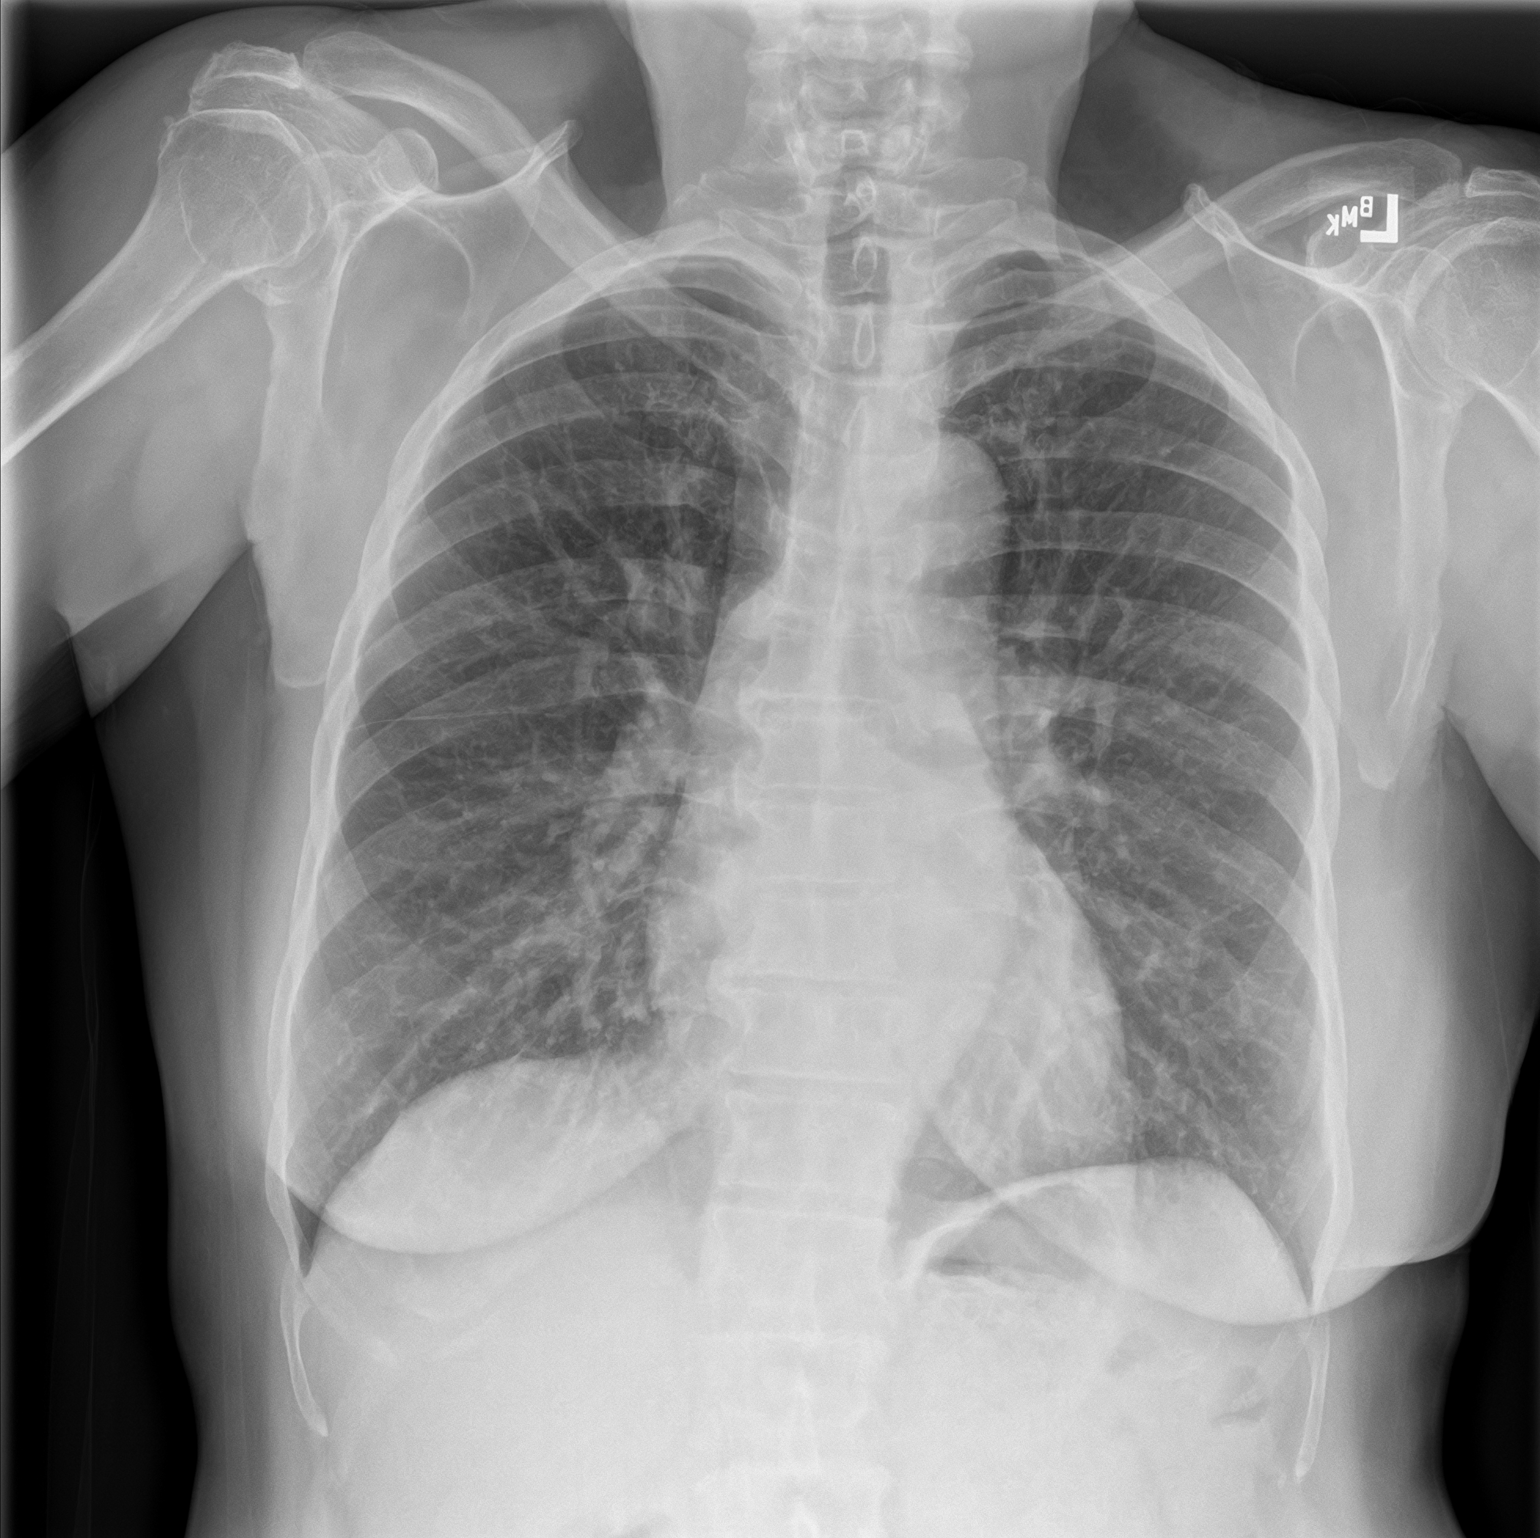
[im 2/2]
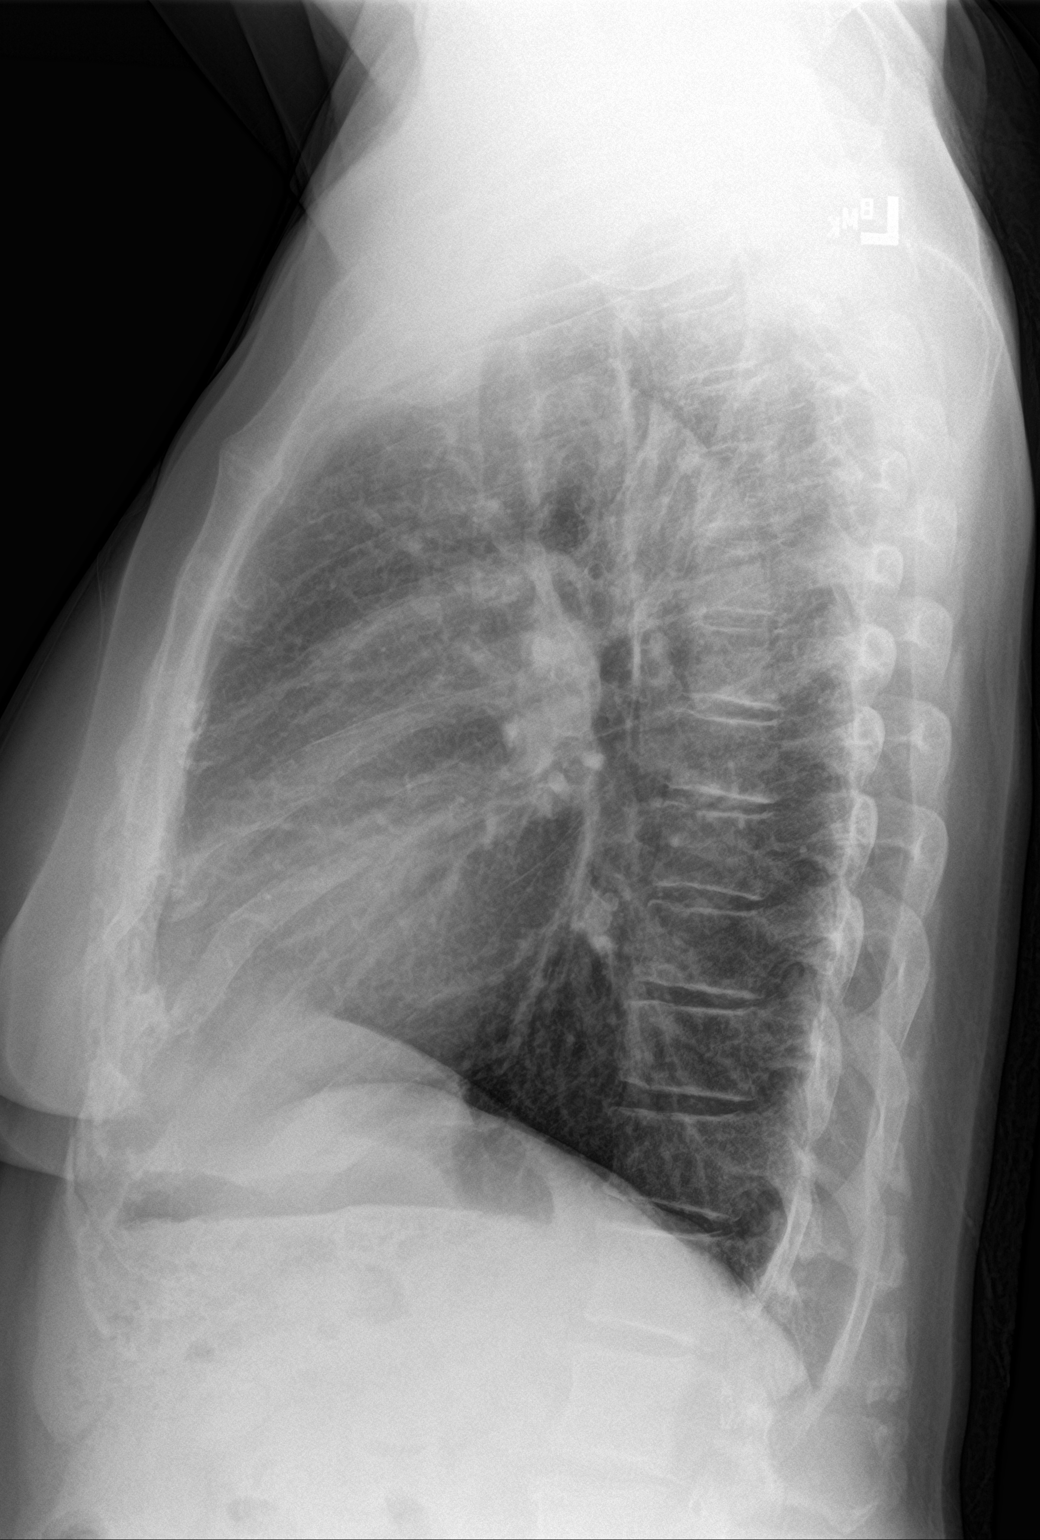

[2 of 2 positions shown; findings below may reference images not displayed]

FINDINGS: The heart size and mediastinal contours are within normal limits.
Mildly increased interstitial markings throughout both lungs. No
focal airspace consolidation. No pleural effusion or pneumothorax.
The visualized skeletal structures are unremarkable.
IMPRESSION: Mildly increased interstitial markings throughout both lungs, which
may reflect bronchitic type lung changes versus developing
atypical/viral infection. No focal airspace consolidation.

## 2020-12-17 NOTE — Telephone Encounter (Signed)
Pt called. She has COVID. She is now having difficulty breathing. Patient wanted to see her PCP. Pt states that with exertion she becomes SOB.   Pt is hypertensive, over 64 and diabetic.   No appointments available this evening. Pt will go to UC for care.   Helped Pt find a UC near her home.   Reason for Disposition  [1] MILD difficulty breathing (e.g., minimal/no SOB at rest, SOB with walking, pulse <100) AND [2] NEW-onset or WORSE than normal  Answer Assessment - Initial Assessment Questions 1. RESPIRATORY STATUS: "Describe your breathing?" (e.g., wheezing, shortness of breath, unable to speak, severe coughing)      Shortness of breath 2. ONSET: "When did this breathing problem begin?"      This morning 3. PATTERN "Does the difficult breathing come and go, or has it been constant since it started?"      Constant. More pronounced with exertion. 4. SEVERITY: "How bad is your breathing?" (e.g., mild, moderate, severe)    - MILD: No SOB at rest, mild SOB with walking, speaks normally in sentences, can lie down, no retractions, pulse < 100.    - MODERATE: SOB at rest, SOB with minimal exertion and prefers to sit, cannot lie down flat, speaks in phrases, mild retractions, audible wheezing, pulse 100-120.    - SEVERE: Very SOB at rest, speaks in single words, struggling to breathe, sitting hunched forward, retractions, pulse > 120      Moderate 5. RECURRENT SYMPTOM: "Have you had difficulty breathing before?" If Yes, ask: "When was the last time?" and "What happened that time?"      no 6. CARDIAC HISTORY: "Do you have any history of heart disease?" (e.g., heart attack, angina, bypass surgery, angioplasty)      none 7. LUNG HISTORY: "Do you have any history of lung disease?"  (e.g., pulmonary embolus, asthma, emphysema)     none 8. CAUSE: "What do you think is causing the breathing problem?"      COVID 9. OTHER SYMPTOMS: "Do you have any other symptoms? (e.g., dizziness, runny nose, cough,  chest pain, fever)     none 10. O2 SATURATION MONITOR:  "Do you use an oxygen saturation monitor (pulse oximeter) at home?" If Yes, "What is your reading (oxygen level) today?" "What is your usual oxygen saturation reading?" (e.g., 95%)       na 11. PREGNANCY: "Is there any chance you are pregnant?" "When was your last menstrual period?"       na 12. TRAVEL: "Have you traveled out of the country in the last month?" (e.g., travel history, exposures)       no  Protocols used: Breathing Difficulty-A-AH

## 2020-12-17 NOTE — Telephone Encounter (Signed)
I returned pt's call and answered her questions and went over the quarantine protocol per latest CDC guidelines.  She thanked me for calling her back and helping her.

## 2020-12-17 NOTE — ED Triage Notes (Signed)
Patient presents to ER from home. Patient reports she tested COVID + 1 week ago. Patient reports she was outside a lot today in the heat working in the yard, patient reports since she came in she has felt short of breath. Patient breathing non labored. Patient reports she has completed Paxlovid medication. Patient A&OX3.

## 2020-12-17 NOTE — Telephone Encounter (Signed)
Reason for Disposition  Health Information question, no triage required and triager able to answer question    Answered Covid questions  Answer Assessment - Initial Assessment Questions 1. REASON FOR CALL or QUESTION: "What is your reason for calling today?" or "How can I best help you?" or "What question do you have that I can help answer?"     I tested positive for Covid on Tues (7/26) when I went to the drug store.   My home test on Sat before that was negative.   I didn't know I was having symptoms because I have allergies.   I had a scratchy throat and stuffy nose, that's all.   I was exposed to someone last weekend is the reason I tested.  I didn't know how long I needed to quarantine plus I retested yesterday and my test was still positive.   When do I need to retest?  I went over the quarantine protocol per the latest CDC guidelines.   I answered her questions.   I let her know she needed to wear a mask for the next 5 days (thru Friday) and avoid being around anyone that is considered a high risk;  getting chemotherapy, heart disease, lung problems, weakened immune system, etc.   She verbalized understanding and thanked me very much for answering her questions.  Protocols used: Information Only Call - No Triage-A-AH

## 2020-12-18 ENCOUNTER — Emergency Department
Admission: EM | Admit: 2020-12-18 | Discharge: 2020-12-18 | Disposition: A | Discharge status: Home / Self Care | Payer: Medicare HMO | Attending: Emergency Medicine | Admitting: Emergency Medicine

## 2020-12-18 DIAGNOSIS — U071 COVID-19: Secondary | ICD-10-CM | POA: Diagnosis not present

## 2020-12-18 DIAGNOSIS — E876 Hypokalemia: Secondary | ICD-10-CM | POA: Diagnosis not present

## 2020-12-18 DIAGNOSIS — R0602 Shortness of breath: Secondary | ICD-10-CM | POA: Diagnosis not present

## 2020-12-18 LAB — BASIC METABOLIC PANEL
Anion gap: 11 (ref 5–15)
BUN: 14 mg/dL (ref 8–23)
CO2: 29 mmol/L (ref 22–32)
Calcium: 9.6 mg/dL (ref 8.9–10.3)
Chloride: 98 mmol/L (ref 98–111)
Creatinine, Ser: 0.64 mg/dL (ref 0.44–1.00)
GFR, Estimated: >60 mL/min (ref >60)
Glucose, Bld: 136 mg/dL — ABNORMAL HIGH (ref 70–99)
Potassium: 2.8 mmol/L — ABNORMAL LOW (ref 3.5–5.1)
Sodium: 138 mmol/L (ref 135–145)

## 2020-12-18 LAB — CBC
HCT: 40.3 % (ref 36.0–46.0)
Hemoglobin: 13.4 g/dL (ref 12.0–15.0)
MCH: 28.3 pg (ref 26.0–34.0)
MCHC: 33.3 g/dL (ref 30.0–36.0)
MCV: 85.2 fL (ref 80.0–100.0)
Platelets: 228 10*3/uL (ref 150–400)
RBC: 4.73 MIL/uL (ref 3.87–5.11)
RDW: 12.7 % (ref 11.5–15.5)
WBC: 7 10*3/uL (ref 4.0–10.5)
nRBC: 0 % (ref 0.0–0.2)

## 2020-12-18 LAB — TROPONIN I (HIGH SENSITIVITY)
Troponin I (High Sensitivity): 6 ng/L (ref <18)
Troponin I (High Sensitivity): 6 ng/L (ref <18)

## 2020-12-18 MED ORDER — POTASSIUM CHLORIDE CRYS ER 20 MEQ PO TBCR
40.0000 meq | EXTENDED_RELEASE_TABLET | Freq: Once | ORAL | Status: AC
  Administered 2020-12-18: 40 meq via ORAL
  Filled 2020-12-18: qty 2

## 2020-12-18 MED ORDER — ALBUTEROL SULFATE HFA 108 (90 BASE) MCG/ACT IN AERS: 2.0000 | INHALATION_SPRAY | RESPIRATORY_TRACT | 0 refills | Status: DC | PRN

## 2020-12-18 NOTE — ED Notes (Signed)
Patient ambulated around room to assess oxygen saturation level while moving Oxygen saturation level remained at 98% MD aware of same

## 2020-12-18 NOTE — ED Provider Notes (Signed)
[facility] Emergency Department Provider Note   ____________________________________________   Event Date/Time   First MD Initiated Contact with Patient 12/18/20 [phone]     (approximate)  I have reviewed the triage vital signs and the nursing notes.   HISTORY  Chief Complaint Shortness of Breath    HPI Kathy Dougherty is a 70 y.o. female who presents to the ED from home with a chief complaint of shortness of breath.  Patient tested positive for COVID-19 on 12/11/2020.  She has finished a 5-day course of Paxlovid.  States she was outdoors a lot yesterday in the heat working in her yard.  She came into her house and felt short of breath.  Denies fever, cough, chest pain, abdominal pain, nausea, vomiting or diarrhea.  Voices no complaints at the time of our interview and examination.     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   Hyperlipidemia associated with type 2 diabetes mellitus ([place]) 03/26/2017   Iritis of left eye 03/26/2017   Pre-ulcerative calluses 03/26/2017   Arthritis of right knee 01/15/2016   DM (diabetes mellitus), type 2 with complications ([place]) 50/53/9767   Abnormal LFTs 03/15/2015   Essential (primary) hypertension 03/15/2015   Gastro-esophageal reflux disease without esophagitis 03/15/2015   Allergic rhinitis, seasonal 03/15/2015    Past Surgical History:  Procedure Laterality Date   CATARACT EXTRACTION Right 08/2014   CATARACT EXTRACTION Left 04/2016   HAMMER TOE SURGERY     PARTIAL HYSTERECTOMY     fibroids    Prior to Admission medications   Medication Sig Start Date End Date Taking? Authorizing Provider   albuterol (VENTOLIN HFA) 108 (90 Base) MCG/ACT inhaler Inhale 2 puffs into the lungs every 4 (four) hours as needed for wheezing or shortness of breath. 12/18/20  Yes Paulette Blanch, MD  acetaminophen (TYLENOL) 500 MG tablet Take 500 mg by mouth every 6 (six) hours as needed.    [provider]  amLODipine (NORVASC) 2.5 MG tablet TAKE 1 TABLET BY MOUTH EVERY DAY 11/24/20   [name], MD  blood glucose meter kit and supplies KIT Dispense based on patient and insurance preference. Test BS bid 04/18/20   [name], MD  dapagliflozin propanediol (FARXIGA) 5 MG TABS tablet Take 1 tablet (5 mg total) by mouth daily before breakfast. 08/20/20   [name], MD  docusate sodium (COLACE) 100 MG capsule Take 100 mg by mouth 2 (two) times daily as needed.    [provider]  glimepiride (AMARYL) 4 MG tablet TAKE 1 TABLET BY MOUTH DAILY BEFORE BREAKFAST. 10/31/20   [name], MD  hydrochlorothiazide (HYDRODIURIL) 25 MG tablet Take 1 tablet (25 mg total) by mouth daily. 11/28/20   [name], MD  ibuprofen (ADVIL,MOTRIN) 600 MG tablet TAKE 1 TABLET (600 MG TOTAL) BY MOUTH EVERY 8 (EIGHT) HOURS AS NEEDED. 08/16/18   [name], MD  levocetirizine (XYZAL) 5 MG tablet TAKE 1 TABLET BY MOUTH EVERY DAY IN THE EVENING 05/06/19   [name], MD  losartan (COZAAR) 50 MG tablet TAKE 1 TABLET BY MOUTH EVERY DAY 10/31/20  [name], MD  mometasone (NASONEX) 50 MCG/ACT nasal spray Place 2 sprays into the nose daily. 07/30/20   [name], MD  Multiple Vitamin (MULTIVITAMIN PO) Take by mouth.    [provider]  omeprazole (PRILOSEC) 40 MG capsule TAKE 1 CAPSULE BY MOUTH EVERY DAY 07/22/20   [name], MD  OneTouch Delica Lancets 08Q [place] TEST TWICE DAILY 08/30/20   [name], MD  [facility] ULTRA test strip TEST TWICE DAILY 08/30/20   [name], MD  simvastatin (ZOCOR) 10 MG tablet TAKE 1 TABLET BY MOUTH EVERYDAY AT BEDTIME  09/30/20   [name], MD  triamcinolone (KENALOG) 0.1 % Apply topically. 08/02/20 08/02/21  [provider]    Allergies Ace inhibitors, Metformin and related, Penicillins, and Sulfa antibiotics  Family History  Problem Relation Age of Onset   Diabetes Mother    Cancer Brother    Diabetes Sister    Cancer Brother    Heart disease Brother    Sudden death Brother    Heart disease Brother    Diabetes Brother    Diabetes Brother     Social History Social History   Tobacco Use   Smoking status: Never   Smokeless tobacco: Never  Vaping Use   Vaping Use: Never used  Substance Use Topics   Alcohol use: No    Alcohol/week: 0.0 standard drinks   Drug use: No    Review of Systems  Constitutional: No fever/chills Eyes: No visual changes. ENT: No sore throat. Cardiovascular: Denies chest pain. Respiratory: Positive for shortness of breath. Gastrointestinal: No abdominal pain.  No nausea, no vomiting.  No diarrhea.  No constipation. Genitourinary: Negative for dysuria. Musculoskeletal: Negative for back pain. Skin: Negative for rash. Neurological: Negative for headaches, focal weakness or numbness.   ____________________________________________   PHYSICAL EXAM:  VITAL SIGNS: ED Triage Vitals  Enc Vitals Group     BP 12/17/20 1830 (!) 157/94     Pulse Rate 12/17/20 1830 (!) 103     Resp 12/17/20 1830 18     Temp 12/17/20 1830 99 F (37.2 C)     Temp Source 12/17/20 1830 Oral     SpO2 12/17/20 1830 97 %     Weight 12/17/20 1831 171 lb 15.3 oz (78 kg)     Height 12/17/20 1831 _0  (1.575 m)     Head Circumference --      Peak Flow --      Pain Score 12/17/20 1831 0     Pain Loc --      Pain Edu? --      Excl. in [place]? --     Constitutional: Alert and oriented. Well appearing and in no acute distress. Eyes: Conjunctivae are normal. PERRL. EOMI. Head: Atraumatic. Nose: No congestion/rhinnorhea. Mouth/Throat: Mucous membranes are moist.   Neck:  No stridor.   Cardiovascular: Normal rate, regular rhythm. Grossly normal heart sounds.  Good peripheral circulation. Respiratory: Normal respiratory effort.  No retractions. Lungs CTAB.  No wheezing, rhonchi or rales. Gastrointestinal: Soft and nontender. No distention. No abdominal bruits. No CVA tenderness. Musculoskeletal: No lower extremity tenderness nor edema.  No joint effusions. Neurologic:  Normal speech and language. No gross focal neurologic deficits are appreciated. No gait instability. Skin:  Skin is warm, dry and intact. No rash noted. Psychiatric: Mood and affect are normal. Speech and behavior are normal.  ____________________________________________   LABS (all labs ordered are listed, but only abnormal results are displayed)  Labs Reviewed  BASIC METABOLIC PANEL - Abnormal; Notable for the following components:      Result Value   Potassium 2.8 (*)    Glucose, Bld 136 (*)    All other components within normal limits  CBC  TROPONIN I (HIGH SENSITIVITY)  TROPONIN I (HIGH SENSITIVITY)   ____________________________________________  EKG  ED ECG REPORT I, Aaban Griep J, the attending physician, personally viewed and interpreted this ECG.   Date: 12/18/2020  EKG Time: 0155  Rate: 101  Rhythm: sinus tachycardia  Axis: Normal  Intervals:none  ST&T Change: Nonspecific  ____________________________________________  RADIOLOGY I, Michela Herst J, personally viewed and evaluated these images (plain radiographs) as part of my medical decision making, as well as reviewing the written report by the radiologist.  ED MD interpretation: Appearance of atypical/viral infection  Official radiology report(s): DG Chest 2 View  Result Date: 12/17/2020 CLINICAL DATA:  shortness of breath.  COVID-19 positive EXAM: CHEST - 2 VIEW COMPARISON:  None. FINDINGS: The heart size and mediastinal contours are within normal limits. Mildly increased interstitial markings throughout both lungs.  No focal airspace consolidation. No pleural effusion or pneumothorax. The visualized skeletal structures are unremarkable. IMPRESSION: Mildly increased interstitial markings throughout both lungs, which may reflect bronchitic type lung changes versus developing atypical/viral infection. No focal airspace consolidation. Electronically Signed   By: Davina Poke D.O.   On: 12/17/2020 19:12    ____________________________________________   PROCEDURES  Procedure(s) performed (including Critical Care):  Procedures   ____________________________________________   INITIAL IMPRESSION / ASSESSMENT AND PLAN / ED COURSE  As part of my medical decision making, I reviewed the following data within the [place] notes reviewed and incorporated, Labs reviewed, EKG interpreted, Old chart reviewed, Radiograph reviewed, and Notes from prior ED visits     70 year old female with COVID-19 status post Paxlovid presenting with shortness of breath while working outside in the heat. Differential includes, but is not limited to, viral syndrome, bronchitis including COPD exacerbation, pneumonia, reactive airway disease including asthma, CHF including exacerbation with or without pulmonary/interstitial edema, pneumothorax, ACS, thoracic trauma, and pulmonary embolism.   Chest x-ray consistent with appearance of COVID-19 infection.  Will obtain lab work and ambulation trial.  Ideally would like to obtain rapid antigen COVID test as PCR would be positive in light of recent infection.  This would be to determine rebound infection after taking  Paxlovid. however, we are out of rapid antigen COVID test at this facility.   Clinical Course as of 12/18/20 0448  Tue Dec 18, 2020  0253 Patient ambulated without tachypnea nor hypoxia. [JS]  0310 Hypokalemia noted.  Will replete in the ED.  Awaiting repeat troponin. [JS]  [id number] Updated patient on repeat troponin results.  Will discharge home with  prescription for albuterol MDI to use as needed.  Strict return precautions given.  Patient verbalizes understanding agrees with plan of care. [JS]    Clinical Course User Index [JS] Paulette Blanch, MD     ____________________________________________   FINAL CLINICAL IMPRESSION(S) / ED DIAGNOSES  Final diagnoses:  Shortness of breath  Hypokalemia     ED Discharge Orders          Ordered    albuterol (VENTOLIN HFA) 108 (90 Base) MCG/ACT inhaler  Every 4 hours PRN        12/18/20 0439             Note:  This document was prepared using Dragon voice recognition software and may include  unintentional dictation errors.    Paulette Blanch, MD 12/18/20 [phone]

## 2020-12-18 NOTE — Discharge Instructions (Addendum)
Use Albuterol inhaler 2 puffs every 4 hours as needed for difficulty breathing.  Return to the ER for worsening symptoms, persistent vomiting, difficulty breathing or other concerns.

## 2021-01-15 DIAGNOSIS — E119 Type 2 diabetes mellitus without complications: Secondary | ICD-10-CM | POA: Diagnosis not present

## 2021-01-15 LAB — HM DIABETES EYE EXAM

## 2021-01-28 ENCOUNTER — Other Ambulatory Visit: Payer: Self-pay

## 2021-01-28 ENCOUNTER — Ambulatory Visit (INDEPENDENT_AMBULATORY_CARE_PROVIDER_SITE_OTHER): Payer: Medicare HMO | Admitting: Internal Medicine

## 2021-01-28 ENCOUNTER — Encounter: Payer: Self-pay | Admitting: Internal Medicine

## 2021-01-28 VITALS — BP 122/78 | HR 87 | Temp 98.1°F | Ht 62.0 in | Wt 164.0 lb

## 2021-01-28 DIAGNOSIS — Z23 Encounter for immunization: Secondary | ICD-10-CM

## 2021-01-28 DIAGNOSIS — E1169 Type 2 diabetes mellitus with other specified complication: Secondary | ICD-10-CM | POA: Diagnosis not present

## 2021-01-28 DIAGNOSIS — E118 Type 2 diabetes mellitus with unspecified complications: Secondary | ICD-10-CM

## 2021-01-28 DIAGNOSIS — E785 Hyperlipidemia, unspecified: Secondary | ICD-10-CM

## 2021-01-28 DIAGNOSIS — I10 Essential (primary) hypertension: Secondary | ICD-10-CM | POA: Diagnosis not present

## 2021-01-28 LAB — POCT GLYCOSYLATED HEMOGLOBIN (HGB A1C): Hemoglobin A1C: 6.7 % — AB (ref 4.0–5.6)

## 2021-01-28 MED ORDER — LOSARTAN POTASSIUM 50 MG PO TABS: 50.0000 mg | ORAL_TABLET | Freq: Every day | ORAL | 1 refills | Status: DC

## 2021-01-28 NOTE — Progress Notes (Signed)
Date:  01/28/2021   Name:  Kathy Dougherty   DOB:  [birth year]   MRN:  [id number]   Chief Complaint: Diabetes (Foot exam ) and Flu Vaccine She wants to take a supplement called Jani Gravel. Diabetes She presents for her follow-up diabetic visit. She has type 2 diabetes mellitus. Her disease course has been stable. Pertinent negatives for hypoglycemia include no headaches or tremors. Pertinent negatives for diabetes include no chest pain, no fatigue, no polydipsia and no polyuria. Pertinent negatives for diabetic complications include no CVA. Current diabetic treatment includes oral agent (monotherapy) (glimepiride; farxiga added in April). She is compliant with treatment all of the time. She is following a generally healthy diet. An ACE inhibitor/angiotensin II receptor blocker is being taken.  Hypertension This is a chronic problem. The problem is controlled. Pertinent negatives include no chest pain, headaches, palpitations or shortness of breath. Risk factors for coronary artery disease include diabetes mellitus. Past treatments include angiotensin blockers and calcium channel blockers. There is no history of kidney disease, CAD/MI or CVA.  Hyperlipidemia This is a chronic problem. The problem is controlled. Pertinent negatives include no chest pain or shortness of breath. Current antihyperlipidemic treatment includes statins. The current treatment provides significant improvement of lipids.   Lab Results  Component Value Date   CREATININE 0.64 12/18/2020   BUN 14 12/18/2020   NA 138 12/18/2020   K 2.8 (L) 12/18/2020   CL 98 12/18/2020   CO2 29 12/18/2020   Lab Results  Component Value Date   CHOL 101 04/18/2020   HDL 42 04/18/2020   LDLCALC 47 04/18/2020   TRIG 48 04/18/2020   CHOLHDL 2.4 04/18/2020   Lab Results  Component Value Date   TSH 1.790 04/18/2020   Lab Results  Component Value Date   HGBA1C 6.7 (A) 01/28/2021   Lab Results  Component Value Date   WBC 7.0  12/18/2020   HGB 13.4 12/18/2020   HCT 40.3 12/18/2020   MCV 85.2 12/18/2020   PLT 228 12/18/2020   Lab Results  Component Value Date   ALT 20 04/18/2020   AST 25 04/18/2020   ALKPHOS 79 04/18/2020   BILITOT 0.6 04/18/2020     Review of Systems  Constitutional:  Negative for appetite change, fatigue, fever and unexpected weight change.  HENT:  Negative for tinnitus and trouble swallowing.   Eyes:  Negative for visual disturbance.  Respiratory:  Negative for cough, chest tightness and shortness of breath.   Cardiovascular:  Negative for chest pain, palpitations and leg swelling.  Gastrointestinal:  Negative for abdominal pain.  Endocrine: Negative for polydipsia and polyuria.  Genitourinary:  Negative for dysuria and hematuria.  Musculoskeletal:  Negative for arthralgias.  Neurological:  Negative for tremors, numbness and headaches.  Psychiatric/Behavioral:  Negative for dysphoric mood.    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   Mass of chest wall, left 04/18/2020   Porokeratosis 08/05/2019   Hyperlipidemia associated with type 2 diabetes mellitus ([place]) 03/26/2017   Iritis of left eye 03/26/2017   Pre-ulcerative calluses 03/26/2017   Arthritis of right knee 01/15/2016   DM (diabetes mellitus), type 2 with complications ([place]) 50/53/9767   Abnormal LFTs 03/15/2015   Essential (primary) hypertension 03/15/2015   Gastro-esophageal reflux disease without esophagitis 03/15/2015   Allergic rhinitis, seasonal 03/15/2015    Allergies  Allergen Reactions   Ace Inhibitors Cough   Metformin And Related Diarrhea   Penicillins Rash  Sulfa Antibiotics Rash    Past Surgical History:  Procedure Laterality Date   CATARACT EXTRACTION Right 08/2014   CATARACT EXTRACTION Left 04/2016   HAMMER TOE SURGERY     PARTIAL HYSTERECTOMY     fibroids    Social History   Tobacco Use   Smoking status: Never   Smokeless tobacco: Never   Vaping Use   Vaping Use: Never used  Substance Use Topics   Alcohol use: No    Alcohol/week: 0.0 standard drinks   Drug use: No     Medication list has been reviewed and updated.  Current Meds  Medication Sig   acetaminophen (TYLENOL) 500 MG tablet Take 500 mg by mouth every 6 (six) hours as needed.   amLODipine (NORVASC) 2.5 MG tablet TAKE 1 TABLET BY MOUTH EVERY DAY   Ascorbic Acid (VITAMIN C PO) Take by mouth.   B Complex Vitamins (B COMPLEX PO) Take by mouth daily.   blood glucose meter kit and supplies KIT Dispense based on patient and insurance preference. Test BS bid   Cholecalciferol (VITAMIN D3 PO) Take by mouth.   dapagliflozin propanediol (FARXIGA) 5 MG TABS tablet Take 1 tablet (5 mg total) by mouth daily before breakfast.   docusate sodium (COLACE) 100 MG capsule Take 100 mg by mouth 2 (two) times daily as needed.   glimepiride (AMARYL) 4 MG tablet TAKE 1 TABLET BY MOUTH DAILY BEFORE BREAKFAST.   hydrochlorothiazide (HYDRODIURIL) 25 MG tablet Take 1 tablet (25 mg total) by mouth daily.   ibuprofen (ADVIL,MOTRIN) 600 MG tablet TAKE 1 TABLET (600 MG TOTAL) BY MOUTH EVERY 8 (EIGHT) HOURS AS NEEDED.   mometasone (NASONEX) 50 MCG/ACT nasal spray Place 2 sprays into the nose daily.   Multiple Vitamin (MULTIVITAMIN PO) Take by mouth.   Multiple Vitamins-Minerals (ZINC PO) Take by mouth.   omeprazole (PRILOSEC) 40 MG capsule TAKE 1 CAPSULE BY MOUTH EVERY DAY   OneTouch Delica Lancets 76P MISC TEST TWICE DAILY   ONETOUCH ULTRA test strip TEST TWICE DAILY   simvastatin (ZOCOR) 10 MG tablet TAKE 1 TABLET BY MOUTH EVERYDAY AT BEDTIME   triamcinolone (KENALOG) 0.1 % Apply topically.   [DISCONTINUED] albuterol (VENTOLIN HFA) 108 (90 Base) MCG/ACT inhaler Inhale 2 puffs into the lungs every 4 (four) hours as needed for wheezing or shortness of breath.   [DISCONTINUED] levocetirizine (XYZAL) 5 MG tablet TAKE 1 TABLET BY MOUTH EVERY DAY IN THE EVENING   [DISCONTINUED] losartan  (COZAAR) 50 MG tablet TAKE 1 TABLET BY MOUTH EVERY DAY    PHQ 2/9 Scores 01/28/2021 08/20/2020 08/03/2020 07/30/2020  PHQ - 2 Score 0 0 0 0  PHQ- 9 Score 5 0 0 2    GAD 7 : Generalized Anxiety Score 01/28/2021 08/20/2020 08/03/2020 07/30/2020  Nervous, Anxious, on Edge 0 0 0 0  Control/stop worrying 0 0 0 0  Worry too much - different things 2 0 0 0  Trouble relaxing 0 0 0 0  Restless 0 0 0 0  Easily annoyed or irritable 0 0 0 0  Afraid - awful might happen 0 0 0 0  Total GAD 7 Score 2 0 0 0  Anxiety Difficulty - - - -    BP Readings from Last 3 Encounters:  01/28/21 122/78  12/18/20 136/86  08/20/20 136/80    Physical Exam Vitals and nursing note reviewed.  Constitutional:      General: She is not in acute distress.    Appearance: She is well-developed.  HENT:  Head: Normocephalic and atraumatic.  Cardiovascular:     Rate and Rhythm: Normal rate and regular rhythm.     Pulses: Normal pulses.     Heart sounds: No murmur heard. Pulmonary:     Effort: Pulmonary effort is normal. No respiratory distress.     Breath sounds: No wheezing or rhonchi.  Musculoskeletal:     Cervical back: Normal range of motion.     Right lower leg: No edema.     Left lower leg: No edema.  Lymphadenopathy:     Cervical: No cervical adenopathy.  Skin:    General: Skin is warm and dry.     Capillary Refill: Capillary refill takes less than 2 seconds.     Findings: No rash.     Comments: Hypertrophic skin on balls of both feet R>L  Neurological:     General: No focal deficit present.     Mental Status: She is alert and oriented to person, place, and time.  Psychiatric:        Mood and Affect: Mood normal.        Behavior: Behavior normal.    Wt Readings from Last 3 Encounters:  01/28/21 164 lb (74.4 kg)  12/17/20 171 lb 15.3 oz (78 kg)  08/20/20 174 lb (78.9 kg)    BP 122/78   Pulse 87   Temp 98.1 F (36.7 C) (Oral)   Ht _0  (1.575 m)   Wt 164 lb (74.4 kg)   SpO2 97%   BMI 30.00  kg/m   Assessment and Plan: 1. Essential (primary) hypertension Clinically stable exam with well controlled BP. Tolerating medications without side effects at this time. Pt to continue current regimen and low sodium diet; benefits of regular exercise as able discussed.  2. DM (diabetes mellitus), type 2 with complications ([place]) Clinically stable by exam and report without s/s of hypoglycemia. DM complicated by hypertension and dyslipidemia.  Doing well with weight loss on Farxiga. Tolerating medications well without side effects or other concerns. - POCT HgB A1C = 6.7 down from 7.9.  3. Hyperlipidemia associated with type 2 diabetes mellitus ([place]) Will get fasting labs at next visit. Continue Zocor in the meantime.   Partially dictated using Editor, commissioning. Any errors are unintentional.  [name], MD [facility]  01/28/2021

## 2021-01-29 ENCOUNTER — Telehealth: Payer: Self-pay

## 2021-01-29 NOTE — Progress Notes (Signed)
Patient called back and requested to make a telephone appointment. Her appointment is scheduled for 02/05/21 at 9:30 am. I completed Farxiga PAP and will mail it to patient this week.

## 2021-01-29 NOTE — Telephone Encounter (Signed)
Copied from [place] [id number]. Topic: General - Other >> Jan 29, 2021 12:28 PM Celene Kras wrote: Reason for CRM: Lauren, calling in regards to medical records request, calling stating that the pts demographic info was showing as a Female instead of female and they are needing to have this corrected before they are able to scan over medical records. Please advise.

## 2021-02-01 NOTE — Progress Notes (Signed)
Called patient to let her know that I sent out the PAP for Farxiga yesterday, Patient understood to fill it out and stated that she will bring it to her PCP office when she's finished.

## 2021-02-05 ENCOUNTER — Other Ambulatory Visit: Payer: Self-pay | Admitting: Internal Medicine

## 2021-02-05 ENCOUNTER — Telehealth: Payer: Medicare HMO

## 2021-02-05 ENCOUNTER — Telehealth: Payer: Self-pay

## 2021-02-05 DIAGNOSIS — I10 Essential (primary) hypertension: Secondary | ICD-10-CM

## 2021-02-05 NOTE — Progress Notes (Deleted)
Chronic Care Management Pharmacy Note  02/05/2021 Name:  Kathy Dougherty MRN:  [id number] DOB:  [birth year]  Summary:  Recommendations/Changes made from today's visit:  Plan:  Subjective: Kathy Dougherty is an 70 y.o. year old female who is a primary patient of [name], Jesse Sans, MD.  The CCM team was consulted for assistance with disease management and care coordination needs.    {CCMTELEPHONEFACETOFACE:21091510} for {CCMINITIALFOLLOWUPCHOICE:21091511} in response to provider referral for pharmacy case management and/or care coordination services.   Consent to Services:  {CCMCONSENTOPTIONS:25074}  Patient Care Team: [name], MD as PCP - General (Internal Medicine) Griffin Dakin (Optometry) Vladimir Faster, [facility] (Pharmacist)  Recent office visits: ***  Recent consult visits: [facility] visits: {Hospital DC Yes/No:21091515}  Objective:  Lab Results  Component Value Date   CREATININE 0.64 12/18/2020   CREATININE 0.70 04/18/2020   CREATININE 0.76 12/08/2019    Lab Results  Component Value Date   HGBA1C 6.7 (A) 01/28/2021   Last diabetic Eye exam:  Lab Results  Component Value Date/Time   HMDIABEYEEXA No Retinopathy 11/11/2019 12:00 AM    Last diabetic Foot exam: No results found for: HMDIABFOOTEX      Component Value Date/Time   CHOL 101 04/18/2020 0931   TRIG 48 04/18/2020 0931   HDL 42 04/18/2020 0931   CHOLHDL 2.4 04/18/2020 0931   LDLCALC 47 04/18/2020 0931    Hepatic Function Latest Ref Rng & Units 04/18/2020 04/18/2019 08/11/2018  Total Protein 6.0 - 8.5 g/dL 7.3 7.3 7.2  Albumin 3.8 - 4.8 g/dL 4.1 4.3 4.2  AST 0 - 40 IU/L $Remov'25 23 23  'sqkuJt$ ALT 0 - 32 IU/L $Remov'20 16 20  'UaLJvk$ Alk Phosphatase 44 - 121 IU/L 79 85 83  Total Bilirubin 0.0 - 1.2 mg/dL 0.6 0.4 0.6    Lab Results  Component Value Date/Time   TSH 1.790 04/18/2020 09:31 AM   TSH 1.680 04/18/2019 09:18 AM    CBC Latest Ref Rng & Units 12/18/2020 04/18/2020 04/18/2019  WBC 4.0 - 10.5 K/uL 7.0  6.0 5.1  Hemoglobin 12.0 - 15.0 g/dL 13.4 12.5 12.4  Hematocrit 36.0 - 46.0 % 40.3 37.0 38.0  Platelets 150 - 400 K/uL 228 221 208    No results found for: VD25OH  Clinical ASCVD: {YES/NO:21197} The ASCVD Risk score (Arnett DK, et al., 2019) failed to calculate for the following reasons:   The valid total cholesterol range is 130 to 320 mg/dL    Other: (CHADS2VASc if Afib, PHQ9 if depression, MMRC or CAT for COPD, ACT, DEXA)  Social History   Tobacco Use  Smoking Status Never  Smokeless Tobacco Never   BP Readings from Last 3 Encounters:  01/28/21 122/78  12/18/20 136/86  08/20/20 136/80   Pulse Readings from Last 3 Encounters:  01/28/21 87  12/18/20 74  08/20/20 75   Wt Readings from Last 3 Encounters:  01/28/21 164 lb (74.4 kg)  12/17/20 171 lb 15.3 oz (78 kg)  08/20/20 174 lb (78.9 kg)    Assessment: Review of patient past medical history, allergies, medications, health status, including review of consultants reports, laboratory and other test data, was performed as part of comprehensive evaluation and provision of chronic care management services.   SDOH:  (Social Determinants of Health) assessments and interventions performed:    CCM Care Plan  Allergies  Allergen Reactions   Ace Inhibitors Cough   Metformin And Related Diarrhea   Penicillins Rash   Sulfa Antibiotics Rash    Medications  Reviewed Today     Reviewed by Person, Eulis Canner, CMA (Certified Medical Assistant) on 01/28/21 at 1443  Med List Status: <None>   Medication Order Taking? Sig Documenting Provider Last Dose Status Informant  acetaminophen (TYLENOL) 500 MG tablet [id number] Yes Take 500 mg by mouth every 6 (six) hours as needed. [provider] Taking Active   amLODipine (NORVASC) 2.5 MG tablet [id number] Yes TAKE 1 TABLET BY MOUTH EVERY DAY Reubin Milan, MD Taking Active   Ascorbic Acid (VITAMIN C PO) [id number] Yes Take by mouth. [provider] Taking Active   B  Complex Vitamins (B COMPLEX PO) [id number] Yes Take by mouth daily. [provider] Taking Active   blood glucose meter kit and supplies KIT [id number] Yes Dispense based on patient and insurance preference. Test BS bid Reubin Milan, MD Taking Active   Cholecalciferol (VITAMIN D3 PO) [id number] Yes Take by mouth. [provider] Taking Active   dapagliflozin propanediol (FARXIGA) 5 MG TABS tablet [id number] Yes Take 1 tablet (5 mg total) by mouth daily before breakfast. Reubin Milan, MD Taking Active   docusate sodium (COLACE) 100 MG capsule [id number] Yes Take 100 mg by mouth 2 (two) times daily as needed. [provider] Taking Active   glimepiride (AMARYL) 4 MG tablet [id number] Yes TAKE 1 TABLET BY MOUTH DAILY BEFORE BREAKFAST. Reubin Milan, MD Taking Active   hydrochlorothiazide (HYDRODIURIL) 25 MG tablet [id number] Yes Take 1 tablet (25 mg total) by mouth daily. Reubin Milan, MD Taking Active   ibuprofen (ADVIL,MOTRIN) 600 MG tablet [id number] Yes TAKE 1 TABLET (600 MG TOTAL) BY MOUTH EVERY 8 (EIGHT) HOURS AS NEEDED. Reubin Milan, MD Taking Active   losartan (COZAAR) 50 MG tablet [id number] Yes TAKE 1 TABLET BY MOUTH EVERY DAY Reubin Milan, MD Taking Active   mometasone (NASONEX) 50 MCG/ACT nasal spray [id number] Yes Place 2 sprays into the nose daily. Reubin Milan, MD Taking Active   Multiple Vitamin (MULTIVITAMIN PO) [id number] Yes Take by mouth. [provider] Taking Active   Multiple Vitamins-Minerals (ZINC PO) [id number] Yes Take by mouth. [provider] Taking Active   omeprazole (PRILOSEC) 40 MG capsule [id number] Yes TAKE 1 CAPSULE BY MOUTH EVERY DAY Reubin Milan, MD Taking Active   OneTouch Delica Lancets 33G MISC [id number] Yes TEST TWICE DAILY Reubin Milan, MD Taking Active   [facility] ULTRA test strip [id number] Yes TEST TWICE DAILY Reubin Milan, MD Taking Active   simvastatin (ZOCOR) 10 MG tablet  [id number] Yes TAKE 1 TABLET BY MOUTH EVERYDAY AT BEDTIME Reubin Milan, MD Taking Active   triamcinolone (KENALOG) 0.1 % [id number] Yes Apply topically. [provider] Taking Active             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   Mass of chest wall, left 04/18/2020   Porokeratosis 08/05/2019   Hyperlipidemia associated with type 2 diabetes mellitus ([facility]) 03/26/2017   Iritis of left eye 03/26/2017   Pre-ulcerative calluses 03/26/2017   Arthritis of right knee 01/15/2016   DM (diabetes mellitus), type 2 with complications ([facility]) 03/15/2015   Abnormal LFTs 03/15/2015   Essential (primary) hypertension 03/15/2015   Gastro-esophageal reflux disease without esophagitis 03/15/2015   Allergic rhinitis, seasonal 03/15/2015    Immunization History  Administered Date(s) Administered   Fluad Quad(high Dose 65+) 02/18/2019, 03/13/2020, 01/28/2021   Influenza, High Dose Seasonal PF 03/23/2017, 03/22/2018  Influenza,inj,Quad PF,6+ Mos 03/19/2015, 03/20/2016   Moderna Sars-Covid-2 Vaccination 06/24/2019, 07/26/2019, 03/19/2020   Pneumococcal Conjugate-13 03/20/2016   Pneumococcal Polysaccharide-23 03/23/2017   Zoster, Live 11/05/2012    Conditions to be addressed/monitored: {CCM ASSESSMENT DISEASE OPTIONS:25047}  There are no care plans that you recently modified to display for this patient.   Medication Assistance: {MEDASSISTANCEINFO:25044}  Patient's preferred pharmacy is:  [id number] - [place], [place] [address] Phone: [phone] Fax: [phone]  [id number] - [place], [place] [address] Phone: [phone] Fax: [phone]  Uses pill box? {Yes or If no, why not?:20788} Pt endorses ***% compliance  Follow Up:  {FOLLOWUP:24991}  Plan: {CM FOLLOW UP PLAN:25073}  SIG***

## 2021-02-05 NOTE — Chronic Care Management (AMB) (Signed)
    Chronic Care Management Pharmacy Assistant   Name: Kathy Dougherty  MRN: [id number] DOB: 06/02/1950  Patient called and told me that she had to cancel the appointment this morning with Madelin Rear due to a death in the family. She stated that she would like to reschedule at a later time, Patient also mentioned that she received the Farxiga PAP I sent out and will get that filled out and sent to her PCP as soon as she can.   I cancelled the appointment and will reschedule her at a later time   Andee Poles, [place]

## 2021-02-06 ENCOUNTER — Encounter: Payer: Self-pay | Admitting: Internal Medicine

## 2021-02-07 ENCOUNTER — Other Ambulatory Visit: Payer: Self-pay | Admitting: Internal Medicine

## 2021-02-07 DIAGNOSIS — E118 Type 2 diabetes mellitus with unspecified complications: Secondary | ICD-10-CM

## 2021-02-07 NOTE — Chronic Care Management (AMB) (Signed)
Patient called to reschedule an appointment that she missed on Tuesday with Madelin Rear. The appointment is now scheduled for 02/19/21 @1pm .

## 2021-02-18 ENCOUNTER — Other Ambulatory Visit: Payer: Self-pay

## 2021-02-18 DIAGNOSIS — E118 Type 2 diabetes mellitus with unspecified complications: Secondary | ICD-10-CM

## 2021-02-18 MED ORDER — DAPAGLIFLOZIN PROPANEDIOL 5 MG PO TABS: 5.0000 mg | ORAL_TABLET | Freq: Every day | ORAL | 3 refills | Status: DC

## 2021-02-19 ENCOUNTER — Telehealth: Payer: Self-pay | Admitting: Internal Medicine

## 2021-02-19 ENCOUNTER — Telehealth: Payer: Medicare HMO

## 2021-02-19 NOTE — Telephone Encounter (Signed)
Called pt let her know she can come get some samples until we can find out the information for the [facility] pharmacist. Pt verbalized understanding.  KP

## 2021-02-19 NOTE — Telephone Encounter (Signed)
Patient called  in about  med, dapagliflozin propanediol (FARXIGA) 5 MG TABS tablet , says was told by pharmacy cost would be 400.00 and she can't afford this. Please cal back with options.

## 2021-02-25 ENCOUNTER — Other Ambulatory Visit: Payer: Self-pay | Admitting: Internal Medicine

## 2021-02-25 DIAGNOSIS — I10 Essential (primary) hypertension: Secondary | ICD-10-CM

## 2021-04-24 ENCOUNTER — Ambulatory Visit (INDEPENDENT_AMBULATORY_CARE_PROVIDER_SITE_OTHER): Payer: Medicare HMO | Admitting: Internal Medicine

## 2021-04-24 ENCOUNTER — Other Ambulatory Visit: Payer: Self-pay

## 2021-04-24 ENCOUNTER — Encounter: Payer: Self-pay | Admitting: Internal Medicine

## 2021-04-24 VITALS — BP 132/60 | HR 82 | Ht 62.0 in | Wt 160.2 lb

## 2021-04-24 DIAGNOSIS — Z1231 Encounter for screening mammogram for malignant neoplasm of breast: Secondary | ICD-10-CM

## 2021-04-24 DIAGNOSIS — Z0000 Encounter for general adult medical examination without abnormal findings: Secondary | ICD-10-CM

## 2021-04-24 DIAGNOSIS — E118 Type 2 diabetes mellitus with unspecified complications: Secondary | ICD-10-CM

## 2021-04-24 DIAGNOSIS — Z13820 Encounter for screening for osteoporosis: Secondary | ICD-10-CM | POA: Diagnosis not present

## 2021-04-24 DIAGNOSIS — E785 Hyperlipidemia, unspecified: Secondary | ICD-10-CM

## 2021-04-24 DIAGNOSIS — E1169 Type 2 diabetes mellitus with other specified complication: Secondary | ICD-10-CM

## 2021-04-24 DIAGNOSIS — I10 Essential (primary) hypertension: Secondary | ICD-10-CM

## 2021-04-24 LAB — POCT URINALYSIS DIPSTICK
Bilirubin, UA: NEGATIVE
Blood, UA: NEGATIVE
Glucose, UA: POSITIVE — AB
Ketones, UA: NEGATIVE
Leukocytes, UA: NEGATIVE
Nitrite, UA: NEGATIVE
Protein, UA: NEGATIVE
Spec Grav, UA: 1.01 (ref 1.010–1.025)
Urobilinogen, UA: 0.2 E.U./dL
pH, UA: 6 (ref 5.0–8.0)

## 2021-04-24 MED ORDER — GLIMEPIRIDE 4 MG PO TABS: 4.0000 mg | ORAL_TABLET | Freq: Every day | ORAL | 1 refills | Status: DC

## 2021-04-24 MED ORDER — HYDROCHLOROTHIAZIDE 25 MG PO TABS: 25.0000 mg | ORAL_TABLET | Freq: Every day | ORAL | 0 refills | Status: DC

## 2021-04-24 MED ORDER — SIMVASTATIN 10 MG PO TABS: ORAL_TABLET | ORAL | 3 refills | Status: DC

## 2021-04-24 NOTE — Patient Instructions (Signed)
Team Member Role and Air traffic controller Info Address Start End Comments  [employer], [place] [address], [place] Consulting Physician (Podiatry) Phone: [phone] Fax: [address]  [phone] 04/24/2021 - -

## 2021-04-24 NOTE — Progress Notes (Signed)
Date:  04/24/2021   Name:  Kathy Dougherty   DOB:  Sep 24, 1950   MRN:  [id number]   Chief Complaint: Annual Exam (Breast Exam.) Kathy Dougherty is a 70 y.o. female who presents today for her Complete Annual Exam. She feels well. She reports exercising - some. She reports she is sleeping fairly well. Breast complaints- Tender Under Left Armpit.  Mammogram: 04/2020 DEXA: 08/2017 osteopenia Pap smear: discontinued Colonoscopy: 2014  Immunization History  Administered Date(s) Administered   Fluad Quad(high Dose 65+) 02/18/2019, 03/13/2020, 01/28/2021   Influenza, High Dose Seasonal PF 03/23/2017, 03/22/2018   Influenza,inj,Quad PF,6+ Mos 03/19/2015, 03/20/2016   Moderna Covid-19 Vaccine Bivalent Booster 73yr & up 03/01/2021   Moderna Sars-Covid-2 Vaccination 06/24/2019, 07/26/2019, 03/19/2020   Pneumococcal Conjugate-13 03/20/2016   Pneumococcal Polysaccharide-23 03/23/2017   Zoster, Live 11/05/2012    Diabetes She presents for her follow-up diabetic visit. She has type 2 diabetes mellitus. Her disease course has been stable. Pertinent negatives for hypoglycemia include no dizziness, headaches, nervousness/anxiousness or tremors. Pertinent negatives for diabetes include no chest pain, no fatigue, no polydipsia and no polyuria. Symptoms are stable. Pertinent negatives for diabetic complications include no CVA. Current diabetic treatment includes oral agent (dual therapy) (farxiga and glimepiride). An ACE inhibitor/angiotensin II receptor blocker is being taken. Eye exam is current.  Hyperlipidemia This is a chronic problem. The problem is controlled. Pertinent negatives include no chest pain or shortness of breath. Current antihyperlipidemic treatment includes statins. The current treatment provides significant improvement of lipids.  Hypertension This is a chronic problem. The problem is controlled. Pertinent negatives include no chest pain, headaches, palpitations or shortness of breath.  Past treatments include diuretics, calcium channel blockers and angiotensin blockers. The current treatment provides significant improvement. There are no compliance problems.  There is no history of kidney disease, CAD/MI or CVA.   Lab Results  Component Value Date   NA 138 12/18/2020   K 2.8 (L) 12/18/2020   CO2 29 12/18/2020   GLUCOSE 136 (H) 12/18/2020   BUN 14 12/18/2020   CREATININE 0.64 12/18/2020   CALCIUM 9.6 12/18/2020   GFRNONAA >60 12/18/2020   Lab Results  Component Value Date   CHOL 101 04/18/2020   HDL 42 04/18/2020   LDLCALC 47 04/18/2020   TRIG 48 04/18/2020   CHOLHDL 2.4 04/18/2020   Lab Results  Component Value Date   TSH 1.790 04/18/2020   Lab Results  Component Value Date   HGBA1C 6.7 (A) 01/28/2021   Lab Results  Component Value Date   WBC 7.0 12/18/2020   HGB 13.4 12/18/2020   HCT 40.3 12/18/2020   MCV 85.2 12/18/2020   PLT 228 12/18/2020   Lab Results  Component Value Date   ALT 20 04/18/2020   AST 25 04/18/2020   ALKPHOS 79 04/18/2020   BILITOT 0.6 04/18/2020   No results found for: 25OHVITD2, 25OHVITD3, VD25OH   Review of Systems  Constitutional:  Negative for chills, fatigue and fever.  HENT:  Negative for congestion, hearing loss, tinnitus, trouble swallowing and voice change.   Eyes:  Negative for visual disturbance.  Respiratory:  Negative for cough, chest tightness, shortness of breath and wheezing.   Cardiovascular:  Negative for chest pain, palpitations and leg swelling.  Gastrointestinal:  Negative for abdominal pain, constipation, diarrhea and vomiting.  Endocrine: Negative for polydipsia and polyuria.  Genitourinary:  Negative for dysuria, frequency, genital sores, vaginal bleeding and vaginal discharge.  Musculoskeletal:  Negative for arthralgias, gait problem and joint swelling.  Skin:  Negative for color change and rash.  Neurological:  Negative for dizziness, tremors, light-headedness and headaches.  Hematological:   Negative for adenopathy. Does not bruise/bleed easily.  Psychiatric/Behavioral:  Negative for dysphoric mood and sleep disturbance. The patient is not nervous/anxious.    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   Lipoma of lateral chest wall 04/18/2020   Porokeratosis 08/05/2019   Hyperlipidemia associated with type 2 diabetes mellitus ([place]) 03/26/2017   Iritis of left eye 03/26/2017   Pre-ulcerative calluses 03/26/2017   Arthritis of right knee 01/15/2016   DM (diabetes mellitus), type 2 with complications ([place]) 14/48/1856   Abnormal LFTs 03/15/2015   Essential (primary) hypertension 03/15/2015   Gastro-esophageal reflux disease without esophagitis 03/15/2015   Allergic rhinitis, seasonal 03/15/2015    Allergies  Allergen Reactions   Ace Inhibitors Cough   Metformin And Related Diarrhea   Penicillins Rash   Sulfa Antibiotics Rash    Past Surgical History:  Procedure Laterality Date   CATARACT EXTRACTION Right 08/2014   CATARACT EXTRACTION Left 04/2016   HAMMER TOE SURGERY     PARTIAL HYSTERECTOMY     fibroids    Social History   Tobacco Use   Smoking status: Never   Smokeless tobacco: Never  Vaping Use   Vaping Use: Never used  Substance Use Topics   Alcohol use: No    Alcohol/week: 0.0 standard drinks   Drug use: No     Medication list has been reviewed and updated.  Current Meds  Medication Sig   acetaminophen (TYLENOL) 500 MG tablet Take 500 mg by mouth every 6 (six) hours as needed.   amLODipine (NORVASC) 2.5 MG tablet TAKE 1 TABLET BY MOUTH EVERY DAY   Ascorbic Acid (VITAMIN C PO) Take by mouth.   B Complex Vitamins (B COMPLEX PO) Take by mouth daily.   blood glucose meter kit and supplies KIT Dispense based on patient and insurance preference. Test BS bid   Cholecalciferol (VITAMIN D3 PO) Take by mouth.   dapagliflozin propanediol (FARXIGA) 5 MG TABS tablet Take 1 tablet (5 mg total) by mouth daily before  breakfast.   docusate sodium (COLACE) 100 MG capsule Take 100 mg by mouth 2 (two) times daily as needed.   glimepiride (AMARYL) 4 MG tablet TAKE 1 TABLET BY MOUTH DAILY BEFORE BREAKFAST.   hydrochlorothiazide (HYDRODIURIL) 25 MG tablet TAKE 1 TABLET (25 MG TOTAL) BY MOUTH DAILY.   ibuprofen (ADVIL,MOTRIN) 600 MG tablet TAKE 1 TABLET (600 MG TOTAL) BY MOUTH EVERY 8 (EIGHT) HOURS AS NEEDED.   losartan (COZAAR) 50 MG tablet Take 1 tablet (50 mg total) by mouth daily.   mometasone (NASONEX) 50 MCG/ACT nasal spray Place 2 sprays into the nose daily.   Multiple Vitamin (MULTIVITAMIN PO) Take by mouth.   Multiple Vitamins-Minerals (ZINC PO) Take by mouth.   omeprazole (PRILOSEC) 40 MG capsule TAKE 1 CAPSULE BY MOUTH EVERY DAY   OneTouch Delica Lancets 31S MISC TEST TWICE DAILY   ONETOUCH ULTRA test strip TEST TWICE DAILY   simvastatin (ZOCOR) 10 MG tablet TAKE 1 TABLET BY MOUTH EVERYDAY AT BEDTIME   triamcinolone (KENALOG) 0.1 % Apply topically.    PHQ 2/9 Scores 04/24/2021 01/28/2021 08/20/2020 08/03/2020  PHQ - 2 Score 0 0 0 0  PHQ- 9 Score 0 5 0 0    GAD 7 : Generalized Anxiety Score 04/24/2021 01/28/2021 08/20/2020 08/03/2020  Nervous, Anxious, on Edge 0 0 0 0  Control/stop worrying 0 0 0 0  Worry too much - different things 0 2 0 0  Trouble relaxing 0 0 0 0  Restless 0 0 0 0  Easily annoyed or irritable 0 0 0 0  Afraid - awful might happen 0 0 0 0  Total GAD 7 Score 0 2 0 0  Anxiety Difficulty Not difficult at all - - -    BP Readings from Last 3 Encounters:  04/24/21 132/60  01/28/21 122/78  12/18/20 136/86    Physical Exam Vitals and nursing note reviewed.  Constitutional:      General: She is not in acute distress.    Appearance: She is well-developed.  HENT:     Head: Normocephalic and atraumatic.     Right Ear: Tympanic membrane and ear canal normal.     Left Ear: Tympanic membrane and ear canal normal.     Nose:     Right Sinus: No maxillary sinus tenderness.     Left  Sinus: No maxillary sinus tenderness.  Eyes:     General: No scleral icterus.       Right eye: No discharge.        Left eye: No discharge.     Conjunctiva/sclera: Conjunctivae normal.  Neck:     Thyroid: No thyromegaly.     Vascular: No carotid bruit.  Cardiovascular:     Rate and Rhythm: Normal rate and regular rhythm.     Pulses: Normal pulses.     Heart sounds: Normal heart sounds.  Pulmonary:     Effort: Pulmonary effort is normal. No respiratory distress.     Breath sounds: No wheezing.  Chest:  Breasts:    Right: No mass, nipple discharge, skin change or tenderness.     Left: No mass, nipple discharge, skin change or tenderness.       Comments: Soft mobile mass c/w lipoma Abdominal:     General: Bowel sounds are normal.     Palpations: Abdomen is soft.     Tenderness: There is no abdominal tenderness.  Musculoskeletal:     Cervical back: Normal range of motion. No erythema.     Right lower leg: No edema.     Left lower leg: No edema.  Lymphadenopathy:     Cervical: No cervical adenopathy.  Skin:    General: Skin is warm and dry.     Findings: No rash.  Neurological:     Mental Status: She is alert and oriented to person, place, and time.     Cranial Nerves: No cranial nerve deficit.     Sensory: No sensory deficit.     Deep Tendon Reflexes: Reflexes are normal and symmetric.  Psychiatric:        Attention and Perception: Attention normal.        Mood and Affect: Mood normal.    Wt Readings from Last 3 Encounters:  04/24/21 160 lb 3.2 oz (72.7 kg)  01/28/21 164 lb (74.4 kg)  12/17/20 171 lb 15.3 oz (78 kg)    BP 132/60   Pulse 82   Ht _0  (1.575 m)   Wt 160 lb 3.2 oz (72.7 kg)   SpO2 96%   BMI 29.30 kg/m   Assessment and Plan: 1. Annual physical exam Normal exam Up to date on screenings and immunizations.  2. Encounter for screening mammogram for breast cancer Scheduled at [facility] next week  3. Encounter for screening for osteoporosis Due for  DEXA - schedule at [facility]  4. Essential (primary) hypertension Clinically stable exam with well controlled BP. Tolerating medications without side effects at this time. Pt to continue current regimen and low sodium diet; benefits of regular exercise as able discussed. - CBC with Differential/Platelet - Comprehensive metabolic panel - POCT urinalysis dipstick - hydrochlorothiazide (HYDRODIURIL) 25 MG tablet; Take 1 tablet (25 mg total) by mouth daily.  Dispense: 90 tablet; Refill: 0  5. DM (diabetes mellitus), type 2 with complications ([place]) Clinically stable by exam and report without s/s of hypoglycemia. DM complicated by hypertension and dyslipidemia. Tolerating medications well without side effects or other concerns. Consult Podiatry for foot care - Hemoglobin A1c - TSH - glimepiride (AMARYL) 4 MG tablet; Take 1 tablet (4 mg total) by mouth daily before breakfast.  Dispense: 90 tablet; Refill: 1 - Vitamin B12  6. Hyperlipidemia associated with type 2 diabetes mellitus ([place]) Tolerating statin medication without side effects at this time LDL is at goal of < 70 on current dose Continue same therapy without change at this time. - Lipid panel - simvastatin (ZOCOR) 10 MG tablet; TAKE 1 TABLET BY MOUTH EVERYDAY AT BEDTIME  Dispense: 90 tablet; Refill: 3   Partially dictated using Editor, commissioning. Any errors are unintentional.  [name], MD [facility]  04/24/2021

## 2021-04-25 LAB — CBC WITH DIFFERENTIAL/PLATELET
Basophils Absolute: 0 10*3/uL (ref 0.0–0.2)
Basos: 1 %
EOS (ABSOLUTE): 0.1 10*3/uL (ref 0.0–0.4)
Eos: 2 %
Hematocrit: 38.8 % (ref 34.0–46.6)
Hemoglobin: 12.8 g/dL (ref 11.1–15.9)
Immature Grans (Abs): 0 10*3/uL (ref 0.0–0.1)
Immature Granulocytes: 1 %
Lymphocytes Absolute: 1.4 10*3/uL (ref 0.7–3.1)
Lymphs: 33 %
MCH: 27.9 pg (ref 26.6–33.0)
MCHC: 33 g/dL (ref 31.5–35.7)
MCV: 85 fL (ref 79–97)
Monocytes Absolute: 0.4 10*3/uL (ref 0.1–0.9)
Monocytes: 10 %
Neutrophils Absolute: 2.2 10*3/uL (ref 1.4–7.0)
Neutrophils: 53 %
Platelets: 219 10*3/uL (ref 150–450)
RBC: 4.59 x10E6/uL (ref 3.77–5.28)
RDW: 12.8 % (ref 11.7–15.4)
WBC: 4.1 10*3/uL (ref 3.4–10.8)

## 2021-04-25 LAB — LIPID PANEL
Chol/HDL Ratio: 2.7 ratio (ref 0.0–4.4)
Cholesterol, Total: 125 mg/dL (ref 100–199)
HDL: 47 mg/dL (ref >39)
LDL Chol Calc (NIH): 66 mg/dL (ref 0–99)
Triglycerides: 55 mg/dL (ref 0–149)
VLDL Cholesterol Cal: 12 mg/dL (ref 5–40)

## 2021-04-25 LAB — COMPREHENSIVE METABOLIC PANEL
ALT: 18 IU/L (ref 0–32)
AST: 22 IU/L (ref 0–40)
Albumin/Globulin Ratio: 1.5 (ref 1.2–2.2)
Albumin: 4.4 g/dL (ref 3.8–4.8)
Alkaline Phosphatase: 64 IU/L (ref 44–121)
BUN/Creatinine Ratio: 21 (ref 12–28)
BUN: 15 mg/dL (ref 8–27)
Bilirubin Total: 0.4 mg/dL (ref 0.0–1.2)
CO2: 26 mmol/L (ref 20–29)
Calcium: 9.6 mg/dL (ref 8.7–10.3)
Chloride: 102 mmol/L (ref 96–106)
Creatinine, Ser: 0.73 mg/dL (ref 0.57–1.00)
Globulin, Total: 3 g/dL (ref 1.5–4.5)
Glucose: 121 mg/dL — ABNORMAL HIGH (ref 70–99)
Potassium: 3.4 mmol/L — ABNORMAL LOW (ref 3.5–5.2)
Sodium: 144 mmol/L (ref 134–144)
Total Protein: 7.4 g/dL (ref 6.0–8.5)
eGFR: 88 mL/min/{1.73_m2} (ref >59)

## 2021-04-25 LAB — TSH: TSH: 1.53 u[IU]/mL (ref 0.450–4.500)

## 2021-04-25 LAB — HEMOGLOBIN A1C
Est. average glucose Bld gHb Est-mCnc: 137 mg/dL
Hgb A1c MFr Bld: 6.4 % — ABNORMAL HIGH (ref 4.8–5.6)

## 2021-04-25 LAB — VITAMIN B12: Vitamin B-12: 378 pg/mL (ref 232–1245)

## 2021-05-01 ENCOUNTER — Ambulatory Visit (INDEPENDENT_AMBULATORY_CARE_PROVIDER_SITE_OTHER): Payer: Medicare HMO

## 2021-05-01 DIAGNOSIS — Z0000 Encounter for general adult medical examination without abnormal findings: Secondary | ICD-10-CM | POA: Diagnosis not present

## 2021-05-01 NOTE — Progress Notes (Signed)
Subjective:   Lessly Stigler is a 70 y.o. female who presents for Medicare Annual (Subsequent) preventive examination.  Virtual Visit via Telephone Note  I connected with  Rulon Abide on 05/01/21 at  9:20 AM EST by telephone and verified that I am speaking with the correct person using two identifiers.  Location: Patient: home Provider: [facility] Persons participating in the virtual visit: patient/Nurse Health Advisor   I discussed the limitations, risks, security and privacy concerns of performing an evaluation and management service by telephone and the availability of in person appointments. The patient expressed understanding and agreed to proceed.  Interactive audio and video telecommunications were attempted between this nurse and patient, however failed, due to patient having technical difficulties OR patient did not have access to video capability.  We continued and completed visit with audio only.  Some vital signs may be absent or patient reported.   Reather Littler, LPN   Review of Systems     Cardiac Risk Factors include: advanced age (>87men, >57 women);diabetes mellitus;dyslipidemia;hypertension     Objective:    There were no vitals filed for this visit. There is no height or weight on file to calculate BMI.  Advanced Directives 05/01/2021 05/28/2020 04/30/2020 04/25/2019 04/19/2018 03/23/2017 06/19/2015  Does Patient Have a Medical Advance Directive? No Yes No No No No No  Type of Advance Directive - Living will - - - - -  Does patient want to make changes to medical advance directive? - No - Patient declined - - - - -  Would patient like information on creating a medical advance directive? Yes (MAU/Ambulatory/Procedural Areas - Information given) No - Patient declined No - Patient declined Yes (MAU/Ambulatory/Procedural Areas - Information given) No - Patient declined Yes (MAU/Ambulatory/Procedural Areas - Information given) -    Current Medications  (verified) Outpatient Encounter Medications as of 05/01/2021  Medication Sig   acetaminophen (TYLENOL) 500 MG tablet Take 500 mg by mouth every 6 (six) hours as needed.   amLODipine (NORVASC) 2.5 MG tablet TAKE 1 TABLET BY MOUTH EVERY DAY   blood glucose meter kit and supplies KIT Dispense based on patient and insurance preference. Test BS bid   dapagliflozin propanediol (FARXIGA) 5 MG TABS tablet Take 1 tablet (5 mg total) by mouth daily before breakfast.   docusate sodium (COLACE) 100 MG capsule Take 100 mg by mouth 2 (two) times daily as needed.   glimepiride (AMARYL) 4 MG tablet Take 1 tablet (4 mg total) by mouth daily before breakfast.   hydrochlorothiazide (HYDRODIURIL) 25 MG tablet Take 1 tablet (25 mg total) by mouth daily.   ibuprofen (ADVIL,MOTRIN) 600 MG tablet TAKE 1 TABLET (600 MG TOTAL) BY MOUTH EVERY 8 (EIGHT) HOURS AS NEEDED.   losartan (COZAAR) 50 MG tablet Take 1 tablet (50 mg total) by mouth daily.   mometasone (NASONEX) 50 MCG/ACT nasal spray Place 2 sprays into the nose daily.   omeprazole (PRILOSEC) 40 MG capsule TAKE 1 CAPSULE BY MOUTH EVERY DAY   OneTouch Delica Lancets 33G MISC TEST TWICE DAILY   ONETOUCH ULTRA test strip TEST TWICE DAILY   simvastatin (ZOCOR) 10 MG tablet TAKE 1 TABLET BY MOUTH EVERYDAY AT BEDTIME   triamcinolone (KENALOG) 0.1 % Apply topically.   Ascorbic Acid (VITAMIN C PO) Take by mouth. (Patient not taking: Reported on 05/01/2021)   B Complex Vitamins (B COMPLEX PO) Take by mouth daily. (Patient not taking: Reported on 05/01/2021)   Cholecalciferol (VITAMIN D3 PO) Take by mouth. (Patient not taking: Reported  on 05/01/2021)   Multiple Vitamin (MULTIVITAMIN PO) Take by mouth. (Patient not taking: Reported on 05/01/2021)   [DISCONTINUED] amLODipine (NORVASC) 2.5 MG tablet Take 1 tablet (2.5 mg total) by mouth daily.   [DISCONTINUED] glimepiride (AMARYL) 4 MG tablet Take 1 tablet (4 mg total) by mouth daily before breakfast.   [DISCONTINUED] glucose  blood (ONE TOUCH ULTRA TEST) test strip E11.9   [DISCONTINUED] hydrochlorothiazide (HYDRODIURIL) 25 MG tablet Take 1 tablet (25 mg total) by mouth daily.   [DISCONTINUED] Lancets (ONETOUCH ULTRASOFT) lancets Use as instructed   [DISCONTINUED] levocetirizine (XYZAL) 5 MG tablet TAKE 1 TABLET BY MOUTH EVERY DAY IN THE EVENING   [DISCONTINUED] losartan (COZAAR) 50 MG tablet TAKE 1 TABLET BY MOUTH EVERY DAY   [DISCONTINUED] Magnesium 400 MG TABS Take 250 mg by mouth daily. (Patient not taking: Reported on 07/30/2020)   [DISCONTINUED] mometasone (NASONEX) 50 MCG/ACT nasal spray Place 2 sprays into the nose daily.   [DISCONTINUED] Multiple Vitamins-Minerals (ZINC PO) Take by mouth.   [DISCONTINUED] omeprazole (PRILOSEC) 40 MG capsule TAKE 1 CAPSULE BY MOUTH EVERY DAY   [DISCONTINUED] simvastatin (ZOCOR) 10 MG tablet TAKE 1 TABLET BY MOUTH EVERYDAY AT BEDTIME   [DISCONTINUED] VITAMIN D, CHOLECALCIFEROL, PO Take 1,000 Units by mouth daily. (Patient not taking: Reported on 07/30/2020)   No facility-administered encounter medications on file as of 05/01/2021.    Allergies (verified) Ace inhibitors, Metformin and related, Penicillins, and Sulfa antibiotics   History: Past Medical History:  Diagnosis Date   Diabetes mellitus without complication (HCC)    GERD (gastroesophageal reflux disease)    Hyperlipidemia    Hypertension    Seasonal allergies    Past Surgical History:  Procedure Laterality Date   CATARACT EXTRACTION Right 08/2014   CATARACT EXTRACTION Left 04/2016   HAMMER TOE SURGERY     PARTIAL HYSTERECTOMY     fibroids   Family History  Problem Relation Age of Onset   Diabetes Mother    Cancer Brother    Diabetes Sister    Cancer Brother    Heart disease Brother    Sudden death Brother    Heart disease Brother    Diabetes Brother    Diabetes Brother    Social History   Socioeconomic History   Marital status: Divorced    Spouse name: Not on file   Number of children: 0    Years of education: Not on file   Highest education level: Some college, no degree  Occupational History   Occupation: retired  Tobacco Use   Smoking status: Never   Smokeless tobacco: Never  Vaping Use   Vaping Use: Never used  Substance and Sexual Activity   Alcohol use: No    Alcohol/week: 0.0 standard drinks   Drug use: No   Sexual activity: Not Currently  Other Topics Concern   Not on file  Social History Narrative   Pt lives alone.    Social Determinants of Health   Financial Resource Strain: Medium Risk   Difficulty of Paying Living Expenses: Somewhat hard  Food Insecurity: No Food Insecurity   Worried About Charity fundraiser in the Last Year: Never true   Ran Out of Food in the Last Year: Never true  Transportation Needs: No Transportation Needs   Lack of Transportation (Medical): No   Lack of Transportation (Non-Medical): No  Physical Activity: Sufficiently Active   Days of Exercise per Week: 3 days   Minutes of Exercise per Session: 60 min  Stress: No Stress  Concern Present   Feeling of Stress : Not at all  Social Connections: Socially Isolated   Frequency of Communication with Friends and Family: More than three times a week   Frequency of Social Gatherings with Friends and Family: More than three times a week   Attends Religious Services: Never   Database administrator or Organizations: No   Attends Engineer, structural: Never   Marital Status: Divorced    Tobacco Counseling Counseling given: Not Answered   Clinical Intake:  Pre-visit preparation completed: Yes  Pain : No/denies pain     Nutritional Risks: None Diabetes: Yes CBG done?: No Did pt. bring in CBG monitor from home?: No  How often do you need to have someone help you when you read instructions, pamphlets, or other written materials from your doctor or pharmacy?: 1 - Never  Nutrition Risk Assessment:  Has the patient had any N/V/D within the last 2 months?  No  Does  the patient have any non-healing wounds?  No  Has the patient had any unintentional weight loss or weight gain?  No   Diabetes:  Is the patient diabetic?  Yes  If diabetic, was a CBG obtained today?  No  Did the patient bring in their glucometer from home?  No  How often do you monitor your CBG's? daily.   Financial Strains and Diabetes Management:  Are you having any financial strains with the device, your supplies or your medication? No .  Does the patient want to be seen by Chronic Care Management for management of their diabetes?  No  Would the patient like to be referred to a Nutritionist or for Diabetic Management?  No   Diabetic Exams:  Diabetic Eye Exam: Completed 01/15/21 negative retinopathy.   Diabetic Foot Exam: Completed 01/28/21.   Interpreter Needed?: No  Information entered by :: Reather Littler LPN   Activities of Daily Living In your present state of health, do you have any difficulty performing the following activities: 05/01/2021 04/24/2021  Hearing? Malvin Johns  Vision? N N  Difficulty concentrating or making decisions? N N  Walking or climbing stairs? N N  Dressing or bathing? N N  Doing errands, shopping? N N  Preparing Food and eating ? N -  Using the Toilet? N -  In the past six months, have you accidently leaked urine? N -  Do you have problems with loss of bowel control? N -  Managing your Medications? N -  Managing your Finances? N -  Housekeeping or managing your Housekeeping? N -  Some recent data might be hidden    Patient Care Team: Reubin Milan, MD as PCP - General (Internal Medicine) Charlsie Merles (Optometry) [place], [place], DPM as Consulting Physician (Podiatry)  Indicate any recent Medical Services you may have received from other than [facility] providers in the past year (date may be approximate).     Assessment:   This is a routine wellness examination for Br[place].  Hearing/Vision screen Hearing Screening - Comments:: Pt c/o difficulty  hearing in right ear but left ear is fine  Vision Screening - Comments:: Annual vision screenings with Dr. Ezzard Standing  Dietary issues and exercise activities discussed: Current Exercise Habits: Home exercise routine, Type of exercise: walking, Time (Minutes): 60, Frequency (Times/Week): 3, Weekly Exercise (Minutes/Week): 180, Intensity: Mild, Exercise limited by: None identified   Goals Addressed   None    Depression Screen [facility] 2/9 Scores 05/01/2021 04/24/2021 01/28/2021 08/20/2020 08/03/2020 07/30/2020 05/28/2020  PHQ - 2 Score 0 0 0 0 0 0 0  PHQ- 9 Score - 0 5 0 0 2 -    Fall Risk Fall Risk  05/01/2021 04/24/2021 01/28/2021 08/20/2020 08/03/2020  Falls in the past year? 0 0 0 0 1  Number falls in past yr: 0 0 0 - 0  Injury with Fall? 0 0 0 - 0  Risk for fall due to : No Fall Risks No Fall Risks No Fall Risks - -  Follow up Falls prevention discussed Falls evaluation completed Falls evaluation completed Falls evaluation completed Falls evaluation completed    FALL RISK PREVENTION PERTAINING TO THE HOME:  Any stairs in or around the home? Yes  If so, are there any without handrails? No  Home free of loose throw rugs in walkways, pet beds, electrical cords, etc? Yes  Adequate lighting in your home to reduce risk of falls? Yes   ASSISTIVE DEVICES UTILIZED TO PREVENT FALLS:  Life alert? No  Use of a cane, walker or w/c? No  Grab bars in the bathroom? Yes  Shower chair or bench in shower? No  Elevated toilet seat or a handicapped toilet? No   TIMED UP AND GO:  Was the test performed? No . Telephonic  visit.   Cognitive Function: Normal cognitive status assessed by direct observation by this Nurse Health Advisor. No abnormalities found.       6CIT Screen 04/30/2020 04/25/2019 03/23/2017  What Year? 0 points 0 points 0 points  What month? 0 points 0 points 0 points  What time? 0 points 0 points 0 points  Count back from 20 0 points 0 points 0 points  Months in reverse 0 points 0 points 0  points  Repeat phrase 0 points 0 points 2 points  Total Score 0 0 2    Immunizations Immunization History  Administered Date(s) Administered   Fluad Quad(high Dose 65+) 02/18/2019, 03/13/2020, 01/28/2021   Influenza, High Dose Seasonal PF 03/23/2017, 03/22/2018   Influenza,inj,Quad PF,6+ Mos 03/19/2015, 03/20/2016   Moderna Covid-19 Vaccine Bivalent Booster 67yrs & up 03/01/2021   Moderna Sars-Covid-2 Vaccination 06/24/2019, 07/26/2019, 03/19/2020   Pneumococcal Conjugate-13 03/20/2016   Pneumococcal Polysaccharide-23 03/23/2017   Zoster, Live 11/05/2012    TDAP status: Due, Education has been provided regarding the importance of this vaccine. Advised may receive this vaccine at local pharmacy or Health Dept. Aware to provide a copy of the vaccination record if obtained from local pharmacy or Health Dept. Verbalized acceptance and understanding.  Flu Vaccine status: Up to date  Pneumococcal vaccine status: Up to date  Covid-19 vaccine status: Completed vaccines  Qualifies for Shingles Vaccine? Yes   Zostavax completed Yes   Shingrix Completed?: No.    Education has been provided regarding the importance of this vaccine. Patient has been advised to call insurance company to determine out of pocket expense if they have not yet received this vaccine. Advised may also receive vaccine at local pharmacy or Health Dept. Verbalized acceptance and understanding.  Screening Tests Health Maintenance  Topic Date Due   TETANUS/TDAP  Never done   Zoster Vaccines- Shingrix (1 of 2) Never done   MAMMOGRAM  05/09/2021   HEMOGLOBIN A1C  10/23/2021   OPHTHALMOLOGY EXAM  01/15/2022   FOOT EXAM  01/28/2022   COLONOSCOPY (Pts 45-90yrs Insurance coverage will need to be confirmed)  05/20/2022   Pneumonia Vaccine 18+ Years old  Completed   INFLUENZA VACCINE  Completed   DEXA SCAN  Completed  COVID-19 Vaccine  Completed   Hepatitis C Screening  Addressed   HPV VACCINES  Aged Out    Health  Maintenance  Health Maintenance Due  Topic Date Due   TETANUS/TDAP  Never done   Zoster Vaccines- Shingrix (1 of 2) Never done    Colorectal cancer screening: Type of screening: Colonoscopy. Completed 2014. Repeat every 10 years  Mammogram status: Completed 05/09/20. Repeat every year  Bone Density status: Completed 08/18/17. Results reflect: Bone density results: OSTEOPENIA. Repeat every 2 years.  Lung Cancer Screening: (Low Dose CT Chest recommended if Age 89-80 years, 30 pack-year currently smoking OR have quit w/in 15years.) does not qualify.   Additional Screening:  Hepatitis C Screening: does qualify; Completed 12/01/12  Vision Screening: Recommended annual ophthalmology exams for early detection of glaucoma and other disorders of the eye. Is the patient up to date with their annual eye exam?  Yes  Who is the provider or what is the name of the office in which the patient attends annual eye exams? Dr. Lucia Gaskins.   Dental Screening: Recommended annual dental exams for proper oral hygiene  Community Resource Referral / Chronic Care Management: CRR required this visit?  No   CCM required this visit?  No      Plan:     I have personally reviewed and noted the following in the patients chart:   Medical and social history Use of alcohol, tobacco or illicit drugs  Current medications and supplements including opioid prescriptions.  Functional ability and status Nutritional status Physical activity Advanced directives List of other physicians Hospitalizations, surgeries, and ER visits in previous 12 months Vitals Screenings to include cognitive, depression, and falls Referrals and appointments  In addition, I have reviewed and discussed with patient certain preventive protocols, quality metrics, and best practice recommendations. A written personalized care plan for preventive services as well as general preventive health recommendations were provided to patient.      Clemetine Marker, LPN   [id number]   Nurse Notes: none

## 2021-05-01 NOTE — Patient Instructions (Signed)
[name] , Thank you for taking time to come for your Medicare Wellness Visit. I appreciate your ongoing commitment to your health goals. Please review the following plan we discussed and let me know if I can assist you in the future.   Screening recommendations/referrals: Colonoscopy: done 2014; repeat in 2024 Mammogram: done 05/09/20. Please call [facility] to schedule your mammogram and bone density screening.  Bone Density: done 08/18/17 Recommended yearly ophthalmology/optometry visit for glaucoma screening and checkup Recommended yearly dental visit for hygiene and checkup  Vaccinations: Influenza vaccine: done 01/28/21 Pneumococcal vaccine: done 03/23/17 Tdap vaccine: due Shingles vaccine: Shingrix discussed. Please contact your pharmacy for coverage information.  Covid-19:done 06/24/19, 07/26/19, 03/19/20 & 03/01/21  Advanced directives: Advance directive discussed with you today. I have provided a copy for you to complete at home and have notarized. Once this is complete please bring a copy in to our office so we can scan it into your chart.   Conditions/risks identified: Keep up the great work!  Next appointment: Follow up in one year for your annual wellness visit    Preventive Care 65 Years and Older, Female Preventive care refers to lifestyle choices and visits with your health care provider that can promote health and wellness. What does preventive care include? A yearly physical exam. This is also called an annual well check. Dental exams once or twice a year. Routine eye exams. Ask your health care provider how often you should have your eyes checked. Personal lifestyle choices, including: Daily care of your teeth and gums. Regular physical activity. Eating a healthy diet. Avoiding tobacco and drug use. Limiting alcohol use. Practicing safe sex. Taking low-dose aspirin every day. Taking vitamin and mineral supplements as recommended by your health care  provider. What happens during an annual well check? The services and screenings done by your health care provider during your annual well check will depend on your age, overall health, lifestyle risk factors, and family history of disease. Counseling  Your health care provider may ask you questions about your: Alcohol use. Tobacco use. Drug use. Emotional well-being. Home and relationship well-being. Sexual activity. Eating habits. History of falls. Memory and ability to understand (cognition). Work and work Statistician. Reproductive health. Screening  You may have the following tests or measurements: Height, weight, and BMI. Blood pressure. Lipid and cholesterol levels. These may be checked every 5 years, or more frequently if you are over 66 years old. Skin check. Lung cancer screening. You may have this screening every year starting at age 63 if you have a 30-pack-year history of smoking and currently smoke or have quit within the past 15 years. Fecal occult blood test (FOBT) of the stool. You may have this test every year starting at age 59. Flexible sigmoidoscopy or colonoscopy. You may have a sigmoidoscopy every 5 years or a colonoscopy every 10 years starting at age 66. Hepatitis C blood test. Hepatitis B blood test. Sexually transmitted disease (STD) testing. Diabetes screening. This is done by checking your blood sugar (glucose) after you have not eaten for a while (fasting). You may have this done every 1-3 years. Bone density scan. This is done to screen for osteoporosis. You may have this done starting at age 54. Mammogram. This may be done every 1-2 years. Talk to your health care provider about how often you should have regular mammograms. Talk with your health care provider about your test results, treatment options, and if necessary, the need for more tests. Vaccines  Your health care provider may recommend certain vaccines, such as: Influenza vaccine. This is  recommended every year. Tetanus, diphtheria, and acellular pertussis (Tdap, Td) vaccine. You may need a Td booster every 10 years. Zoster vaccine. You may need this after age 22. Pneumococcal 13-valent conjugate (PCV13) vaccine. One dose is recommended after age 93. Pneumococcal polysaccharide (PPSV23) vaccine. One dose is recommended after age 41. Talk to your health care provider about which screenings and vaccines you need and how often you need them. This information is not intended to replace advice given to you by your health care provider. Make sure you discuss any questions you have with your health care provider. Document Released: 06/01/2015 Document Revised: 01/23/2016 Document Reviewed: 03/06/2015 Elsevier Interactive Patient Education  2017 [place] Prevention in the Home Falls can cause injuries. They can happen to people of all ages. There are many things you can do to make your home safe and to help prevent falls. What can I do on the outside of my home? Regularly fix the edges of walkways and driveways and fix any cracks. Remove anything that might make you trip as you walk through a door, such as a raised step or threshold. Trim any bushes or trees on the path to your home. Use bright outdoor lighting. Clear any walking paths of anything that might make someone trip, such as rocks or tools. Regularly check to see if handrails are loose or broken. Make sure that both sides of any steps have handrails. Any raised decks and porches should have guardrails on the edges. Have any leaves, snow, or ice cleared regularly. Use sand or salt on walking paths during winter. Clean up any spills in your garage right away. This includes oil or grease spills. What can I do in the bathroom? Use night lights. Install grab bars by the toilet and in the tub and shower. Do not use towel bars as grab bars. Use non-skid mats or decals in the tub or shower. If you need to sit down in  the shower, use a plastic, non-slip stool. Keep the floor dry. Clean up any water that spills on the floor as soon as it happens. Remove soap buildup in the tub or shower regularly. Attach bath mats securely with double-sided non-slip rug tape. Do not have throw rugs and other things on the floor that can make you trip. What can I do in the bedroom? Use night lights. Make sure that you have a light by your bed that is easy to reach. Do not use any sheets or blankets that are too big for your bed. They should not hang down onto the floor. Have a firm chair that has side arms. You can use this for support while you get dressed. Do not have throw rugs and other things on the floor that can make you trip. What can I do in the kitchen? Clean up any spills right away. Avoid walking on wet floors. Keep items that you use a lot in easy-to-reach places. If you need to reach something above you, use a strong step stool that has a grab bar. Keep electrical cords out of the way. Do not use floor polish or wax that makes floors slippery. If you must use wax, use non-skid floor wax. Do not have throw rugs and other things on the floor that can make you trip. What can I do with my stairs? Do not leave any items on the stairs. Make sure that there are  handrails on both sides of the stairs and use them. Fix handrails that are broken or loose. Make sure that handrails are as long as the stairways. Check any carpeting to make sure that it is firmly attached to the stairs. Fix any carpet that is loose or worn. Avoid having throw rugs at the top or bottom of the stairs. If you do have throw rugs, attach them to the floor with carpet tape. Make sure that you have a light switch at the top of the stairs and the bottom of the stairs. If you do not have them, ask someone to add them for you. What else can I do to help prevent falls? Wear shoes that: Do not have high heels. Have rubber bottoms. Are comfortable  and fit you well. Are closed at the toe. Do not wear sandals. If you use a stepladder: Make sure that it is fully opened. Do not climb a closed stepladder. Make sure that both sides of the stepladder are locked into place. Ask someone to hold it for you, if possible. Clearly mark and make sure that you can see: Any grab bars or handrails. First and last steps. Where the edge of each step is. Use tools that help you move around (mobility aids) if they are needed. These include: Canes. Walkers. Scooters. Crutches. Turn on the lights when you go into a dark area. Replace any light bulbs as soon as they burn out. Set up your furniture so you have a clear path. Avoid moving your furniture around. If any of your floors are uneven, fix them. If there are any pets around you, be aware of where they are. Review your medicines with your doctor. Some medicines can make you feel dizzy. This can increase your chance of falling. Ask your doctor what other things that you can do to help prevent falls. This information is not intended to replace advice given to you by your health care provider. Make sure you discuss any questions you have with your health care provider. Document Released: 03/01/2009 Document Revised: 10/11/2015 Document Reviewed: 06/09/2014 Elsevier Interactive Patient Education  2017 [employer].

## 2021-05-07 ENCOUNTER — Telehealth: Payer: Self-pay | Admitting: Internal Medicine

## 2021-05-07 DIAGNOSIS — E2839 Other primary ovarian failure: Secondary | ICD-10-CM | POA: Diagnosis not present

## 2021-05-07 DIAGNOSIS — Z1231 Encounter for screening mammogram for malignant neoplasm of breast: Secondary | ICD-10-CM | POA: Diagnosis not present

## 2021-05-07 NOTE — Telephone Encounter (Signed)
Bone density is normal.  Repeat in 3 years.

## 2021-05-08 NOTE — Telephone Encounter (Signed)
Called pt could not leave VM. VM not set up.  [facility] nurse may give results to patient if they return call to clinic, a CRM has been created.  KP

## 2021-05-08 NOTE — Telephone Encounter (Signed)
Pt given message  per notes of Dr. [name]  on 05/08/21. Pt verbalized understanding bone density is normal and repeat in 3 years.

## 2021-06-08 ENCOUNTER — Other Ambulatory Visit: Payer: Self-pay | Admitting: Internal Medicine

## 2021-06-08 DIAGNOSIS — I10 Essential (primary) hypertension: Secondary | ICD-10-CM

## 2021-06-08 NOTE — Telephone Encounter (Signed)
Requested Prescriptions  Pending Prescriptions Disp Refills   losartan (COZAAR) 50 MG tablet [Pharmacy Med Name: LOSARTAN POTASSIUM 50 MG TAB] 90 tablet 0    Sig: TAKE 1 TABLET BY MOUTH EVERY DAY     Cardiovascular:  Angiotensin Receptor Blockers Failed - 06/08/2021  9:43 AM      Failed - K in normal range and within 180 days    Potassium  Date Value Ref Range Status  04/24/2021 3.4 (L) 3.5 - 5.2 mmol/L Final         Passed - Cr in normal range and within 180 days    Creatinine, Ser  Date Value Ref Range Status  04/24/2021 0.73 0.57 - 1.00 mg/dL Final         Passed - Patient is not pregnant      Passed - Last BP in normal range    BP Readings from Last 1 Encounters:  04/24/21 132/60         Passed - Valid encounter within last 6 months    Recent Outpatient Visits          1 month ago Annual physical exam   [facility] [name], MD   4 months ago Essential (primary) hypertension   [facility] [name], MD   9 months ago DM (diabetes mellitus), type 2 with complications [facility])   [facility] [name], MD   10 months ago Cellulitis of finger of left hand   [facility] [name], MD   10 months ago Acute non-recurrent maxillary sinusitis   [facility] [name], MD      Future Appointments            In 2 months [name] Jesse Sans, MD [facility], [place]   In 10 months [name], MD [facility], [facility]            hydrochlorothiazide (HYDRODIURIL) 25 MG tablet [Pharmacy Med Name: HYDROCHLOROTHIAZIDE 25 MG TAB] 90 tablet 0    Sig: TAKE 1 TABLET (25 MG TOTAL) BY MOUTH DAILY.     Cardiovascular: Diuretics - Thiazide Failed - 06/08/2021  9:43 AM      Failed - K in normal range and within 360 days    Potassium  Date Value Ref Range Status  04/24/2021 3.4 (L) 3.5 - 5.2 mmol/L Final         Passed - Ca in normal range and within 360 days    Calcium  Date  Value Ref Range Status  04/24/2021 9.6 8.7 - 10.3 mg/dL Final         Passed - Cr in normal range and within 360 days    Creatinine, Ser  Date Value Ref Range Status  04/24/2021 0.73 0.57 - 1.00 mg/dL Final         Passed - Na in normal range and within 360 days    Sodium  Date Value Ref Range Status  04/24/2021 144 134 - 144 mmol/L Final         Passed - Last BP in normal range    BP Readings from Last 1 Encounters:  04/24/21 132/60         Passed - Valid encounter within last 6 months    Recent Outpatient Visits          1 month ago Annual physical exam   [facility] [name], MD   4 months ago  Essential (primary) hypertension   [facility] [name], MD   9 months ago DM (diabetes mellitus), type 2 with complications [facility])   [facility] [name], MD   10 months ago Cellulitis of finger of left hand   [place], [place]   10 months ago Acute non-recurrent maxillary sinusitis   [facility] [name], MD      Future Appointments            In 2 months [name] Jesse Sans, MD [facility], [employer]   In 10 months [name] Jesse Sans, MD [facility], [facility]

## 2021-06-12 ENCOUNTER — Ambulatory Visit: Payer: Medicare HMO | Admitting: Internal Medicine

## 2021-06-12 DIAGNOSIS — H9312 Tinnitus, left ear: Secondary | ICD-10-CM | POA: Diagnosis not present

## 2021-06-13 ENCOUNTER — Telehealth: Payer: Self-pay | Admitting: Internal Medicine

## 2021-06-13 ENCOUNTER — Other Ambulatory Visit: Payer: Self-pay

## 2021-06-13 ENCOUNTER — Ambulatory Visit (INDEPENDENT_AMBULATORY_CARE_PROVIDER_SITE_OTHER): Payer: Medicare HMO | Admitting: Internal Medicine

## 2021-06-13 ENCOUNTER — Encounter: Payer: Self-pay | Admitting: Internal Medicine

## 2021-06-13 VITALS — BP 114/62 | HR 78 | Ht 62.0 in | Wt 159.0 lb

## 2021-06-13 DIAGNOSIS — E118 Type 2 diabetes mellitus with unspecified complications: Secondary | ICD-10-CM | POA: Diagnosis not present

## 2021-06-13 DIAGNOSIS — H9312 Tinnitus, left ear: Secondary | ICD-10-CM | POA: Diagnosis not present

## 2021-06-13 DIAGNOSIS — H6690 Otitis media, unspecified, unspecified ear: Secondary | ICD-10-CM | POA: Diagnosis not present

## 2021-06-13 DIAGNOSIS — Q828 Other specified congenital malformations of skin: Secondary | ICD-10-CM

## 2021-06-13 MED ORDER — AZITHROMYCIN 250 MG PO TABS: ORAL_TABLET | ORAL | 0 refills | Status: AC

## 2021-06-13 NOTE — Telephone Encounter (Signed)
Pt called to see if refill and yeast infection medication was sent to pharmacy / advised pt that Zpak was sent and Dr. [name] was gone for the day, so we are waiting on Dr. Oneal Deputy advisement about the other medication/ pt stated she will wait to start the zpak until she hears about the other medication / please advise

## 2021-06-13 NOTE — Telephone Encounter (Signed)
Please review.  KP

## 2021-06-13 NOTE — Telephone Encounter (Signed)
Pt is calling to let Chassidy know that she received script for azithromycin (ZITHROMAX Z-PAK) 250 MG tablet [[id number]]   Also requesting script for yeast infection  Preferred pharmacy- [place]  Cb- [phone]

## 2021-06-13 NOTE — Progress Notes (Signed)
Date:  06/13/2021   Name:  Kathy Dougherty   DOB:  [birth year]   MRN:  [id number]   Chief Complaint: Tinnitus  Otalgia  There is pain in the left ear. This is a new problem. The current episode started yesterday. There has been no fever. Associated symptoms include hearing loss. Pertinent negatives include no coughing or sore throat. Associated symptoms comments: tinnitus. She has tried nothing for the symptoms. Improvement on treatment: better today - tinnitus resolved.  Skin changes - thick skin on both feet.  Seen by podiatry in the past and would like to return.  Lab Results  Component Value Date   NA 144 04/24/2021   K 3.4 (L) 04/24/2021   CO2 26 04/24/2021   GLUCOSE 121 (H) 04/24/2021   BUN 15 04/24/2021   CREATININE 0.73 04/24/2021   CALCIUM 9.6 04/24/2021   EGFR 88 04/24/2021   GFRNONAA >60 12/18/2020   Lab Results  Component Value Date   CHOL 125 04/24/2021   HDL 47 04/24/2021   LDLCALC 66 04/24/2021   TRIG 55 04/24/2021   CHOLHDL 2.7 04/24/2021   Lab Results  Component Value Date   TSH 1.530 04/24/2021   Lab Results  Component Value Date   HGBA1C 6.4 (H) 04/24/2021   Lab Results  Component Value Date   WBC 4.1 04/24/2021   HGB 12.8 04/24/2021   HCT 38.8 04/24/2021   MCV 85 04/24/2021   PLT 219 04/24/2021   Lab Results  Component Value Date   ALT 18 04/24/2021   AST 22 04/24/2021   ALKPHOS 64 04/24/2021   BILITOT 0.4 04/24/2021   No results found for: 25OHVITD2, 25OHVITD3, VD25OH   Review of Systems  Constitutional:  Negative for chills, fatigue and fever.  HENT:  Positive for ear pain, hearing loss and tinnitus. Negative for sore throat.   Respiratory:  Negative for cough, chest tightness and wheezing.   Psychiatric/Behavioral:  Negative for dysphoric mood and sleep disturbance. The patient is not nervous/anxious.    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   Lipoma of lateral chest wall  04/18/2020   Porokeratosis 08/05/2019   Hyperlipidemia associated with type 2 diabetes mellitus (HCC) 03/26/2017   Iritis of left eye 03/26/2017   Pre-ulcerative calluses 03/26/2017   Arthritis of right knee 01/15/2016   DM (diabetes mellitus), type 2 with complications (HCC) 03/15/2015   Abnormal LFTs 03/15/2015   Essential (primary) hypertension 03/15/2015   Gastro-esophageal reflux disease without esophagitis 03/15/2015   Allergic rhinitis, seasonal 03/15/2015    Allergies  Allergen Reactions   Ace Inhibitors Cough   Metformin And Related Diarrhea   Penicillins Rash   Sulfa Antibiotics Rash    Past Surgical History:  Procedure Laterality Date   CATARACT EXTRACTION Right 08/2014   CATARACT EXTRACTION Left 04/2016   HAMMER TOE SURGERY     PARTIAL HYSTERECTOMY     fibroids    Social History   Tobacco Use   Smoking status: Never   Smokeless tobacco: Never  Vaping Use   Vaping Use: Never used  Substance Use Topics   Alcohol use: No    Alcohol/week: 0.0 standard drinks   Drug use: No     Medication list has been reviewed and updated.  Current Meds  Medication Sig   acetaminophen (TYLENOL) 500 MG tablet Take 500 mg by mouth every 6 (six) hours as needed.   amLODipine (NORVASC) 2.5 MG tablet TAKE 1  TABLET BY MOUTH EVERY DAY   blood glucose meter kit and supplies KIT Dispense based on patient and insurance preference. Test BS bid   dapagliflozin propanediol (FARXIGA) 5 MG TABS tablet Take 1 tablet (5 mg total) by mouth daily before breakfast.   docusate sodium (COLACE) 100 MG capsule Take 100 mg by mouth 2 (two) times daily as needed.   glimepiride (AMARYL) 4 MG tablet Take 1 tablet (4 mg total) by mouth daily before breakfast.   hydrochlorothiazide (HYDRODIURIL) 25 MG tablet TAKE 1 TABLET (25 MG TOTAL) BY MOUTH DAILY.   ibuprofen (ADVIL,MOTRIN) 600 MG tablet TAKE 1 TABLET (600 MG TOTAL) BY MOUTH EVERY 8 (EIGHT) HOURS AS NEEDED.   losartan (COZAAR) 50 MG tablet TAKE  1 TABLET BY MOUTH EVERY DAY   mometasone (NASONEX) 50 MCG/ACT nasal spray Place 2 sprays into the nose daily.   omeprazole (PRILOSEC) 40 MG capsule TAKE 1 CAPSULE BY MOUTH EVERY DAY   OneTouch Delica Lancets 81X MISC TEST TWICE DAILY   ONETOUCH ULTRA test strip TEST TWICE DAILY   simvastatin (ZOCOR) 10 MG tablet TAKE 1 TABLET BY MOUTH EVERYDAY AT BEDTIME   triamcinolone (KENALOG) 0.1 % Apply topically.    PHQ 2/9 Scores 06/13/2021 05/01/2021 04/24/2021 01/28/2021  PHQ - 2 Score 0 0 0 0  PHQ- 9 Score 0 - 0 5    GAD 7 : Generalized Anxiety Score 04/24/2021 01/28/2021 08/20/2020 08/03/2020  Nervous, Anxious, on Edge 0 0 0 0  Control/stop worrying 0 0 0 0  Worry too much - different things 0 2 0 0  Trouble relaxing 0 0 0 0  Restless 0 0 0 0  Easily annoyed or irritable 0 0 0 0  Afraid - awful might happen 0 0 0 0  Total GAD 7 Score 0 2 0 0  Anxiety Difficulty Not difficult at all - - -    BP Readings from Last 3 Encounters:  06/13/21 114/62  04/24/21 132/60  01/28/21 122/78    Physical Exam HENT:     Right Ear: Decreased hearing noted.     Left Ear: Ear canal normal. No decreased hearing noted. No swelling or tenderness.  No middle ear effusion. Tympanic membrane is retracted. Tympanic membrane is not erythematous.     Nose: Nose normal.  Cardiovascular:     Rate and Rhythm: Normal rate and regular rhythm.  Pulmonary:     Effort: Pulmonary effort is normal.     Breath sounds: Normal breath sounds. No decreased breath sounds or wheezing.  Musculoskeletal:     Cervical back: Normal range of motion.  Lymphadenopathy:     Cervical: No cervical adenopathy.  Skin:    Capillary Refill: Capillary refill takes less than 2 seconds.     Comments: Thick skin on soles of both feet    Wt Readings from Last 3 Encounters:  06/13/21 159 lb (72.1 kg)  04/24/21 160 lb 3.2 oz (72.7 kg)  01/28/21 164 lb (74.4 kg)    BP 114/62    Pulse 78    Ht $R'5\' 2"'WK$  (1.575 m)    Wt 159 lb (72.1 kg)    SpO2  98%    BMI 29.08 kg/m   Assessment and Plan: 1. Acute otitis media, unspecified otitis media type Take Advil or Tylenol if needed for pain. - azithromycin (ZITHROMAX Z-PAK) 250 MG tablet; UAD  Dispense: 6 each; Refill: 0  2. Tinnitus, left Resolved - if recurrent/persistent may need ENT evaluation.  3. Porokeratosis - Ambulatory  referral to Podiatry  4. DM (diabetes mellitus), type 2 with complications ([place]) Follow up as scheduled. - Ambulatory referral to Podiatry   Partially dictated using [employer]. Any errors are unintentional.  [name], MD [facility]  06/13/2021

## 2021-06-14 NOTE — Telephone Encounter (Signed)
Called pt fit her know to call us when she does have symptoms of a yeast infection. Pt not having symptoms at this time. Pt verbalized understanding.  KP

## 2021-06-14 NOTE — Telephone Encounter (Signed)
Pt called back, requesting an update regarding medication.

## 2021-06-28 ENCOUNTER — Encounter (HOSPITAL_BASED_OUTPATIENT_CLINIC_OR_DEPARTMENT_OTHER): Payer: Self-pay

## 2021-06-28 ENCOUNTER — Emergency Department (HOSPITAL_BASED_OUTPATIENT_CLINIC_OR_DEPARTMENT_OTHER): Payer: Medicare HMO

## 2021-06-28 ENCOUNTER — Other Ambulatory Visit: Payer: Self-pay

## 2021-06-28 ENCOUNTER — Emergency Department (HOSPITAL_BASED_OUTPATIENT_CLINIC_OR_DEPARTMENT_OTHER)
Admission: EM | Admit: 2021-06-28 | Discharge: 2021-06-28 | Disposition: A | Discharge status: Home / Self Care | Payer: Medicare HMO | Attending: Emergency Medicine | Admitting: Emergency Medicine

## 2021-06-28 DIAGNOSIS — E119 Type 2 diabetes mellitus without complications: Secondary | ICD-10-CM | POA: Insufficient documentation

## 2021-06-28 DIAGNOSIS — Z7984 Long term (current) use of oral hypoglycemic drugs: Secondary | ICD-10-CM | POA: Insufficient documentation

## 2021-06-28 DIAGNOSIS — I10 Essential (primary) hypertension: Secondary | ICD-10-CM | POA: Insufficient documentation

## 2021-06-28 DIAGNOSIS — M62838 Other muscle spasm: Secondary | ICD-10-CM | POA: Diagnosis not present

## 2021-06-28 DIAGNOSIS — I6523 Occlusion and stenosis of bilateral carotid arteries: Secondary | ICD-10-CM | POA: Diagnosis not present

## 2021-06-28 DIAGNOSIS — R519 Headache, unspecified: Secondary | ICD-10-CM | POA: Diagnosis not present

## 2021-06-28 DIAGNOSIS — Z79899 Other long term (current) drug therapy: Secondary | ICD-10-CM | POA: Diagnosis not present

## 2021-06-28 DIAGNOSIS — M542 Cervicalgia: Secondary | ICD-10-CM | POA: Insufficient documentation

## 2021-06-28 LAB — BASIC METABOLIC PANEL
Anion gap: 7 (ref 5–15)
BUN: 12 mg/dL (ref 8–23)
CO2: 32 mmol/L (ref 22–32)
Calcium: 10.4 mg/dL — ABNORMAL HIGH (ref 8.9–10.3)
Chloride: 102 mmol/L (ref 98–111)
Creatinine, Ser: 0.65 mg/dL (ref 0.44–1.00)
GFR, Estimated: >60 mL/min (ref >60)
Glucose, Bld: 133 mg/dL — ABNORMAL HIGH (ref 70–99)
Potassium: 3.3 mmol/L — ABNORMAL LOW (ref 3.5–5.1)
Sodium: 141 mmol/L (ref 135–145)

## 2021-06-28 LAB — CBC WITH DIFFERENTIAL/PLATELET
Abs Immature Granulocytes: 0.02 10*3/uL (ref 0.00–0.07)
Basophils Absolute: 0 10*3/uL (ref 0.0–0.1)
Basophils Relative: 1 %
Eosinophils Absolute: 0.1 10*3/uL (ref 0.0–0.5)
Eosinophils Relative: 1 %
HCT: 40.6 % (ref 36.0–46.0)
Hemoglobin: 13.1 g/dL (ref 12.0–15.0)
Immature Granulocytes: 0 %
Lymphocytes Relative: 20 %
Lymphs Abs: 1.3 10*3/uL (ref 0.7–4.0)
MCH: 28 pg (ref 26.0–34.0)
MCHC: 32.3 g/dL (ref 30.0–36.0)
MCV: 86.8 fL (ref 80.0–100.0)
Monocytes Absolute: 0.6 10*3/uL (ref 0.1–1.0)
Monocytes Relative: 10 %
Neutro Abs: 4.3 10*3/uL (ref 1.7–7.7)
Neutrophils Relative %: 68 %
Platelets: 204 10*3/uL (ref 150–400)
RBC: 4.68 MIL/uL (ref 3.87–5.11)
RDW: 13.2 % (ref 11.5–15.5)
WBC: 6.3 10*3/uL (ref 4.0–10.5)
nRBC: 0 % (ref 0.0–0.2)

## 2021-06-28 MED ORDER — PROCHLORPERAZINE EDISYLATE 10 MG/2ML IJ SOLN
10.0000 mg | Freq: Once | INTRAMUSCULAR | Status: AC
  Administered 2021-06-28: 10 mg via INTRAVENOUS
  Filled 2021-06-28: qty 2

## 2021-06-28 MED ORDER — DIPHENHYDRAMINE HCL 50 MG/ML IJ SOLN
50.0000 mg | Freq: Once | INTRAMUSCULAR | Status: AC
  Administered 2021-06-28: 50 mg via INTRAVENOUS
  Filled 2021-06-28: qty 1

## 2021-06-28 MED ORDER — CYCLOBENZAPRINE HCL 10 MG PO TABS: 10.0000 mg | ORAL_TABLET | Freq: Two times a day (BID) | ORAL | 0 refills | Status: DC | PRN

## 2021-06-28 MED ORDER — CYCLOBENZAPRINE HCL 10 MG PO TABS
10.0000 mg | ORAL_TABLET | Freq: Once | ORAL | Status: AC
  Administered 2021-06-28: 10 mg via ORAL
  Filled 2021-06-28: qty 1

## 2021-06-28 MED ORDER — LIDOCAINE 5 % EX PTCH: 1.0000 | MEDICATED_PATCH | CUTANEOUS | 0 refills | Status: DC

## 2021-06-28 MED ORDER — IOHEXOL 350 MG/ML SOLN
100.0000 mL | Freq: Once | INTRAVENOUS | Status: AC | PRN
  Administered 2021-06-28: 100 mL via INTRAVENOUS

## 2021-06-28 MED ORDER — SODIUM CHLORIDE 0.9 % IV BOLUS
1000.0000 mL | Freq: Once | INTRAVENOUS | Status: AC
  Administered 2021-06-28: 1000 mL via INTRAVENOUS

## 2021-06-28 NOTE — ED Notes (Signed)
Dr Tegeler in room w/pt now. 

## 2021-06-28 NOTE — ED Provider Notes (Signed)
[facility] EMERGENCY DEPT Provider Note   CSN: [id number] Arrival date & time: 06/28/21  0102     History  No chief complaint on file.   Kathy Dougherty is a 71 y.o. female.  The history is provided by medical records and the patient. No language interpreter was used.  Headache Pain location:  Occipital Quality:  Sharp and dull Severity at highest:  7/10 Onset quality:  Gradual Duration:  3 days Timing:  Constant Progression:  Waxing and waning Chronicity:  New Context: not activity and not exposure to bright light   Relieved by:  Nothing Worsened by:  Neck movement Ineffective treatments:  None tried Associated symptoms: neck pain   Associated symptoms: no abdominal pain, no back pain, no blurred vision, no congestion, no cough, no diarrhea, no dizziness, no drainage, no facial pain, no fatigue, no fever, no focal weakness, no loss of balance, no myalgias, no nausea, no near-syncope, no numbness, no paresthesias, no photophobia, no seizures, no syncope, no visual change, no vomiting and no weakness   Neck Injury This is a new problem. The current episode started more than 2 days ago. The problem occurs constantly. The problem has not changed since onset.Associated symptoms include headaches. Pertinent negatives include no chest pain, no abdominal pain and no shortness of breath. Nothing aggravates the symptoms. Nothing relieves the symptoms. She has tried nothing for the symptoms.      Home Medications Prior to Admission medications   Medication Sig Start Date End Date Taking? Authorizing Provider  acetaminophen (TYLENOL) 500 MG tablet Take 500 mg by mouth every 6 (six) hours as needed.    [provider]  amLODipine (NORVASC) 2.5 MG tablet TAKE 1 TABLET BY MOUTH EVERY DAY 02/25/21   [name], MD  blood glucose meter kit and supplies KIT Dispense based on patient and insurance preference. Test BS bid 04/18/20   [name], MD   dapagliflozin propanediol (FARXIGA) 5 MG TABS tablet Take 1 tablet (5 mg total) by mouth daily before breakfast. 02/18/21   [name], MD  docusate sodium (COLACE) 100 MG capsule Take 100 mg by mouth 2 (two) times daily as needed.    [provider]  glimepiride (AMARYL) 4 MG tablet Take 1 tablet (4 mg total) by mouth daily before breakfast. 04/24/21   [name], MD  hydrochlorothiazide (HYDRODIURIL) 25 MG tablet TAKE 1 TABLET (25 MG TOTAL) BY MOUTH DAILY. 06/08/21   [name], MD  ibuprofen (ADVIL,MOTRIN) 600 MG tablet TAKE 1 TABLET (600 MG TOTAL) BY MOUTH EVERY 8 (EIGHT) HOURS AS NEEDED. 08/16/18   [name], MD  losartan (COZAAR) 50 MG tablet TAKE 1 TABLET BY MOUTH EVERY DAY 06/08/21   [name], MD  mometasone (NASONEX) 50 MCG/ACT nasal spray Place 2 sprays into the nose daily. 07/30/20   [name], MD  omeprazole (PRILOSEC) 40 MG capsule TAKE 1 CAPSULE BY MOUTH EVERY DAY 07/22/20   [name], MD  OneTouch Delica Lancets 72Z MISC TEST TWICE DAILY 08/30/20   [name], MD  [facility] ULTRA test strip TEST TWICE DAILY 08/30/20   [name], MD  simvastatin (ZOCOR) 10 MG tablet TAKE 1 TABLET BY MOUTH EVERYDAY AT BEDTIME 04/24/21   [name], MD  triamcinolone (KENALOG) 0.1 % Apply topically. 08/02/20 08/02/21  [provider]      Allergies    Ace inhibitors, Metformin and related, Penicillins, and Sulfa antibiotics  Review of Systems   Review of Systems  Constitutional:  Negative for chills, fatigue and fever.  HENT:  Negative for congestion and postnasal drip.   Eyes:  Negative for blurred vision and photophobia.  Respiratory:  Negative for cough, chest tightness, shortness of breath and wheezing.   Cardiovascular:  Negative for chest pain, syncope and near-syncope.  Gastrointestinal:  Negative for abdominal pain, constipation, diarrhea, nausea and vomiting.  Genitourinary:  Negative for flank pain.   Musculoskeletal:  Positive for neck pain. Negative for back pain and myalgias.  Skin:  Negative for rash and wound.  Neurological:  Positive for headaches. Negative for dizziness, focal weakness, seizures, syncope, weakness, light-headedness, numbness, paresthesias and loss of balance.  Psychiatric/Behavioral:  Negative for agitation.    Physical Exam Updated Vital Signs BP (!) 152/79 (BP Location: Right Arm)    Pulse 67    Temp 97.9 F (36.6 C) (Oral)    Resp 18    Ht $R'5\' 2"'RM$  (1.575 m)    Wt 68 kg    SpO2 99%    BMI 27.44 kg/m  Physical Exam Vitals and nursing note reviewed.  Constitutional:      General: She is not in acute distress.    Appearance: She is well-developed. She is not ill-appearing, toxic-appearing or diaphoretic.  HENT:     Head: Normocephalic and atraumatic.     Nose: No congestion or rhinorrhea.     Mouth/Throat:     Mouth: Mucous membranes are moist.     Pharynx: No oropharyngeal exudate or posterior oropharyngeal erythema.  Eyes:     Extraocular Movements: Extraocular movements intact.     Conjunctiva/sclera: Conjunctivae normal.     Pupils: Pupils are equal, round, and reactive to light.  Neck:      Comments: Tenderness to right lateral and posterior neck.  Some spasm appreciated.  No midline tenderness.  Pain with movement.  Was able to range of motion with discomfort.  No focal neurologic deficits.  No numbness or weakness in arms. Cardiovascular:     Rate and Rhythm: Normal rate and regular rhythm.     Heart sounds: No murmur heard. Pulmonary:     Effort: Pulmonary effort is normal. No respiratory distress.     Breath sounds: Normal breath sounds. No wheezing, rhonchi or rales.  Chest:     Chest wall: No tenderness.  Abdominal:     General: Abdomen is flat.     Palpations: Abdomen is soft.     Tenderness: There is no abdominal tenderness.  Musculoskeletal:        General: Tenderness present. No swelling.     Cervical back: Neck supple. Tenderness  present. No erythema, signs of trauma, rigidity, torticollis or crepitus. Pain with movement and muscular tenderness present. No spinous process tenderness. Normal range of motion.     Right lower leg: No edema.     Left lower leg: No edema.  Skin:    General: Skin is warm and dry.     Capillary Refill: Capillary refill takes less than 2 seconds.     Findings: No erythema.  Neurological:     General: No focal deficit present.     Mental Status: She is alert.     Cranial Nerves: No cranial nerve deficit.     Sensory: No sensory deficit.     Motor: No weakness.  Psychiatric:        Mood and Affect: Mood normal.    ED Results / Procedures / Treatments  Labs (all labs ordered are listed, but only abnormal results are displayed) Labs Reviewed  BASIC METABOLIC PANEL - Abnormal; Notable for the following components:      Result Value   Potassium 3.3 (*)    Glucose, Bld 133 (*)    Calcium 10.4 (*)    All other components within normal limits  CBC WITH DIFFERENTIAL/PLATELET    EKG None  Radiology CT ANGIO HEAD NECK W WO CM  Result Date: 06/28/2021 CLINICAL DATA:  Provided history: Headache, new or worsening; severe pain in right neck into head since yesterday. Rule out dissection. EXAM: CT ANGIOGRAPHY HEAD AND NECK TECHNIQUE: Multidetector CT imaging of the head and neck was performed using the standard protocol during bolus administration of intravenous contrast. Multiplanar CT image reconstructions and MIPs were obtained to evaluate the vascular anatomy. Carotid stenosis measurements (when applicable) are obtained utilizing NASCET criteria, using the distal internal carotid diameter as the denominator. RADIATION DOSE REDUCTION: This exam was performed according to the departmental dose-optimization program which includes automated exposure control, adjustment of the mA and/or kV according to patient size and/or use of iterative reconstruction technique. CONTRAST:  166mL OMNIPAQUE  IOHEXOL 350 MG/ML SOLN COMPARISON:  No pertinent prior exams available for comparison. FINDINGS: CT HEAD FINDINGS Brain: Mild generalized cerebral atrophy. There is no acute intracranial hemorrhage. No demarcated cortical infarct. No extra-axial fluid collection. No evidence of an intracranial mass. No midline shift. Vascular: No hyperdense vessel.  Atherosclerotic calcifications. Skull: Normal. Negative for fracture or focal lesion. Sinuses: Nonspecific opacification of a posterior left ethmoid air cell. Mild mucosal thickening, and small mucous retention cysts, within the right maxillary sinus. Orbits: No orbital mass or acute orbital finding. Review of the MIP images confirms the above findings CTA NECK FINDINGS Aortic arch: Standard aortic branching. Atherosclerotic plaque within the visualized aortic arch. No hemodynamically significant innominate or proximal subclavian artery stenosis. Right carotid system: CCA and ICA patent within the neck without stenosis or significant atherosclerotic disease. Left carotid system: CCA and ICA patent within the neck without stenosis or significant atherosclerotic disease. Vertebral arteries: The vertebral arteries are patent within the neck bilaterally without significant stenosis. Mild nonstenotic calcified plaque within the left V1 segment. Skeleton: Reversal of the expected cervical lordosis. Cervical spondylosis. Most notably, central disc protrusions contribute to apparent at least moderate spinal canal stenosis at C3-C4 and C4-C5. Partially imaged thoracic dextrocurvature. Other neck: No neck mass or cervical lymphadenopathy. Somewhat heterogeneous appearance of the bilateral parotid glands, nonspecific. Upper chest: No consolidation within the imaged lung apices. Review of the MIP images confirms the above findings CTA HEAD FINDINGS Anterior circulation: The intracranial internal carotid arteries are patent. Calcified plaque within both vessels with no more than mild  stenosis. The M1 middle cerebral arteries are patent. No M2 proximal branch occlusion or high-grade proximal stenosis is identified. The anterior cerebral arteries are patent. No intracranial aneurysm is identified. Posterior circulation: The intracranial vertebral arteries are patent. Nonstenotic calcified plaque within the left vertebral artery at the level of the skull base. The basilar artery is patent. The posterior cerebral arteries are patent. Posterior communicating arteries are diminutive or absent bilaterally. Venous sinuses: Within the limitations of contrast timing, no convincing thrombus. Anatomic variants: As described. Review of the MIP images confirms the above findings IMPRESSION: CT head: 1. No evidence of acute intracranial abnormality. 2. Mild generalized cerebral atrophy. 3. Mild paranasal sinus disease, as described. CTA neck: 1. The common carotid, internal carotid and vertebral arteries are patent within  the neck without hemodynamically significant stenosis. No evidence of dissection. 2. Aortic Atherosclerosis (ICD10-I70.0). 3. Cervical spondylosis. Notably, central disc protrusions contribute to apparent at least moderate spinal canal stenosis at C3-C4 and C4-C5. CTA head: 1. No intracranial large vessel occlusion or proximal high-grade arterial stenosis. 2. Calcified plaque within the bilateral intracranial ICAs, with no more than mild stenosis. 3. Non-stenotic calcified plaque within the left vertebral artery at the level of the skull base. Electronically Signed   By: Kellie Simmering D.O.   On: 06/28/2021 12:46    Procedures Procedures    Medications Ordered in ED Medications  prochlorperazine (COMPAZINE) injection 10 mg (10 mg Intravenous Given 06/28/21 1032)  diphenhydrAMINE (BENADRYL) injection 50 mg (50 mg Intravenous Given 06/28/21 1032)  sodium chloride 0.9 % bolus 1,000 mL (0 mLs Intravenous Stopped 06/28/21 1403)  iohexol (OMNIPAQUE) 350 MG/ML injection 100 mL (100 mLs  Intravenous Contrast Given 06/28/21 1159)  cyclobenzaprine (FLEXERIL) tablet 10 mg (10 mg Oral Given 06/28/21 1454)    ED Course/ Medical Decision Making/ A&P                           Medical Decision Making Amount and/or Complexity of Data Reviewed Labs: ordered. Radiology: ordered.  Risk Prescription drug management.    Kathy Dougherty is a 71 y.o. female with a past medical history significant for hypertension, diabetes, GERD, and hyperlipidemia who presents with severe pain in her right neck going into her head.  According to patient, several days ago she was lifting something heavy and then had a crick in her neck.  She reports that the pain goes from her neck into her head and has been severe.  She denies nausea, vomiting, numbness, tingling, weakness of extremities.  Denies speech difficulties.  Reports the headache is severe.  She has not had headache like this in a long time.  She denies new cough, chest pain, shortness of breath, constipation, diarrhea, or urinary symptoms.  No history of neck dissection, vascular troubles, or aneurysms.  Patient denies fevers, chills, and denies history of infection such as meningitis.  On exam, patient's right lateral neck is tender to palpation with some spasm.  Tenderness goes into the head.  No focal neurologic deficits appreciated.  No midline neck tenderness.  Lungs clear and chest nontender.  Abdomen nontender.  Patient has normal sensation and strength in extremities.  Good pulses.  Pupils symmetric and reactive normal extract movements.  Symmetric smile.  Clear speech.  Patient otherwise well-appearing.  Clinically I suspect patient pulled a muscle in her right neck several days ago and is having severe pain since however with the severe sharp pain starting suddenly going from her neck into her head with the persistent symptoms, I do agree it is reasonable to do CT a of the head and neck to look for any vascular dissection or aneurysm trouble.   We will also give a headache cocktail.  If work-up is reassuring, anticipate discharge with muscle relaxant and possible Lidoderm patches for likely musculoskeletal spasm and pain.  CT imaging reassuring.  Went over all the findings with patient.  She requested a dose of the Flexeril which was given.  She will use the prescription for Lidoderm patches and will follow-up with PCP.  She had no other questions or concerns discharged in good condition.         Final Clinical Impression(s) / ED Diagnoses Final diagnoses:  Neck muscle spasm  Neck pain  on right side  Acute nonintractable headache, unspecified headache type    Rx / DC Orders ED Discharge Orders          Ordered    lidocaine (LIDODERM) 5 %  Every 24 hours        06/28/21 1449    cyclobenzaprine (FLEXERIL) 10 MG tablet  2 times daily PRN        06/28/21 1449            Clinical Impression: 1. Neck muscle spasm   2. Neck pain on right side   3. Acute nonintractable headache, unspecified headache type     Disposition: Discharge  Condition: Good  I have discussed the results, Dx and Tx plan with the pt(& family if present). He/she/they expressed understanding and agree(s) with the plan. Discharge instructions discussed at great length. Strict return precautions discussed and pt &/or family have verbalized understanding of the instructions. No further questions at time of discharge.    Discharge Medication List as of 06/28/2021  2:50 PM     START taking these medications   Details  cyclobenzaprine (FLEXERIL) 10 MG tablet Take 1 tablet (10 mg total) by mouth 2 (two) times daily as needed for muscle spasms., Starting Fri 06/28/2021, Print    lidocaine (LIDODERM) 5 % Place 1 patch onto the skin daily. Remove & Discard patch within 12 hours or as directed by MD, Starting Fri 06/28/2021, Print        Follow Up: [name], MD [address] [place] [address] [facility] [place] [id number] [phone]        Kathy Dougherty, Gwenyth Allegra, MD 06/28/21 [phone]

## 2021-06-28 NOTE — ED Notes (Signed)
Pt states pain in her neck worsens when laying flat.  Pt adjusted in bed to more upright position and pt states "that helped a bit."

## 2021-06-28 NOTE — ED Triage Notes (Signed)
Pt came in POV c/o upper neck/lower head pain that started yesterday and has got worse. Patient has been take ibuprofen with no relief. Pt denies any recent falls/injuries.

## 2021-06-28 NOTE — Discharge Instructions (Signed)
Your history, exam, work-up today are suggestive of a neck muscle spasm causing the neck pain and headaches.  The CT imaging did not show evidence of acute abnormality causing the symptoms specifically no evidence of vertebral artery dissection or intracranial bleeding.  No evidence of acute stroke.  Your exam was reassuring and I suspect your symptoms are related to lifting the object with your right hand recently.  Please rest and stay hydrated and use the medication and patch to help with your symptoms.  Please follow-up with your primary doctor.  If any symptoms change or worsen, please return to the nearest emergency department.

## 2021-07-01 ENCOUNTER — Ambulatory Visit: Payer: Medicare HMO | Admitting: Podiatry

## 2021-07-02 ENCOUNTER — Ambulatory Visit (INDEPENDENT_AMBULATORY_CARE_PROVIDER_SITE_OTHER): Payer: Medicare HMO | Admitting: Internal Medicine

## 2021-07-02 ENCOUNTER — Other Ambulatory Visit: Payer: Self-pay

## 2021-07-02 ENCOUNTER — Encounter: Payer: Self-pay | Admitting: Internal Medicine

## 2021-07-02 VITALS — BP 122/78 | HR 83 | Ht 62.0 in | Wt 157.0 lb

## 2021-07-02 DIAGNOSIS — M62838 Other muscle spasm: Secondary | ICD-10-CM

## 2021-07-02 DIAGNOSIS — R0981 Nasal congestion: Secondary | ICD-10-CM | POA: Diagnosis not present

## 2021-07-02 DIAGNOSIS — I700 Atherosclerosis of aorta: Secondary | ICD-10-CM | POA: Insufficient documentation

## 2021-07-02 MED ORDER — MOMETASONE FUROATE 50 MCG/ACT NA SUSP: 2.0000 | Freq: Every day | NASAL | 1 refills | Status: AC

## 2021-07-02 MED ORDER — BACLOFEN 20 MG PO TABS: 20.0000 mg | ORAL_TABLET | Freq: Three times a day (TID) | ORAL | 0 refills | Status: DC

## 2021-07-02 NOTE — Progress Notes (Signed)
Date:  07/02/2021   Name:  Kathy Dougherty   DOB:  [birth year]   MRN:  [id number]   Chief Complaint: Hospitalization Follow-up (Neck pain, feel better than when she went in, cant move head to the right /)  Neck Pain  This is a new problem. The current episode started in the past 7 days. The problem occurs constantly. The problem has been gradually improving. The pain is associated with nothing (seen in ER - CT negative for dissection). Pertinent negatives include no chest pain, headaches or weakness. She has tried muscle relaxants (and lidoderm patches) for the symptoms.  The flexeril was $14 for 10 tablets and the lidoderm was $70 so she did not get it. She is using heat which helps.  She is improving but still having trouble with sleep.  Lab Results  Component Value Date   NA 141 06/28/2021   K 3.3 (L) 06/28/2021   CO2 32 06/28/2021   GLUCOSE 133 (H) 06/28/2021   BUN 12 06/28/2021   CREATININE 0.65 06/28/2021   CALCIUM 10.4 (H) 06/28/2021   EGFR 88 04/24/2021   GFRNONAA >60 06/28/2021   Lab Results  Component Value Date   CHOL 125 04/24/2021   HDL 47 04/24/2021   LDLCALC 66 04/24/2021   TRIG 55 04/24/2021   CHOLHDL 2.7 04/24/2021   Lab Results  Component Value Date   TSH 1.530 04/24/2021   Lab Results  Component Value Date   HGBA1C 6.4 (H) 04/24/2021   Lab Results  Component Value Date   WBC 6.3 06/28/2021   HGB 13.1 06/28/2021   HCT 40.6 06/28/2021   MCV 86.8 06/28/2021   PLT 204 06/28/2021   Lab Results  Component Value Date   ALT 18 04/24/2021   AST 22 04/24/2021   ALKPHOS 64 04/24/2021   BILITOT 0.4 04/24/2021   No results found for: 25OHVITD2, 25OHVITD3, VD25OH   Review of Systems  Constitutional:  Negative for fatigue and unexpected weight change.  HENT:  Positive for congestion. Negative for nosebleeds.   Eyes:  Negative for visual disturbance.  Respiratory:  Negative for cough, chest tightness, shortness of breath and wheezing.    Cardiovascular:  Negative for chest pain, palpitations and leg swelling.  Gastrointestinal:  Negative for abdominal pain, constipation and diarrhea.  Musculoskeletal:  Positive for neck pain and neck stiffness.  Neurological:  Negative for dizziness, weakness, light-headedness and headaches.  Psychiatric/Behavioral:  Positive for sleep disturbance. Negative for dysphoric mood. The patient is not nervous/anxious.    Patient Active Problem List   Diagnosis Date Noted   Aortic arch atherosclerosis ([place]) 07/02/2021   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   Lipoma of lateral chest wall 04/18/2020   Porokeratosis 08/05/2019   Hyperlipidemia associated with type 2 diabetes mellitus ([place]) 03/26/2017   Iritis of left eye 03/26/2017   Pre-ulcerative calluses 03/26/2017   Arthritis of right knee 01/15/2016   DM (diabetes mellitus), type 2 with complications ([place]) 35/00/9381   Abnormal LFTs 03/15/2015   Essential (primary) hypertension 03/15/2015   Gastro-esophageal reflux disease without esophagitis 03/15/2015   Allergic rhinitis, seasonal 03/15/2015    Allergies  Allergen Reactions   Ace Inhibitors Cough   Metformin And Related Diarrhea   Penicillins Rash   Sulfa Antibiotics Rash    Past Surgical History:  Procedure Laterality Date   CATARACT EXTRACTION Right 08/2014   CATARACT EXTRACTION Left 04/2016   HAMMER TOE SURGERY     PARTIAL HYSTERECTOMY     fibroids  Social History   Tobacco Use   Smoking status: Never   Smokeless tobacco: Never  Vaping Use   Vaping Use: Never used  Substance Use Topics   Alcohol use: No    Alcohol/week: 0.0 standard drinks   Drug use: No     Medication list has been reviewed and updated.  Current Meds  Medication Sig   acetaminophen (TYLENOL) 500 MG tablet Take 500 mg by mouth every 6 (six) hours as needed.   amLODipine (NORVASC) 2.5 MG tablet TAKE 1 TABLET BY MOUTH EVERY DAY   baclofen (LIORESAL) 20 MG tablet Take 1 tablet (20 mg total) by  mouth 3 (three) times daily.   blood glucose meter kit and supplies KIT Dispense based on patient and insurance preference. Test BS bid   cyclobenzaprine (FLEXERIL) 10 MG tablet Take 1 tablet (10 mg total) by mouth 2 (two) times daily as needed for muscle spasms.   dapagliflozin propanediol (FARXIGA) 5 MG TABS tablet Take 1 tablet (5 mg total) by mouth daily before breakfast.   docusate sodium (COLACE) 100 MG capsule Take 100 mg by mouth 2 (two) times daily as needed.   glimepiride (AMARYL) 4 MG tablet Take 1 tablet (4 mg total) by mouth daily before breakfast.   hydrochlorothiazide (HYDRODIURIL) 25 MG tablet TAKE 1 TABLET (25 MG TOTAL) BY MOUTH DAILY.   ibuprofen (ADVIL,MOTRIN) 600 MG tablet TAKE 1 TABLET (600 MG TOTAL) BY MOUTH EVERY 8 (EIGHT) HOURS AS NEEDED.   losartan (COZAAR) 50 MG tablet TAKE 1 TABLET BY MOUTH EVERY DAY   omeprazole (PRILOSEC) 40 MG capsule TAKE 1 CAPSULE BY MOUTH EVERY DAY   OneTouch Delica Lancets 49F MISC TEST TWICE DAILY   ONETOUCH ULTRA test strip TEST TWICE DAILY   simvastatin (ZOCOR) 10 MG tablet TAKE 1 TABLET BY MOUTH EVERYDAY AT BEDTIME   triamcinolone (KENALOG) 0.1 % Apply topically.   [DISCONTINUED] mometasone (NASONEX) 50 MCG/ACT nasal spray Place 2 sprays into the nose daily.    PHQ 2/9 Scores 07/02/2021 06/13/2021 05/01/2021 04/24/2021  PHQ - 2 Score 0 0 0 0  PHQ- 9 Score 0 0 - 0    GAD 7 : Generalized Anxiety Score 07/02/2021 06/13/2021 04/24/2021 01/28/2021  Nervous, Anxious, on Edge 0 0 0 0  Control/stop worrying 0 0 0 0  Worry too much - different things 0 0 0 2  Trouble relaxing 0 0 0 0  Restless 0 0 0 0  Easily annoyed or irritable 0 0 0 0  Afraid - awful might happen 0 0 0 0  Total GAD 7 Score 0 0 0 2  Anxiety Difficulty - - Not difficult at all -    BP Readings from Last 3 Encounters:  07/02/21 122/78  06/28/21 (!) 150/96  06/13/21 114/62    Physical Exam Vitals and nursing note reviewed.  Constitutional:      General: She is not in  acute distress.    Appearance: She is well-developed.  HENT:     Head: Normocephalic and atraumatic.     Nose:     Right Sinus: No maxillary sinus tenderness or frontal sinus tenderness.     Left Sinus: No maxillary sinus tenderness or frontal sinus tenderness.  Cardiovascular:     Rate and Rhythm: Normal rate and regular rhythm.     Pulses: Normal pulses.     Heart sounds: No murmur heard. Pulmonary:     Effort: Pulmonary effort is normal. No respiratory distress.     Breath sounds: No  wheezing or rhonchi.  Musculoskeletal:     Cervical back: Spasms and tenderness present. No bony tenderness. Pain with movement present. Decreased range of motion.  Skin:    General: Skin is warm and dry.     Findings: No rash.  Neurological:     Mental Status: She is alert and oriented to person, place, and time.  Psychiatric:        Mood and Affect: Mood normal.        Behavior: Behavior normal.    Wt Readings from Last 3 Encounters:  07/02/21 157 lb (71.2 kg)  06/28/21 150 lb (68 kg)  06/13/21 159 lb (72.1 kg)    BP 122/78    Pulse 83    Ht _0  (1.575 m)    Wt 157 lb (71.2 kg)    SpO2 96%    BMI 28.72 kg/m   Assessment and Plan: 1. Neck muscle spasm Finish flexeril and begin tid Baclofen. Use heat around the entire neck and shoulder area Continue Advil tid. Discontinue lidoderm patches - baclofen (LIORESAL) 20 MG tablet; Take 1 tablet (20 mg total) by mouth 3 (three) times daily.  Dispense: 60 each; Refill: 0  2. Nasal sinus congestion Mild sinus disease on CT. Recommend resuming Nasonex during pollen season. - mometasone (NASONEX) 50 MCG/ACT nasal spray; Place 2 sprays into the nose daily.  Dispense: 17 g; Refill: 1   Partially dictated using Editor, commissioning. Any errors are unintentional.  [name], MD [facility]  07/02/2021

## 2021-07-15 ENCOUNTER — Other Ambulatory Visit: Payer: Self-pay

## 2021-07-15 ENCOUNTER — Ambulatory Visit: Payer: Medicare HMO | Admitting: Podiatry

## 2021-07-15 ENCOUNTER — Encounter: Payer: Self-pay | Admitting: Podiatry

## 2021-07-15 DIAGNOSIS — B351 Tinea unguium: Secondary | ICD-10-CM

## 2021-07-15 DIAGNOSIS — M79676 Pain in unspecified toe(s): Secondary | ICD-10-CM

## 2021-07-15 DIAGNOSIS — E1142 Type 2 diabetes mellitus with diabetic polyneuropathy: Secondary | ICD-10-CM | POA: Diagnosis not present

## 2021-07-15 DIAGNOSIS — D2372 Other benign neoplasm of skin of left lower limb, including hip: Secondary | ICD-10-CM

## 2021-07-15 DIAGNOSIS — D2371 Other benign neoplasm of skin of right lower limb, including hip: Secondary | ICD-10-CM

## 2021-07-15 NOTE — Progress Notes (Signed)
She presents today after having not seen her for quite some time chief complaint of painful calluses bilaterally.  Objective: Pulses are strong and palpable.  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  Assessment: Painful benign skin lesions.  Plan: Debrided painful benign skin lesions.

## 2021-08-03 ENCOUNTER — Other Ambulatory Visit: Payer: Self-pay | Admitting: Internal Medicine

## 2021-08-03 DIAGNOSIS — I10 Essential (primary) hypertension: Secondary | ICD-10-CM

## 2021-08-05 NOTE — Telephone Encounter (Signed)
Requested Prescriptions  ?Pending Prescriptions Disp Refills  ?? amLODipine (NORVASC) 2.5 MG tablet [Pharmacy Med Name: AMLODIPINE BESYLATE 2.5 MG TAB] 90 tablet 0  ?  Sig: TAKE 1 TABLET BY MOUTH EVERY DAY  ?  ? Cardiovascular: Calcium Channel Blockers 2 Passed - 08/03/2021 11:40 AM  ?  ?  Passed - Last BP in normal range  ?  BP Readings from Last 1 Encounters:  ?07/02/21 122/78  ?   ?  ?  Passed - Last Heart Rate in normal range  ?  Pulse Readings from Last 1 Encounters:  ?07/02/21 83  ?   ?  ?  Passed - Valid encounter within last 6 months  ?  Recent Outpatient Visits   ?      ? 1 month ago Neck muscle spasm  ? [facility] [name], MD  ? 1 month ago Acute otitis media, unspecified otitis media type  ? [facility] [name], MD  ? 3 months ago Annual physical exam  ? [facility] [name], MD  ? 6 months ago Essential (primary) hypertension  ? [facility] [name], MD  ? 11 months ago DM (diabetes mellitus), type 2 with complications [facility])  ? [facility] [name], MD  ?  ?  ?Future Appointments   ?        ? In 3 weeks [name] Jesse Sans, MD [facility], [place]  ? In 8 months [name] Jesse Sans, MD [facility], [place]  ?  ? ?  ?  ?  ? ? ?

## 2021-08-17 DIAGNOSIS — J301 Allergic rhinitis due to pollen: Secondary | ICD-10-CM | POA: Diagnosis not present

## 2021-08-17 DIAGNOSIS — R0981 Nasal congestion: Secondary | ICD-10-CM | POA: Diagnosis not present

## 2021-08-17 DIAGNOSIS — Z03818 Encounter for observation for suspected exposure to other biological agents ruled out: Secondary | ICD-10-CM | POA: Diagnosis not present

## 2021-08-17 DIAGNOSIS — R058 Other specified cough: Secondary | ICD-10-CM | POA: Diagnosis not present

## 2021-08-26 ENCOUNTER — Encounter: Payer: Self-pay | Admitting: Internal Medicine

## 2021-08-26 ENCOUNTER — Ambulatory Visit (INDEPENDENT_AMBULATORY_CARE_PROVIDER_SITE_OTHER): Payer: Medicare HMO | Admitting: Internal Medicine

## 2021-08-26 VITALS — BP 128/62 | HR 73 | Ht 62.0 in | Wt 158.0 lb

## 2021-08-26 DIAGNOSIS — E118 Type 2 diabetes mellitus with unspecified complications: Secondary | ICD-10-CM | POA: Diagnosis not present

## 2021-08-26 DIAGNOSIS — I700 Atherosclerosis of aorta: Secondary | ICD-10-CM | POA: Diagnosis not present

## 2021-08-26 DIAGNOSIS — I10 Essential (primary) hypertension: Secondary | ICD-10-CM

## 2021-08-26 MED ORDER — LOSARTAN POTASSIUM 50 MG PO TABS: 50.0000 mg | ORAL_TABLET | Freq: Every day | ORAL | 1 refills | Status: DC

## 2021-08-26 MED ORDER — HYDROCHLOROTHIAZIDE 25 MG PO TABS: 25.0000 mg | ORAL_TABLET | Freq: Every day | ORAL | 1 refills | Status: DC

## 2021-08-26 NOTE — Progress Notes (Signed)
? ? ?Date:  08/26/2021  ? ?Name:  Kathy Dougherty   DOB:  08-13-1950   MRN:  [id number] ? ? ?Chief Complaint: No chief complaint on file. ? ?Diabetes ?She presents for her follow-up diabetic visit. She has type 2 diabetes mellitus. Her disease course has been stable. Pertinent negatives for hypoglycemia include no headaches or tremors. Pertinent negatives for diabetes include no chest pain, no fatigue, no polydipsia and no polyuria. Current diabetic treatment includes oral agent (dual therapy) (farxiga and glipizide). Her breakfast blood glucose is taken between 6-7 am. Her breakfast blood glucose range is generally 130-140 mg/dl. Her bedtime blood glucose is taken between 9-10 pm. Her bedtime blood glucose range is generally 110-130 mg/dl. An ACE inhibitor/angiotensin II receptor blocker is being taken.  ?Hypertension ?This is a chronic problem. The problem is controlled (at home 125/78). Pertinent negatives include no chest pain, headaches, palpitations or shortness of breath. Past treatments include angiotensin blockers, calcium channel blockers and diuretics. The current treatment provides significant improvement.  ? ?Lab Results  ?Component Value Date  ? NA 141 06/28/2021  ? K 3.3 (L) 06/28/2021  ? CO2 32 06/28/2021  ? GLUCOSE 133 (H) 06/28/2021  ? BUN 12 06/28/2021  ? CREATININE 0.65 06/28/2021  ? CALCIUM 10.4 (H) 06/28/2021  ? EGFR 88 04/24/2021  ? GFRNONAA >60 06/28/2021  ? ?Lab Results  ?Component Value Date  ? CHOL 125 04/24/2021  ? HDL 47 04/24/2021  ? Kim 66 04/24/2021  ? TRIG 55 04/24/2021  ? CHOLHDL 2.7 04/24/2021  ? ?Lab Results  ?Component Value Date  ? TSH 1.530 04/24/2021  ? ?Lab Results  ?Component Value Date  ? HGBA1C 6.4 (H) 04/24/2021  ? ?Lab Results  ?Component Value Date  ? WBC 6.3 06/28/2021  ? HGB 13.1 06/28/2021  ? HCT 40.6 06/28/2021  ? MCV 86.8 06/28/2021  ? PLT 204 06/28/2021  ? ?Lab Results  ?Component Value Date  ? ALT 18 04/24/2021  ? AST 22 04/24/2021  ? ALKPHOS 64 04/24/2021  ?  BILITOT 0.4 04/24/2021  ? ?No results found for: 25OHVITD2, [place], VD25OH  ? ?Review of Systems  ?Constitutional:  Negative for appetite change, fatigue, fever and unexpected weight change.  ?HENT:  Negative for tinnitus and trouble swallowing.   ?Eyes:  Negative for visual disturbance.  ?Respiratory:  Negative for cough, chest tightness and shortness of breath.   ?Cardiovascular:  Negative for chest pain, palpitations and leg swelling.  ?Gastrointestinal:  Negative for abdominal pain.  ?Endocrine: Negative for polydipsia and polyuria.  ?Genitourinary:  Negative for dysuria and hematuria.  ?Musculoskeletal:  Negative for arthralgias.  ?Neurological:  Negative for tremors, numbness and headaches.  ?Psychiatric/Behavioral:  Negative for dysphoric mood.   ? ?Patient Active Problem List  ? Diagnosis Date Noted  ? 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  ? Lipoma of lateral chest wall 04/18/2020  ? Porokeratosis 08/05/2019  ? Hyperlipidemia associated with type 2 diabetes mellitus ([place]) 03/26/2017  ? Iritis of left eye 03/26/2017  ? Pre-ulcerative calluses 03/26/2017  ? Arthritis of right knee 01/15/2016  ? DM (diabetes mellitus), type 2 with complications ([place]) 94/49/6759  ? Abnormal LFTs 03/15/2015  ? Essential (primary) hypertension 03/15/2015  ? Gastro-esophageal reflux disease without esophagitis 03/15/2015  ? Allergic rhinitis, seasonal 03/15/2015  ? ? ?Allergies  ?Allergen Reactions  ? Ace Inhibitors Cough  ? Metformin And Related Diarrhea  ? Penicillins Rash  ? Sulfa Antibiotics Rash  ? ? ?Past Surgical History:  ?  Procedure Laterality Date  ? CATARACT EXTRACTION Right 08/2014  ? CATARACT EXTRACTION Left 04/2016  ? HAMMER TOE SURGERY    ? PARTIAL HYSTERECTOMY    ? fibroids  ? ? ?Social History  ? ?Tobacco Use  ? Smoking status: Never  ? Smokeless tobacco: Never  ?Vaping Use  ? Vaping Use: Never used  ?Substance Use Topics  ? Alcohol use: No  ?  Alcohol/week: 0.0  standard drinks  ? Drug use: No  ? ? ? ?Medication list has been reviewed and updated. ? ?Current Meds  ?Medication Sig  ? acetaminophen (TYLENOL) 500 MG tablet Take 500 mg by mouth every 6 (six) hours as needed.  ? amLODipine (NORVASC) 2.5 MG tablet TAKE 1 TABLET BY MOUTH EVERY DAY  ? baclofen (LIORESAL) 20 MG tablet Take 1 tablet (20 mg total) by mouth 3 (three) times daily.  ? blood glucose meter kit and supplies KIT Dispense based on patient and insurance preference. Test BS bid  ? dapagliflozin propanediol (FARXIGA) 5 MG TABS tablet Take 1 tablet (5 mg total) by mouth daily before breakfast.  ? docusate sodium (COLACE) 100 MG capsule Take 100 mg by mouth 2 (two) times daily as needed.  ? glimepiride (AMARYL) 4 MG tablet Take 1 tablet (4 mg total) by mouth daily before breakfast.  ? ibuprofen (ADVIL,MOTRIN) 600 MG tablet TAKE 1 TABLET (600 MG TOTAL) BY MOUTH EVERY 8 (EIGHT) HOURS AS NEEDED.  ? mometasone (NASONEX) 50 MCG/ACT nasal spray Place 2 sprays into the nose daily.  ? omeprazole (PRILOSEC) 40 MG capsule TAKE 1 CAPSULE BY MOUTH EVERY DAY  ? OneTouch Delica Lancets 25K MISC TEST TWICE DAILY  ? ONETOUCH ULTRA test strip TEST TWICE DAILY  ? simvastatin (ZOCOR) 10 MG tablet TAKE 1 TABLET BY MOUTH EVERYDAY AT BEDTIME  ? [DISCONTINUED] hydrochlorothiazide (HYDRODIURIL) 25 MG tablet TAKE 1 TABLET (25 MG TOTAL) BY MOUTH DAILY.  ? [DISCONTINUED] losartan (COZAAR) 50 MG tablet TAKE 1 TABLET BY MOUTH EVERY DAY  ? ? ? ?  08/26/2021  ?  8:30 AM 07/02/2021  ?  1:55 PM 06/13/2021  ? 10:33 AM 04/24/2021  ?  8:52 AM  ?GAD 7 : Generalized Anxiety Score  ?Nervous, Anxious, on Edge 0 0 0 0  ?Control/stop worrying 0 0 0 0  ?Worry too much - different things 0 0 0 0  ?Trouble relaxing 0 0 0 0  ?Restless 0 0 0 0  ?Easily annoyed or irritable 0 0 0 0  ?Afraid - awful might happen 0 0 0 0  ?Total GAD 7 Score 0 0 0 0  ?Anxiety Difficulty Not difficult at all   Not difficult at all  ? ? ? ?  08/26/2021  ?  8:30 AM  ?Depression screen PHQ  2/9  ?Decreased Interest 0  ?Down, Depressed, Hopeless 0  ?PHQ - 2 Score 0  ?Altered sleeping 0  ?Tired, decreased energy 0  ?Change in appetite 0  ?Feeling bad or failure about yourself  0  ?Trouble concentrating 0  ?Moving slowly or fidgety/restless 0  ?Suicidal thoughts 0  ?PHQ-9 Score 0  ?Difficult doing work/chores Not difficult at all  ? ? ?BP Readings from Last 3 Encounters:  ?08/26/21 128/62  ?07/02/21 122/78  ?06/28/21 (!) 150/96  ? ? ?Physical Exam ?Vitals and nursing note reviewed.  ?Constitutional:   ?   General: She is not in acute distress. ?   Appearance: She is well-developed.  ?HENT:  ?   Head: Normocephalic and atraumatic.  ?  Neck:  ?   Vascular: No carotid bruit.  ?Cardiovascular:  ?   Rate and Rhythm: Normal rate and regular rhythm.  ?   Pulses: Normal pulses.  ?   Heart sounds: No murmur heard. ?Pulmonary:  ?   Effort: Pulmonary effort is normal. No respiratory distress.  ?   Breath sounds: No wheezing or rhonchi.  ?Musculoskeletal:  ?   Cervical back: Normal range of motion.  ?   Right lower leg: No edema.  ?   Left lower leg: No edema.  ?Lymphadenopathy:  ?   Cervical: No cervical adenopathy.  ?Skin: ?   General: Skin is warm and dry.  ?   Capillary Refill: Capillary refill takes less than 2 seconds.  ?   Findings: No rash.  ?Neurological:  ?   General: No focal deficit present.  ?   Mental Status: She is alert and oriented to person, place, and time.  ?Psychiatric:     ?   Mood and Affect: Mood normal.     ?   Behavior: Behavior normal.  ? ? ?Wt Readings from Last 3 Encounters:  ?08/26/21 158 lb (71.7 kg)  ?07/02/21 157 lb (71.2 kg)  ?06/28/21 150 lb (68 kg)  ? ? ?BP 128/62   Pulse 73   Ht _0  (1.575 m)   Wt 158 lb (71.7 kg)   SpO2 97%   BMI 28.90 kg/m?  ? ?Assessment and Plan: ?1. DM (diabetes mellitus), type 2 with complications ([place]) ?Clinically stable by exam and report without s/s of hypoglycemia. ?DM complicated by hypertension and dyslipidemia. ?Tolerating medications well  without side effects or other concerns. ?- Hemoglobin A1c ?- Microalbumin / creatinine urine ratio ? ?2. Essential (primary) hypertension ?Clinically stable exam with well controlled BP. ?Tolerating medications without

## 2021-08-28 LAB — MICROALBUMIN / CREATININE URINE RATIO
Creatinine, Urine: 32.2 mg/dL
Microalb/Creat Ratio: <9 mg/g creat (ref 0–29)
Microalbumin, Urine: <3 ug/mL

## 2021-08-28 LAB — HEMOGLOBIN A1C
Est. average glucose Bld gHb Est-mCnc: 134 mg/dL
Hgb A1c MFr Bld: 6.3 % — ABNORMAL HIGH (ref 4.8–5.6)

## 2021-09-04 ENCOUNTER — Other Ambulatory Visit: Payer: Self-pay | Admitting: Internal Medicine

## 2021-09-04 DIAGNOSIS — R0981 Nasal congestion: Secondary | ICD-10-CM

## 2021-09-04 NOTE — Telephone Encounter (Signed)
Requested medication (s) are due for refill today:   Provider to review ? ?Requested medication (s) are on the active medication list:   Yes ? ?Future visit scheduled:   Yes ? ? ?Last ordered: 07/02/2021 17 g, 1 refill ? ?Returned because it's a non delegated refill  ? ?Requested Prescriptions  ?Pending Prescriptions Disp Refills  ? mometasone (NASONEX) 50 MCG/ACT nasal spray [Pharmacy Med Name: MOMETASONE FUROATE 50 MCG SPRY] 17 each 5  ?  Sig: Place 2 sprays into the nose daily.  ?  ? Not Delegated - Ear, Nose, and Throat: Nasal Preparations - Corticosteroids Failed - 09/04/2021  2:28 AM  ?  ?  Failed - This refill cannot be delegated  ?  ?  Passed - Valid encounter within last 12 months  ?  Recent Outpatient Visits   ? ?      ? 1 week ago DM (diabetes mellitus), type 2 with complications ([place])  ? [facility] [name], MD  ? 2 months ago Neck muscle spasm  ? [facility] [name], MD  ? 2 months ago Acute otitis media, unspecified otitis media type  ? [facility] [name], MD  ? 4 months ago Annual physical exam  ? [facility] [name], MD  ? 7 months ago Essential (primary) hypertension  ? [facility] [name], MD  ? ?  ?  ?Future Appointments   ? ?        ? In 4 months [name] Jesse Sans, MD [facility], [place]  ? In 7 months [name] Jesse Sans, MD [facility], [place]  ? ?  ? ? ?  ?  ?  ? ?

## 2021-09-06 ENCOUNTER — Other Ambulatory Visit: Payer: Self-pay | Admitting: Internal Medicine

## 2021-09-06 ENCOUNTER — Encounter: Payer: Self-pay | Admitting: Internal Medicine

## 2021-09-06 ENCOUNTER — Ambulatory Visit (INDEPENDENT_AMBULATORY_CARE_PROVIDER_SITE_OTHER): Payer: Medicare HMO | Admitting: Internal Medicine

## 2021-09-06 VITALS — BP 102/50 | HR 82 | Ht 62.0 in | Wt 158.0 lb

## 2021-09-06 DIAGNOSIS — J301 Allergic rhinitis due to pollen: Secondary | ICD-10-CM | POA: Diagnosis not present

## 2021-09-06 DIAGNOSIS — R234 Changes in skin texture: Secondary | ICD-10-CM | POA: Diagnosis not present

## 2021-09-06 MED ORDER — LEVOCETIRIZINE DIHYDROCHLORIDE 5 MG PO TABS: 5.0000 mg | ORAL_TABLET | Freq: Every evening | ORAL | 3 refills | Status: DC

## 2021-09-06 NOTE — Progress Notes (Signed)
? ? ?Date:  09/06/2021  ? ?Name:  Kathy Dougherty   DOB:  June 04, 1950   MRN:  [id number] ? ? ?Chief Complaint: Mass (X1-2 weeks, Bump on thumb, left thumb, sore ) ? ?Hand Pain  ?The incident occurred more than 1 week ago. The injury mechanism is unknown. The pain is present in the left fingers. The pain does not radiate.  ?Allergies - much worse this time of year.  Using steroid spray but too drying.  She would like a refill on Xyzal. ? ?Lab Results  ?Component Value Date  ? NA 141 06/28/2021  ? K 3.3 (L) 06/28/2021  ? CO2 32 06/28/2021  ? GLUCOSE 133 (H) 06/28/2021  ? BUN 12 06/28/2021  ? CREATININE 0.65 06/28/2021  ? CALCIUM 10.4 (H) 06/28/2021  ? EGFR 88 04/24/2021  ? GFRNONAA >60 06/28/2021  ? ?Lab Results  ?Component Value Date  ? CHOL 125 04/24/2021  ? HDL 47 04/24/2021  ? [place] 66 04/24/2021  ? TRIG 55 04/24/2021  ? CHOLHDL 2.7 04/24/2021  ? ?Lab Results  ?Component Value Date  ? TSH 1.530 04/24/2021  ? ?Lab Results  ?Component Value Date  ? HGBA1C 6.3 (H) 08/26/2021  ? ?Lab Results  ?Component Value Date  ? WBC 6.3 06/28/2021  ? HGB 13.1 06/28/2021  ? HCT 40.6 06/28/2021  ? MCV 86.8 06/28/2021  ? PLT 204 06/28/2021  ? ?Lab Results  ?Component Value Date  ? ALT 18 04/24/2021  ? AST 22 04/24/2021  ? ALKPHOS 64 04/24/2021  ? BILITOT 0.4 04/24/2021  ? ?No results found for: 25OHVITD2, [place], VD25OH  ? ?Review of Systems  ?Constitutional:  Negative for chills, fatigue and fever.  ?HENT:  Positive for congestion and postnasal drip.   ?Skin:  Positive for wound.  ?     Small lesion on left thumb - started as a bump without injury  ?Allergic/Immunologic: Positive for environmental allergies.  ? ?Patient Active Problem List  ? Diagnosis Date Noted  ? Aortic arch atherosclerosis ([place]) 07/02/2021  ? 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  ? Lipoma of lateral chest wall 04/18/2020  ? Porokeratosis 08/05/2019  ? Hyperlipidemia associated with type 2 diabetes mellitus ([place]) 03/26/2017  ? Iritis of left eye 03/26/2017  ?  Pre-ulcerative calluses 03/26/2017  ? Arthritis of right knee 01/15/2016  ? DM (diabetes mellitus), type 2 with complications ([place]) 24/26/8341  ? Abnormal LFTs 03/15/2015  ? Essential (primary) hypertension 03/15/2015  ? Gastro-esophageal reflux disease without esophagitis 03/15/2015  ? Allergic rhinitis, seasonal 03/15/2015  ? ? ?Allergies  ?Allergen Reactions  ? Ace Inhibitors Cough  ? Metformin And Related Diarrhea  ? Penicillins Rash  ? Sulfa Antibiotics Rash  ? ? ?Past Surgical History:  ?Procedure Laterality Date  ? CATARACT EXTRACTION Right 08/2014  ? CATARACT EXTRACTION Left 04/2016  ? HAMMER TOE SURGERY    ? PARTIAL HYSTERECTOMY    ? fibroids  ? ? ?Social History  ? ?Tobacco Use  ? Smoking status: Never  ? Smokeless tobacco: Never  ?Vaping Use  ? Vaping Use: Never used  ?Substance Use Topics  ? Alcohol use: No  ?  Alcohol/week: 0.0 standard drinks  ? Drug use: No  ? ? ? ?Medication list has been reviewed and updated. ? ?Current Meds  ?Medication Sig  ? acetaminophen (TYLENOL) 500 MG tablet Take 500 mg by mouth every 6 (six) hours as needed.  ? amLODipine (NORVASC) 2.5 MG tablet TAKE 1 TABLET BY MOUTH EVERY DAY  ? Azelastine HCl 137  MCG/SPRAY SOLN Place into both nostrils.  ? blood glucose meter kit and supplies KIT Dispense based on patient and insurance preference. Test BS bid  ? dapagliflozin propanediol (FARXIGA) 5 MG TABS tablet Take 1 tablet (5 mg total) by mouth daily before breakfast.  ? docusate sodium (COLACE) 100 MG capsule Take 100 mg by mouth 2 (two) times daily as needed.  ? glimepiride (AMARYL) 4 MG tablet Take 1 tablet (4 mg total) by mouth daily before breakfast.  ? hydrochlorothiazide (HYDRODIURIL) 25 MG tablet Take 1 tablet (25 mg total) by mouth daily.  ? ibuprofen (ADVIL,MOTRIN) 600 MG tablet TAKE 1 TABLET (600 MG TOTAL) BY MOUTH EVERY 8 (EIGHT) HOURS AS NEEDED.  ? levocetirizine (XYZAL ALLERGY 24HR) 5 MG tablet Take 1 tablet (5 mg total) by mouth every evening.  ? losartan (COZAAR) 50  MG tablet Take 1 tablet (50 mg total) by mouth daily.  ? mometasone (NASONEX) 50 MCG/ACT nasal spray Place 2 sprays into the nose daily.  ? omeprazole (PRILOSEC) 40 MG capsule TAKE 1 CAPSULE BY MOUTH EVERY DAY  ? OneTouch Delica Lancets 16X MISC TEST TWICE DAILY  ? ONETOUCH ULTRA test strip TEST TWICE DAILY  ? simvastatin (ZOCOR) 10 MG tablet TAKE 1 TABLET BY MOUTH EVERYDAY AT BEDTIME  ? [DISCONTINUED] baclofen (LIORESAL) 20 MG tablet Take 1 tablet (20 mg total) by mouth 3 (three) times daily.  ? ? ? ?  09/06/2021  ?  2:40 PM 08/26/2021  ?  8:30 AM 07/02/2021  ?  1:55 PM 06/13/2021  ? 10:33 AM  ?GAD 7 : Generalized Anxiety Score  ?Nervous, Anxious, on Edge 0 0 0 0  ?Control/stop worrying 0 0 0 0  ?Worry too much - different things 0 0 0 0  ?Trouble relaxing 1 0 0 0  ?Restless 0 0 0 0  ?Easily annoyed or irritable 0 0 0 0  ?Afraid - awful might happen 0 0 0 0  ?Total GAD 7 Score 1 0 0 0  ?Anxiety Difficulty  Not difficult at all    ? ? ? ?  09/06/2021  ?  2:40 PM  ?Depression screen PHQ 2/9  ?Decreased Interest 0  ?Down, Depressed, Hopeless 0  ?PHQ - 2 Score 0  ?Altered sleeping 0  ?Tired, decreased energy 0  ?Change in appetite 1  ?Feeling bad or failure about yourself  0  ?Trouble concentrating 0  ?Moving slowly or fidgety/restless 0  ?Suicidal thoughts 0  ?PHQ-9 Score 1  ?Difficult doing work/chores Not difficult at all  ? ? ?BP Readings from Last 3 Encounters:  ?09/06/21 (!) 102/50  ?08/26/21 128/62  ?07/02/21 122/78  ? ? ?Physical Exam ?Vitals and nursing note reviewed.  ?Constitutional:   ?   General: She is not in acute distress. ?   Appearance: Normal appearance. She is well-developed.  ?HENT:  ?   Head: Normocephalic and atraumatic.  ?   Nose:  ?   Right Sinus: No maxillary sinus tenderness or frontal sinus tenderness.  ?   Left Sinus: No maxillary sinus tenderness or frontal sinus tenderness.  ?Pulmonary:  ?   Effort: Pulmonary effort is normal. No respiratory distress.  ?Musculoskeletal:  ?   Right hand: No  swelling, tenderness or bony tenderness.  ?   Left hand: No swelling, tenderness or bony tenderness.  ?Skin: ?   General: Skin is warm and dry.  ?   Findings: Lesion (small eschar on thumb partially raised - normal appearing tissue beneath.) present. No rash.  ?  Neurological:  ?   Mental Status: She is alert and oriented to person, place, and time.  ?Psychiatric:     ?   Mood and Affect: Mood normal.     ?   Behavior: Behavior normal.  ? ? ?Wt Readings from Last 3 Encounters:  ?09/06/21 158 lb (71.7 kg)  ?08/26/21 158 lb (71.7 kg)  ?07/02/21 157 lb (71.2 kg)  ? ? ?BP (!) 102/50   Pulse 82   Ht $R'5\' 2"'xz$  (1.575 m)   Wt 158 lb (71.7 kg)   SpO2 97%   BMI 28.90 kg/m?  ? ?Assessment and Plan: ?1. Eschar of finger ?Appears to be normal without infection. ?Recommend vaseline and bandaid to cover ? ?2. Seasonal allergic rhinitis due to pollen ?Use saline spray for dry nasal passages ?New Rx for Xyzal sent. ?- levocetirizine (XYZAL ALLERGY 24HR) 5 MG tablet; Take 1 tablet (5 mg total) by mouth every evening.  Dispense: 30 tablet; Refill: 3 ? ? ?Partially dictated using Editor, commissioning. Any errors are unintentional. ? ?[name], MD ?[facility] ?[facility] ? ?09/06/2021 ? ? ? ? ?

## 2021-09-06 NOTE — Telephone Encounter (Signed)
rx was sent to pharmacy by provider 09/06/21 #30/3, Receipt confirmed by pharmacy (09/06/2021 ?2:47 PM EDT) ?Requested Prescriptions  ?Pending Prescriptions Disp Refills  ?? levocetirizine (XYZAL) 5 MG tablet [Pharmacy Med Name: LEVOCETIRIZINE 5 MG TABLET] 90 tablet 1  ?  Sig: TAKE 1 TABLET BY MOUTH EVERY DAY IN THE EVENING  ?  ? Ear, Nose, and Throat:  Antihistamines - levocetirizine dihydrochloride Passed - 09/06/2021  1:08 PM  ?  ?  Passed - Cr in normal range and within 360 days  ?  Creatinine, Ser  ?Date Value Ref Range Status  ?06/28/2021 0.65 0.44 - 1.00 mg/dL Final  ?   ?  ?  Passed - eGFR is 10 or above and within 360 days  ?  GFR calc Af Amer  ?Date Value Ref Range Status  ?04/18/2020 102 >59 mL/min/1.73 Final  ?  Comment:  ?  **In accordance with recommendations from the NKF-ASN Task force,** ?  [facility] is in the process of updating its eGFR calculation to the ?  2021 CKD-EPI creatinine equation that estimates kidney function ?  without a race variable. ?  ? ?GFR, Estimated  ?Date Value Ref Range Status  ?06/28/2021 >60 >60 mL/min Final  ?  Comment:  ?  (NOTE) ?Calculated using the CKD-EPI Creatinine Equation (2021) ?  ? ?eGFR  ?Date Value Ref Range Status  ?04/24/2021 88 >59 mL/min/1.73 Final  ?   ?  ?  Passed - Valid encounter within last 12 months  ?  Recent Outpatient Visits   ?      ? Today Eschar of finger  ? [facility] Berglund, Laura H, MD  ? 1 week ago DM (diabetes mellitus), type 2 with complications ([facility])  ? [facility] Berglund, Laura H, MD  ? 2 months ago Neck muscle spasm  ? [facility] Berglund, Laura H, MD  ? 2 months ago Acute otitis media, unspecified otitis media type  ? [facility] Berglund, Laura H, MD  ? 4 months ago Annual physical exam  ? [facility] Berglund, Laura H, MD  ?  ?  ?Future Appointments   ?        ? In 4 months Berglund, Laura H, MD [facility]  ? In 7 months Berglund, Laura H, MD [facility]   ?  ? ?  ?  ?  ? ? ?

## 2021-09-06 NOTE — Progress Notes (Signed)
? ? ?Date:  09/06/2021  ? ?Name:  Kathy Dougherty   DOB:  11/10/1950   MRN:  [id number] ? ? ?Chief Complaint: Mass (Bump on thumb ) ? ?HPI ? ?Lab Results  ?Component Value Date  ? NA 141 06/28/2021  ? K 3.3 (L) 06/28/2021  ? CO2 32 06/28/2021  ? GLUCOSE 133 (H) 06/28/2021  ? BUN 12 06/28/2021  ? CREATININE 0.65 06/28/2021  ? CALCIUM 10.4 (H) 06/28/2021  ? EGFR 88 04/24/2021  ? GFRNONAA >60 06/28/2021  ? ?Lab Results  ?Component Value Date  ? CHOL 125 04/24/2021  ? HDL 47 04/24/2021  ? [place] 66 04/24/2021  ? TRIG 55 04/24/2021  ? CHOLHDL 2.7 04/24/2021  ? ?Lab Results  ?Component Value Date  ? TSH 1.530 04/24/2021  ? ?Lab Results  ?Component Value Date  ? HGBA1C 6.3 (H) 08/26/2021  ? ?Lab Results  ?Component Value Date  ? WBC 6.3 06/28/2021  ? HGB 13.1 06/28/2021  ? HCT 40.6 06/28/2021  ? MCV 86.8 06/28/2021  ? PLT 204 06/28/2021  ? ?Lab Results  ?Component Value Date  ? ALT 18 04/24/2021  ? AST 22 04/24/2021  ? ALKPHOS 64 04/24/2021  ? BILITOT 0.4 04/24/2021  ? ?No results found for: 25OHVITD2, [place], VD25OH  ? ?Review of Systems ? ?Patient Active Problem List  ? Diagnosis Date Noted  ? Aortic arch atherosclerosis ([place]) 07/02/2021  ? 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  ? Lipoma of lateral chest wall 04/18/2020  ? Porokeratosis 08/05/2019  ? Hyperlipidemia associated with type 2 diabetes mellitus ([place]) 03/26/2017  ? Iritis of left eye 03/26/2017  ? Pre-ulcerative calluses 03/26/2017  ? Arthritis of right knee 01/15/2016  ? DM (diabetes mellitus), type 2 with complications ([place]) 17/61/6073  ? Abnormal LFTs 03/15/2015  ? Essential (primary) hypertension 03/15/2015  ? Gastro-esophageal reflux disease without esophagitis 03/15/2015  ? Allergic rhinitis, seasonal 03/15/2015  ? ? ?Allergies  ?Allergen Reactions  ? Ace Inhibitors Cough  ? Metformin And Related Diarrhea  ? Penicillins Rash  ? Sulfa Antibiotics Rash  ? ? ?Past Surgical History:  ?Procedure Laterality Date  ? CATARACT EXTRACTION Right 08/2014  ? CATARACT  EXTRACTION Left 04/2016  ? HAMMER TOE SURGERY    ? PARTIAL HYSTERECTOMY    ? fibroids  ? ? ?Social History  ? ?Tobacco Use  ? Smoking status: Never  ? Smokeless tobacco: Never  ?Vaping Use  ? Vaping Use: Never used  ?Substance Use Topics  ? Alcohol use: No  ?  Alcohol/week: 0.0 standard drinks  ? Drug use: No  ? ? ? ?Medication list has been reviewed and updated. ? ?No outpatient medications have been marked as taking for the 09/06/21 encounter (Office Visit) with [name], MD.  ? ? ? ?  08/26/2021  ?  8:30 AM 07/02/2021  ?  1:55 PM 06/13/2021  ? 10:33 AM 04/24/2021  ?  8:52 AM  ?GAD 7 : Generalized Anxiety Score  ?Nervous, Anxious, on Edge 0 0 0 0  ?Control/stop worrying 0 0 0 0  ?Worry too much - different things 0 0 0 0  ?Trouble relaxing 0 0 0 0  ?Restless 0 0 0 0  ?Easily annoyed or irritable 0 0 0 0  ?Afraid - awful might happen 0 0 0 0  ?Total GAD 7 Score 0 0 0 0  ?Anxiety Difficulty Not difficult at all   Not difficult at all  ? ? ? ?  08/26/2021  ?  8:30 AM  ?  Depression screen PHQ 2/9  ?Decreased Interest 0  ?Down, Depressed, Hopeless 0  ?PHQ - 2 Score 0  ?Altered sleeping 0  ?Tired, decreased energy 0  ?Change in appetite 0  ?Feeling bad or failure about yourself  0  ?Trouble concentrating 0  ?Moving slowly or fidgety/restless 0  ?Suicidal thoughts 0  ?PHQ-9 Score 0  ?Difficult doing work/chores Not difficult at all  ? ? ?BP Readings from Last 3 Encounters:  ?08/26/21 128/62  ?07/02/21 122/78  ?06/28/21 (!) 150/96  ? ? ?Physical Exam ? ?Wt Readings from Last 3 Encounters:  ?08/26/21 158 lb (71.7 kg)  ?07/02/21 157 lb (71.2 kg)  ?06/28/21 150 lb (68 kg)  ? ? ?Ht $Rem'5\' 2"'ONzv$  (1.575 m)   BMI 28.90 kg/m?  ? ?Assessment and Plan: ? ? ? ? ?

## 2021-10-29 DIAGNOSIS — M7532 Calcific tendinitis of left shoulder: Secondary | ICD-10-CM | POA: Diagnosis not present

## 2021-10-29 DIAGNOSIS — M19012 Primary osteoarthritis, left shoulder: Secondary | ICD-10-CM | POA: Diagnosis not present

## 2021-10-29 DIAGNOSIS — M67912 Unspecified disorder of synovium and tendon, left shoulder: Secondary | ICD-10-CM | POA: Diagnosis not present

## 2021-10-29 DIAGNOSIS — M25512 Pain in left shoulder: Secondary | ICD-10-CM | POA: Diagnosis not present

## 2021-10-30 ENCOUNTER — Ambulatory Visit: Payer: Medicare HMO | Admitting: Internal Medicine

## 2021-11-01 ENCOUNTER — Ambulatory Visit (INDEPENDENT_AMBULATORY_CARE_PROVIDER_SITE_OTHER): Payer: Medicare HMO | Admitting: Internal Medicine

## 2021-11-01 ENCOUNTER — Encounter: Payer: Self-pay | Admitting: Internal Medicine

## 2021-11-01 VITALS — BP 136/78 | HR 88 | Ht 62.0 in | Wt 158.0 lb

## 2021-11-01 DIAGNOSIS — M25512 Pain in left shoulder: Secondary | ICD-10-CM

## 2021-11-01 MED ORDER — PREDNISONE 10 MG PO TABS: 10.0000 mg | ORAL_TABLET | ORAL | 0 refills | Status: AC

## 2021-11-01 NOTE — Progress Notes (Signed)
Date:  11/01/2021   Name:  Kathy Dougherty   DOB:  03-28-1951   MRN:  [id number]   Chief Complaint: Arm Pain  HPI Left shoulder pain: 71 y.o. female, comes in today with reports of gradual onset gradually worsening anterolateral left shoulder pain x2 weeks. No history of left shoulder injury or surgery. Patient is right-hand dominant. No numbness or weakness or tingling affected extremity. No redness or swelling or bruising or rash or skin break at site of pain. Pain is worse with attempts at raising of the arm, severe pain with attempts to extend arm to 90 degrees; pain worse with attempts at internal rotation. Taking ibuprofen without relief. Patient [facility] 3 days ago, and was told to take tylenol as needed for Mild Pain, Tramadol as needed for severe pain, and to follow up with Orthopedics since they placed a referral.  Lab Results  Component Value Date   NA 141 06/28/2021   K 3.3 (L) 06/28/2021   CO2 32 06/28/2021   GLUCOSE 133 (H) 06/28/2021   BUN 12 06/28/2021   CREATININE 0.65 06/28/2021   CALCIUM 10.4 (H) 06/28/2021   EGFR 88 04/24/2021   GFRNONAA >60 06/28/2021   Lab Results  Component Value Date   CHOL 125 04/24/2021   HDL 47 04/24/2021   LDLCALC 66 04/24/2021   TRIG 55 04/24/2021   CHOLHDL 2.7 04/24/2021   Lab Results  Component Value Date   TSH 1.530 04/24/2021   Lab Results  Component Value Date   HGBA1C 6.3 (H) 08/26/2021   Lab Results  Component Value Date   WBC 6.3 06/28/2021   HGB 13.1 06/28/2021   HCT 40.6 06/28/2021   MCV 86.8 06/28/2021   PLT 204 06/28/2021   Lab Results  Component Value Date   ALT 18 04/24/2021   AST 22 04/24/2021   ALKPHOS 64 04/24/2021   BILITOT 0.4 04/24/2021   No results found for: "25OHVITD2", "25OHVITD3", "VD25OH"   Review of Systems  Constitutional:  Negative for chills, fatigue and fever.  Respiratory:  Negative for chest tightness and shortness of breath.   Cardiovascular:  Negative for  chest pain.  Musculoskeletal:  Positive for arthralgias.  Neurological:  Negative for syncope, weakness, light-headedness and numbness.    Patient Active Problem List   Diagnosis Date Noted   Aortic arch atherosclerosis ([employer]) 07/02/2021   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   Lipoma of lateral chest wall 04/18/2020   Porokeratosis 08/05/2019   Hyperlipidemia associated with type 2 diabetes mellitus ([place]) 03/26/2017   Iritis of left eye 03/26/2017   Pre-ulcerative calluses 03/26/2017   Arthritis of right knee 01/15/2016   DM (diabetes mellitus), type 2 with complications ([place]) 42/70/6237   Abnormal LFTs 03/15/2015   Essential (primary) hypertension 03/15/2015   Gastro-esophageal reflux disease without esophagitis 03/15/2015   Allergic rhinitis, seasonal 03/15/2015    Allergies  Allergen Reactions   Ace Inhibitors Cough   Metformin And Related Diarrhea   Penicillins Rash   Sulfa Antibiotics Rash    Past Surgical History:  Procedure Laterality Date   CATARACT EXTRACTION Right 08/2014   CATARACT EXTRACTION Left 04/2016   HAMMER TOE SURGERY     PARTIAL HYSTERECTOMY     fibroids    Social History   Tobacco Use   Smoking status: Never   Smokeless tobacco: Never  Vaping Use   Vaping Use: Never used  Substance Use Topics   Alcohol use: No    Alcohol/week: 0.0  standard drinks of alcohol   Drug use: No     Medication list has been reviewed and updated.  Current Meds  Medication Sig   acetaminophen (TYLENOL) 500 MG tablet Take 500 mg by mouth every 6 (six) hours as needed.   amLODipine (NORVASC) 2.5 MG tablet TAKE 1 TABLET BY MOUTH EVERY DAY   Azelastine HCl 137 MCG/SPRAY SOLN Place into both nostrils.   blood glucose meter kit and supplies KIT Dispense based on patient and insurance preference. Test BS bid   dapagliflozin propanediol (FARXIGA) 5 MG TABS tablet Take 1 tablet (5 mg total) by mouth daily before breakfast.   docusate sodium (COLACE) 100 MG capsule Take 100  mg by mouth 2 (two) times daily as needed.   glimepiride (AMARYL) 4 MG tablet Take 1 tablet (4 mg total) by mouth daily before breakfast.   hydrochlorothiazide (HYDRODIURIL) 25 MG tablet Take 1 tablet (25 mg total) by mouth daily.   ibuprofen (ADVIL,MOTRIN) 600 MG tablet TAKE 1 TABLET (600 MG TOTAL) BY MOUTH EVERY 8 (EIGHT) HOURS AS NEEDED.   levocetirizine (XYZAL ALLERGY 24HR) 5 MG tablet Take 1 tablet (5 mg total) by mouth every evening.   losartan (COZAAR) 50 MG tablet Take 1 tablet (50 mg total) by mouth daily.   mometasone (NASONEX) 50 MCG/ACT nasal spray Place 2 sprays into the nose daily.   omeprazole (PRILOSEC) 40 MG capsule TAKE 1 CAPSULE BY MOUTH EVERY DAY   OneTouch Delica Lancets 75Z MISC TEST TWICE DAILY   ONETOUCH ULTRA test strip TEST TWICE DAILY   predniSONE (DELTASONE) 10 MG tablet Take 1 tablet (10 mg total) by mouth as directed for 6 days. Take 6,5,4,3,2,1 then stop   simvastatin (ZOCOR) 10 MG tablet TAKE 1 TABLET BY MOUTH EVERYDAY AT BEDTIME   traMADol (ULTRAM) 50 MG tablet Take 50 mg by mouth every 8 (eight) hours as needed.       11/01/2021   11:30 AM 09/06/2021    2:40 PM 08/26/2021    8:30 AM 07/02/2021    1:55 PM  GAD 7 : Generalized Anxiety Score  Nervous, Anxious, on Edge 0 0 0 0  Control/stop worrying 1 0 0 0  Worry too much - different things 1 0 0 0  Trouble relaxing 1 1 0 0  Restless 1 0 0 0  Easily annoyed or irritable 0 0 0 0  Afraid - awful might happen 0 0 0 0  Total GAD 7 Score 4 1 0 0  Anxiety Difficulty Not difficult at all  Not difficult at all        11/01/2021   11:29 AM  Depression screen PHQ 2/9  Decreased Interest 0  Down, Depressed, Hopeless 0  PHQ - 2 Score 0  Altered sleeping 2  Tired, decreased energy 1  Change in appetite 1  Feeling bad or failure about yourself  0  Trouble concentrating 0  Moving slowly or fidgety/restless 0  Suicidal thoughts 0  PHQ-9 Score 4  Difficult doing work/chores Not difficult at all    BP  Readings from Last 3 Encounters:  11/01/21 136/78  09/06/21 (!) 102/50  08/26/21 128/62    Physical Exam Vitals and nursing note reviewed.  Constitutional:      General: She is not in acute distress.    Appearance: She is well-developed.  HENT:     Head: Normocephalic and atraumatic.  Cardiovascular:     Rate and Rhythm: Normal rate and regular rhythm.  Pulmonary:  Effort: Pulmonary effort is normal. No respiratory distress.     Breath sounds: No wheezing or rhonchi.  Musculoskeletal:     Left shoulder: Swelling and bony tenderness present. No crepitus. Decreased range of motion. Normal strength.     Left upper arm: Swelling present. No lacerations.  Skin:    General: Skin is warm and dry.     Findings: No rash.  Neurological:     Mental Status: She is alert and oriented to person, place, and time.  Psychiatric:        Mood and Affect: Mood normal.        Behavior: Behavior normal.     Wt Readings from Last 3 Encounters:  11/01/21 158 lb (71.7 kg)  09/06/21 158 lb (71.7 kg)  08/26/21 158 lb (71.7 kg)    BP 136/78   Pulse 88   Ht _0  (1.575 m)   Wt 158 lb (71.7 kg)   SpO2 97%   BMI 28.90 kg/m   Assessment and Plan: 1. Acute pain of left shoulder With soft tissue swelling without warmth/tenderness Tramadol and Advil not effective Will give steroid taper and refer to Ortho. - Ambulatory referral to Orthopedic Surgery - predniSONE (DELTASONE) 10 MG tablet; Take 1 tablet (10 mg total) by mouth as directed for 6 days. Take 6,5,4,3,2,1 then stop  Dispense: 21 tablet; Refill: 0   Partially dictated using Editor, commissioning. Any errors are unintentional.  [name], MD [facility]  11/01/2021

## 2021-11-03 ENCOUNTER — Other Ambulatory Visit: Payer: Self-pay | Admitting: Internal Medicine

## 2021-11-03 DIAGNOSIS — E118 Type 2 diabetes mellitus with unspecified complications: Secondary | ICD-10-CM

## 2021-11-04 NOTE — Telephone Encounter (Signed)
Requested Prescriptions  Pending Prescriptions Disp Refills  . glimepiride (AMARYL) 4 MG tablet [Pharmacy Med Name: GLIMEPIRIDE 4 MG TABLET] 90 tablet 1    Sig: TAKE 1 TABLET BY MOUTH DAILY BEFORE BREAKFAST.     Endocrinology:  Diabetes - Sulfonylureas Passed - 11/03/2021  9:27 AM      Passed - HBA1C is between 0 and 7.9 and within 180 days    Hgb A1c MFr Bld  Date Value Ref Range Status  08/26/2021 6.3 (H) 4.8 - 5.6 % Final    Comment:             Prediabetes: 5.7 - 6.4          Diabetes: >6.4          Glycemic control for adults with diabetes: <7.0          Passed - Cr in normal range and within 360 days    Creatinine, Ser  Date Value Ref Range Status  06/28/2021 0.65 0.44 - 1.00 mg/dL Final         Passed - Valid encounter within last 6 months    Recent Outpatient Visits          3 days ago Acute pain of left shoulder   [facility] [name], MD   1 month ago Eschar of finger   [facility] [name], MD   2 months ago DM (diabetes mellitus), type 2 with complications [facility])   [place], [place]   4 months ago Neck muscle spasm   [place], MD   4 months ago Acute otitis media, unspecified otitis media type   [facility] [name], MD      Future Appointments            In 2 months [name] Jesse Sans, MD [facility], [place]   In 5 months [name] Jesse Sans, MD [facility], [facility]

## 2021-11-12 DIAGNOSIS — M25512 Pain in left shoulder: Secondary | ICD-10-CM | POA: Diagnosis not present

## 2021-11-12 DIAGNOSIS — M7552 Bursitis of left shoulder: Secondary | ICD-10-CM | POA: Diagnosis not present

## 2021-11-12 DIAGNOSIS — M7532 Calcific tendinitis of left shoulder: Secondary | ICD-10-CM | POA: Diagnosis not present

## 2021-11-12 DIAGNOSIS — M7542 Impingement syndrome of left shoulder: Secondary | ICD-10-CM | POA: Diagnosis not present

## 2021-11-12 DIAGNOSIS — M19012 Primary osteoarthritis, left shoulder: Secondary | ICD-10-CM | POA: Diagnosis not present

## 2021-12-20 ENCOUNTER — Other Ambulatory Visit: Payer: Self-pay | Admitting: Internal Medicine

## 2021-12-20 ENCOUNTER — Telehealth: Payer: Self-pay | Admitting: Internal Medicine

## 2021-12-20 DIAGNOSIS — M62838 Other muscle spasm: Secondary | ICD-10-CM

## 2021-12-20 DIAGNOSIS — J301 Allergic rhinitis due to pollen: Secondary | ICD-10-CM

## 2021-12-20 NOTE — Telephone Encounter (Signed)
Requested Prescriptions  Pending Prescriptions Disp Refills  . baclofen (LIORESAL) 20 MG tablet [Pharmacy Med Name: BACLOFEN 20 MG TABLET] 60 tablet     Sig: TAKE 1 TABLET BY MOUTH THREE TIMES A DAY     Analgesics:  Muscle Relaxants - baclofen Passed - 12/20/2021 12:07 PM      Passed - Cr in normal range and within 180 days    Creatinine, Ser  Date Value Ref Range Status  06/28/2021 0.65 0.44 - 1.00 mg/dL Final         Passed - eGFR is 30 or above and within 180 days    GFR calc Af Amer  Date Value Ref Range Status  04/18/2020 102 >59 mL/min/1.73 Final    Comment:    **In accordance with recommendations from the NKF-ASN Task force,**   [facility] is in the process of updating its eGFR calculation to the   2021 CKD-EPI creatinine equation that estimates kidney function   without a race variable.    GFR, Estimated  Date Value Ref Range Status  06/28/2021 >60 >60 mL/min Final    Comment:    (NOTE) Calculated using the CKD-EPI Creatinine Equation (2021)    eGFR  Date Value Ref Range Status  04/24/2021 88 >59 mL/min/1.73 Final         Passed - Valid encounter within last 6 months    Recent Outpatient Visits          1 month ago Acute pain of left shoulder   [facility] [name], MD   3 months ago Eschar of finger   [facility] [name], MD   3 months ago DM (diabetes mellitus), type 2 with complications [facility])   [facility] [name], [facility]   5 months ago Neck muscle spasm   [facility] [name], MD   6 months ago Acute otitis media, unspecified otitis media type   [facility] [name], MD      Future Appointments            In 2 weeks [name] Jesse Sans, MD [facility], [place]   In 4 months [name] Jesse Sans, MD [facility], [place]           . OneTouch Delica Lancets 28B MISC [Pharmacy Med Name: ONE TOUCH DELICA 15V LANCETS] 761 each 12    Sig: TEST TWICE A DAY      Endocrinology: Diabetes - Testing Supplies Passed - 12/20/2021 12:07 PM      Passed - Valid encounter within last 12 months    Recent Outpatient Visits          1 month ago Acute pain of left shoulder   [facility] [name], MD   3 months ago Eschar of finger   [facility] [name], MD   3 months ago DM (diabetes mellitus), type 2 with complications [facility])   [facility] [name], MD   5 months ago Neck muscle spasm   [facility] [name], MD   6 months ago Acute otitis media, unspecified otitis media type   [facility] [name], MD      Future Appointments            In 2 weeks [name], MD [facility], [place]   In 4 months [name] Jesse Sans, MD [facility], [place]           .  levocetirizine (XYZAL) 5 MG tablet [Pharmacy Med Name: LEVOCETIRIZINE 5 MG TABLET] 90 tablet 1    Sig: TAKE 1 TABLET BY MOUTH EVERY DAY IN THE EVENING     Ear, Nose, and Throat:  Antihistamines - levocetirizine dihydrochloride Passed - 12/20/2021 12:07 PM      Passed - Cr in normal range and within 360 days    Creatinine, Ser  Date Value Ref Range Status  06/28/2021 0.65 0.44 - 1.00 mg/dL Final         Passed - eGFR is 10 or above and within 360 days    GFR calc Af Amer  Date Value Ref Range Status  04/18/2020 102 >59 mL/min/1.73 Final    Comment:    **In accordance with recommendations from the NKF-ASN Task force,**   [facility] is in the process of updating its eGFR calculation to the   2021 CKD-EPI creatinine equation that estimates kidney function   without a race variable.    GFR, Estimated  Date Value Ref Range Status  06/28/2021 >60 >60 mL/min Final    Comment:    (NOTE) Calculated using the CKD-EPI Creatinine Equation (2021)    eGFR  Date Value Ref Range Status  04/24/2021 88 >59 mL/min/1.73 Final         Passed - Valid encounter within last 12 months    Recent  Outpatient Visits          1 month ago Acute pain of left shoulder   [facility] [name], MD   3 months ago Eschar of finger   [facility] [name], MD   3 months ago DM (diabetes mellitus), type 2 with complications [facility])   [facility] [name], MD   5 months ago Neck muscle spasm   [facility] [name], MD   6 months ago Acute otitis media, unspecified otitis media type   [facility] [name], MD      Future Appointments            In 2 weeks [name] Jesse Sans, MD [facility], [place]   In 4 months [name] Jesse Sans, MD [facility], [facility]

## 2021-12-20 NOTE — Telephone Encounter (Signed)
Noted  KP 

## 2021-12-20 NOTE — Telephone Encounter (Signed)
Requested Prescriptions  Pending Prescriptions Disp Refills  . OneTouch Delica Lancets 97Q MISC [Pharmacy Med Name: ONE TOUCH DELICA 73A LANCETS] 193 each 12    Sig: TEST TWICE A DAY     Endocrinology: Diabetes - Testing Supplies Passed - 12/20/2021  1:20 PM      Passed - Valid encounter within last 12 months    Recent Outpatient Visits          1 month ago Acute pain of left shoulder   [facility] [name], MD   3 months ago Eschar of finger   [facility] [name], MD   3 months ago DM (diabetes mellitus), type 2 with complications [facility])   [facility] [name], MD   5 months ago Neck muscle spasm   [facility] [name], MD   6 months ago Acute otitis media, unspecified otitis media type   [facility] [name], MD      Future Appointments            In 2 weeks [name] Jesse Sans, MD [facility], [place]   In 4 months [name] Jesse Sans, MD [facility], [facility]

## 2021-12-20 NOTE — Telephone Encounter (Signed)
Pt called to report that she did not request this Rx for Baclofen. She says this was a mistake made by the pharmacy

## 2021-12-20 NOTE — Telephone Encounter (Signed)
Requested medication (s) are due for refill today expired Rx, discontinued Rx  Requested medication (s) are on the active medication list -yes  Future visit scheduled -yes  Last refill: Baclofen- no longer on current medication list                  One Touch Delica [id number]-08/11/69 #100 12RF- expired Rx  Notes to clinic: see above  Requested Prescriptions  Pending Prescriptions Disp Refills   baclofen (LIORESAL) 20 MG tablet [Pharmacy Med Name: BACLOFEN 20 MG TABLET] 60 tablet     Sig: TAKE 1 TABLET BY MOUTH THREE TIMES A DAY     Analgesics:  Muscle Relaxants - baclofen Passed - 12/20/2021 12:07 PM      Passed - Cr in normal range and within 180 days    Creatinine, Ser  Date Value Ref Range Status  06/28/2021 0.65 0.44 - 1.00 mg/dL Final         Passed - eGFR is 30 or above and within 180 days    GFR calc Af Amer  Date Value Ref Range Status  04/18/2020 102 >59 mL/min/1.73 Final    Comment:    **In accordance with recommendations from the NKF-ASN Task force,**   [facility] is in the process of updating its eGFR calculation to the   2021 CKD-EPI creatinine equation that estimates kidney function   without a race variable.    GFR, Estimated  Date Value Ref Range Status  06/28/2021 >60 >60 mL/min Final    Comment:    (NOTE) Calculated using the CKD-EPI Creatinine Equation (2021)    eGFR  Date Value Ref Range Status  04/24/2021 88 >59 mL/min/1.73 Final         Passed - Valid encounter within last 6 months    Recent Outpatient Visits           1 month ago Acute pain of left shoulder   [facility] [name], MD   3 months ago Eschar of finger   [facility] [name], MD   3 months ago DM (diabetes mellitus), type 2 with complications [facility])   [facility] [name], MD   5 months ago Neck muscle spasm   [facility] [name], MD   6 months ago Acute otitis media, unspecified otitis media type    [facility] [name], MD       Future Appointments             In 2 weeks [name] Jesse Sans, MD [facility], [place]   In 4 months [name] Jesse Sans, MD [facility], [facility]             OneTouch Delica Lancets 24P MISC [place] Med Name: ONE TOUCH DELICA 80D LANCETS] 983 each 12    Sig: TEST TWICE A DAY     Endocrinology: Diabetes - Testing Supplies Passed - 12/20/2021 12:07 PM      Passed - Valid encounter within last 12 months    Recent Outpatient Visits           1 month ago Acute pain of left shoulder   [facility] [name], MD   3 months ago Eschar of finger   [facility] [name], MD   3 months ago DM (diabetes mellitus), type 2 with complications [facility])   [place], Laura H, MD   5 months ago Neck  muscle spasm   [facility] [name], MD   6 months ago Acute otitis media, unspecified otitis media type   [facility] [name], MD       Future Appointments             In 2 weeks [name], MD [facility], [facility]   In 4 months [name] Jesse Sans, MD [facility], [facility]            Signed Prescriptions Disp Refills   levocetirizine (XYZAL) 5 MG tablet 90 tablet 1    Sig: TAKE 1 TABLET BY MOUTH EVERY DAY IN THE EVENING     Ear, Nose, and Throat:  Antihistamines - levocetirizine dihydrochloride Passed - 12/20/2021 12:07 PM      Passed - Cr in normal range and within 360 days    Creatinine, Ser  Date Value Ref Range Status  06/28/2021 0.65 0.44 - 1.00 mg/dL Final         Passed - eGFR is 10 or above and within 360 days    GFR calc Af Amer  Date Value Ref Range Status  04/18/2020 102 >59 mL/min/1.73 Final    Comment:    **In accordance with recommendations from the NKF-ASN Task force,**   [facility] is in the process of updating its eGFR calculation to the   2021 CKD-EPI creatinine equation that estimates kidney  function   without a race variable.    GFR, Estimated  Date Value Ref Range Status  06/28/2021 >60 >60 mL/min Final    Comment:    (NOTE) Calculated using the CKD-EPI Creatinine Equation (2021)    eGFR  Date Value Ref Range Status  04/24/2021 88 >59 mL/min/1.73 Final         Passed - Valid encounter within last 12 months    Recent Outpatient Visits           1 month ago Acute pain of left shoulder   [facility] [name], MD   3 months ago Eschar of finger   [facility] [name], MD   3 months ago DM (diabetes mellitus), type 2 with complications [facility])   [facility] [name], MD   5 months ago Neck muscle spasm   [facility] [name], MD   6 months ago Acute otitis media, unspecified otitis media type   [facility] [name], MD       Future Appointments             In 2 weeks [name], MD [facility], [place]   In 4 months [name], MD [facility], [facility]               Requested Prescriptions  Pending Prescriptions Disp Refills   baclofen (LIORESAL) 20 MG tablet [Pharmacy Med Name: BACLOFEN 20 MG TABLET] 60 tablet     Sig: TAKE 1 TABLET BY MOUTH THREE TIMES A DAY     Analgesics:  Muscle Relaxants - baclofen Passed - 12/20/2021 12:07 PM      Passed - Cr in normal range and within 180 days    Creatinine, Ser  Date Value Ref Range Status  06/28/2021 0.65 0.44 - 1.00 mg/dL Final         Passed - eGFR is 30 or above and within 180 days    GFR calc Af Wyvonnia Lora  Date Value Ref  Range Status  04/18/2020 102 >59 mL/min/1.73 Final    Comment:    **In accordance with recommendations from the NKF-ASN Task force,**   [facility] is in the process of updating its eGFR calculation to the   2021 CKD-EPI creatinine equation that estimates kidney function   without a race variable.    GFR, Estimated  Date Value Ref Range Status  06/28/2021 >60 >60  mL/min Final    Comment:    (NOTE) Calculated using the CKD-EPI Creatinine Equation (2021)    eGFR  Date Value Ref Range Status  04/24/2021 88 >59 mL/min/1.73 Final         Passed - Valid encounter within last 6 months    Recent Outpatient Visits           1 month ago Acute pain of left shoulder   [facility] [name], MD   3 months ago Eschar of finger   [facility] [name], MD   3 months ago DM (diabetes mellitus), type 2 with complications [facility])   [facility] [name], MD   5 months ago Neck muscle spasm   [facility] [name], MD   6 months ago Acute otitis media, unspecified otitis media type   [facility] [name], MD       Future Appointments             In 2 weeks [name] Jesse Sans, MD [facility], [place]   In 4 months [name], Jesse Sans, MD [facility], [facility]             OneTouch Delica Lancets 75I MISC [Pharmacy Med Name: ONE TOUCH DELICA 43P LANCETS] 295 each 12    Sig: TEST TWICE A DAY     Endocrinology: Diabetes - Testing Supplies Passed - 12/20/2021 12:07 PM      Passed - Valid encounter within last 12 months    Recent Outpatient Visits           1 month ago Acute pain of left shoulder   [facility] [name], MD   3 months ago Eschar of finger   [facility] [name], MD   3 months ago DM (diabetes mellitus), type 2 with complications [facility])   [facility] [name], MD   5 months ago Neck muscle spasm   [facility] [name], MD   6 months ago Acute otitis media, unspecified otitis media type   [facility] [name], MD       Future Appointments             In 2 weeks [name], MD [facility], [place]   In 4 months [name], MD [facility], [facility]            Signed Prescriptions Disp Refills    levocetirizine (XYZAL) 5 MG tablet 90 tablet 1    Sig: TAKE 1 TABLET BY MOUTH EVERY DAY IN THE EVENING     Ear, Nose, and Throat:  Antihistamines - levocetirizine dihydrochloride Passed - 12/20/2021 12:07 PM      Passed - Cr in normal range and within 360 days    Creatinine, Ser  Date Value Ref Range Status  06/28/2021 0.65 0.44 - 1.00 mg/dL Final         Passed - eGFR is 10 or above and within 360  days    GFR calc Af Amer  Date Value Ref Range Status  04/18/2020 102 >59 mL/min/1.73 Final    Comment:    **In accordance with recommendations from the NKF-ASN Task force,**   [facility] is in the process of updating its eGFR calculation to the   2021 CKD-EPI creatinine equation that estimates kidney function   without a race variable.    GFR, Estimated  Date Value Ref Range Status  06/28/2021 >60 >60 mL/min Final    Comment:    (NOTE) Calculated using the CKD-EPI Creatinine Equation (2021)    eGFR  Date Value Ref Range Status  04/24/2021 88 >59 mL/min/1.73 Final         Passed - Valid encounter within last 12 months    Recent Outpatient Visits           1 month ago Acute pain of left shoulder   [facility] [name], MD   3 months ago Eschar of finger   [facility] [name], MD   3 months ago DM (diabetes mellitus), type 2 with complications [facility])   [facility] [name], MD   5 months ago Neck muscle spasm   [facility] [name], MD   6 months ago Acute otitis media, unspecified otitis media type   [facility] [name], MD       Future Appointments             In 2 weeks [name] Jesse Sans, MD [facility], [place]   In 4 months [name] Jesse Sans, MD [facility], [facility]

## 2022-01-07 ENCOUNTER — Encounter: Payer: Self-pay | Admitting: Internal Medicine

## 2022-01-07 ENCOUNTER — Other Ambulatory Visit
Admission: RE | Admit: 2022-01-07 | Discharge: 2022-01-07 | Disposition: A | Discharge status: Home / Self Care | Payer: Medicare HMO | Attending: Urology | Admitting: Urology

## 2022-01-07 ENCOUNTER — Ambulatory Visit (INDEPENDENT_AMBULATORY_CARE_PROVIDER_SITE_OTHER): Payer: Medicare HMO | Admitting: Internal Medicine

## 2022-01-07 VITALS — BP 128/70 | HR 74 | Ht 62.0 in | Wt 163.0 lb

## 2022-01-07 DIAGNOSIS — E118 Type 2 diabetes mellitus with unspecified complications: Secondary | ICD-10-CM | POA: Insufficient documentation

## 2022-01-07 DIAGNOSIS — I10 Essential (primary) hypertension: Secondary | ICD-10-CM

## 2022-01-07 LAB — BASIC METABOLIC PANEL
Anion gap: 7 (ref 5–15)
BUN: 19 mg/dL (ref 8–23)
CO2: 28 mmol/L (ref 22–32)
Calcium: 9.4 mg/dL (ref 8.9–10.3)
Chloride: 103 mmol/L (ref 98–111)
Creatinine, Ser: 0.61 mg/dL (ref 0.44–1.00)
GFR, Estimated: >60 mL/min (ref >60)
Glucose, Bld: 127 mg/dL — ABNORMAL HIGH (ref 70–99)
Potassium: 3.1 mmol/L — ABNORMAL LOW (ref 3.5–5.1)
Sodium: 138 mmol/L (ref 135–145)

## 2022-01-07 LAB — HEMOGLOBIN A1C
Hgb A1c MFr Bld: 6.3 % — ABNORMAL HIGH (ref 4.8–5.6)
Mean Plasma Glucose: 134.11 mg/dL

## 2022-01-07 NOTE — Progress Notes (Signed)
Date:  01/07/2022   Name:  Kathy Dougherty   DOB:  02-Feb-1951   MRN:  [id number]   Chief Complaint: Diabetes and Hypertension  Diabetes She presents for her follow-up diabetic visit. She has type 2 diabetes mellitus. Her disease course has been stable. Pertinent negatives for hypoglycemia include no dizziness, headaches or nervousness/anxiousness. Pertinent negatives for diabetes include no chest pain, no fatigue and no weakness. Symptoms are stable. Pertinent negatives for diabetic complications include no CVA. Current diabetic treatment includes oral agent (dual therapy) (farxiga and glimepiride). There is no change in her home blood glucose trend. An ACE inhibitor/angiotensin II receptor blocker is being taken. Eye exam is current.  Hypertension This is a chronic problem. The problem is controlled. Pertinent negatives include no chest pain, headaches, palpitations or shortness of breath. Past treatments include angiotensin blockers, diuretics and calcium channel blockers. There is no history of kidney disease, CAD/MI or CVA.  Hypercalcemia - mild noted on last labs. Not taking any calcium supplements but is on HCTZ.  Lab Results  Component Value Date   NA 141 06/28/2021   K 3.3 (L) 06/28/2021   CO2 32 06/28/2021   GLUCOSE 133 (H) 06/28/2021   BUN 12 06/28/2021   CREATININE 0.65 06/28/2021   CALCIUM 10.4 (H) 06/28/2021   EGFR 88 04/24/2021   GFRNONAA >60 06/28/2021   Lab Results  Component Value Date   CHOL 125 04/24/2021   HDL 47 04/24/2021   LDLCALC 66 04/24/2021   TRIG 55 04/24/2021   CHOLHDL 2.7 04/24/2021   Lab Results  Component Value Date   TSH 1.530 04/24/2021   Lab Results  Component Value Date   HGBA1C 6.3 (H) 08/26/2021   Lab Results  Component Value Date   WBC 6.3 06/28/2021   HGB 13.1 06/28/2021   HCT 40.6 06/28/2021   MCV 86.8 06/28/2021   PLT 204 06/28/2021   Lab Results  Component Value Date   ALT 18 04/24/2021   AST 22 04/24/2021   ALKPHOS 64  04/24/2021   BILITOT 0.4 04/24/2021   No results found for: "25OHVITD2", "25OHVITD3", "VD25OH"   Review of Systems  Constitutional:  Negative for fatigue and unexpected weight change.  HENT:  Negative for nosebleeds.   Eyes:  Negative for visual disturbance.  Respiratory:  Negative for cough, chest tightness, shortness of breath and wheezing.   Cardiovascular:  Negative for chest pain, palpitations and leg swelling.  Gastrointestinal:  Negative for abdominal pain, constipation and diarrhea.  Musculoskeletal:  Positive for arthralgias (shoulder pain).  Skin:        Persistent thickening of the soles of both feet with chronic discomfort   Neurological:  Negative for dizziness, weakness, light-headedness and headaches.  Psychiatric/Behavioral:  Negative for sleep disturbance. The patient is not nervous/anxious.     Patient Active Problem List   Diagnosis Date Noted   Aortic arch atherosclerosis ([place]) 07/02/2021   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   Lipoma of lateral chest wall 04/18/2020   Porokeratosis 08/05/2019   Hyperlipidemia associated with type 2 diabetes mellitus ([place]) 03/26/2017   Iritis of left eye 03/26/2017   Pre-ulcerative calluses 03/26/2017   Arthritis of right knee 01/15/2016   DM (diabetes mellitus), type 2 with complications ([place]) 50/01/3817   Abnormal LFTs 03/15/2015   Essential (primary) hypertension 03/15/2015   Gastro-esophageal reflux disease without esophagitis 03/15/2015   Allergic rhinitis, seasonal 03/15/2015    Allergies  Allergen Reactions   Ace Inhibitors Cough   Metformin And Related  Diarrhea   Penicillins Rash   Sulfa Antibiotics Rash    Past Surgical History:  Procedure Laterality Date   CATARACT EXTRACTION Right 08/2014   CATARACT EXTRACTION Left 04/2016   HAMMER TOE SURGERY     PARTIAL HYSTERECTOMY     fibroids    Social History   Tobacco Use   Smoking status: Never   Smokeless tobacco: Never  Vaping Use   Vaping Use: Never  used  Substance Use Topics   Alcohol use: No    Alcohol/week: 0.0 standard drinks of alcohol   Drug use: No     Medication list has been reviewed and updated.  Current Meds  Medication Sig   acetaminophen (TYLENOL) 500 MG tablet Take 500 mg by mouth every 6 (six) hours as needed.   amLODipine (NORVASC) 2.5 MG tablet TAKE 1 TABLET BY MOUTH EVERY DAY   Azelastine HCl 137 MCG/SPRAY SOLN Place into both nostrils.   blood glucose meter kit and supplies KIT Dispense based on patient and insurance preference. Test BS bid   dapagliflozin propanediol (FARXIGA) 5 MG TABS tablet Take 1 tablet (5 mg total) by mouth daily before breakfast.   docusate sodium (COLACE) 100 MG capsule Take 100 mg by mouth 2 (two) times daily as needed.   glimepiride (AMARYL) 4 MG tablet TAKE 1 TABLET BY MOUTH DAILY BEFORE BREAKFAST.   hydrochlorothiazide (HYDRODIURIL) 25 MG tablet Take 1 tablet (25 mg total) by mouth daily.   ibuprofen (ADVIL,MOTRIN) 600 MG tablet TAKE 1 TABLET (600 MG TOTAL) BY MOUTH EVERY 8 (EIGHT) HOURS AS NEEDED.   levocetirizine (XYZAL) 5 MG tablet TAKE 1 TABLET BY MOUTH EVERY DAY IN THE EVENING   losartan (COZAAR) 50 MG tablet Take 1 tablet (50 mg total) by mouth daily.   mometasone (NASONEX) 50 MCG/ACT nasal spray Place 2 sprays into the nose daily.   omeprazole (PRILOSEC) 40 MG capsule TAKE 1 CAPSULE BY MOUTH EVERY DAY   OneTouch Delica Lancets 03T MISC TEST TWICE A DAY   ONETOUCH ULTRA test strip TEST TWICE DAILY   simvastatin (ZOCOR) 10 MG tablet TAKE 1 TABLET BY MOUTH EVERYDAY AT BEDTIME   traMADol (ULTRAM) 50 MG tablet Take 50 mg by mouth every 8 (eight) hours as needed.       01/07/2022    9:55 AM 11/01/2021   11:30 AM 09/06/2021    2:40 PM 08/26/2021    8:30 AM  GAD 7 : Generalized Anxiety Score  Nervous, Anxious, on Edge 0 0 0 0  Control/stop worrying 0 1 0 0  Worry too much - different things 1 1 0 0  Trouble relaxing 0 1 1 0  Restless 0 1 0 0  Easily annoyed or irritable 0 0 0  0  Afraid - awful might happen 0 0 0 0  Total GAD 7 Score _0 0  Anxiety Difficulty Not difficult at all Not difficult at all  Not difficult at all       01/07/2022    9:55 AM 11/01/2021   11:29 AM 09/06/2021    2:40 PM  Depression screen PHQ 2/9  Decreased Interest 0 0 0  Down, Depressed, Hopeless 0 0 0  PHQ - 2 Score 0 0 0  Altered sleeping 1 2 0  Tired, decreased energy 0 1 0  Change in appetite _1 Feeling bad or failure about yourself  0 0 0  Trouble concentrating  0 0  Moving slowly or fidgety/restless 0 0 0  Suicidal thoughts 0 0 0  PHQ-9 Score _0 Difficult doing work/chores Not difficult at all Not difficult at all Not difficult at all    BP Readings from Last 3 Encounters:  01/07/22 128/70  11/01/21 136/78  09/06/21 (!) 102/50    Physical Exam Vitals and nursing note reviewed.  Constitutional:      General: She is not in acute distress.    Appearance: Normal appearance. She is well-developed.  HENT:     Head: Normocephalic and atraumatic.  Cardiovascular:     Rate and Rhythm: Normal rate and regular rhythm.     Pulses: Normal pulses.  Pulmonary:     Effort: Pulmonary effort is normal. No respiratory distress.  Musculoskeletal:     Cervical back: Normal range of motion.     Right lower leg: No edema.     Left lower leg: No edema.  Skin:    General: Skin is warm and dry.     Findings: No rash.  Neurological:     Mental Status: She is alert and oriented to person, place, and time.  Psychiatric:        Mood and Affect: Mood normal.        Behavior: Behavior normal.    Diabetic Foot Exam - Simple   Simple Foot Form Diabetic Foot exam was performed with the following findings: Yes 01/07/2022 10:07 AM  Visual Inspection No deformities, no ulcerations, no other skin breakdown bilaterally: Yes Sensation Testing Intact to touch and monofilament testing bilaterally: Yes Pulse Check Posterior Tibialis and Dorsalis pulse intact bilaterally:  Yes Comments Thick callus on soles of both feet      Wt Readings from Last 3 Encounters:  01/07/22 163 lb (73.9 kg)  11/01/21 158 lb (71.7 kg)  09/06/21 158 lb (71.7 kg)    BP 128/70   Pulse 74   Ht 5' 2" (1.575 m)   Wt 163 lb (73.9 kg)   SpO2 95%   BMI 29.81 kg/m   Assessment and Plan: 1. Essential (primary) hypertension Clinically stable exam with well controlled BP. Tolerating medications without side effects at this time. Pt to continue current regimen and low sodium diet; benefits of regular exercise as able discussed.  2. DM (diabetes mellitus), type 2 with complications ([place]) Clinically stable by exam and report without s/s of hypoglycemia. DM complicated by hypertension and dyslipidemia. Tolerating medications well without side effects or other concerns. She has gained a few pounds - encouraged dietary modifications - Hemoglobin A1c  3. Hypercalcemia Noted on last visit - will recheck and consider stopping HCTZ - Basic metabolic panel   Partially dictated using Dragon software. Any errors are unintentional.  [name], MD [facility]  01/07/2022

## 2022-01-08 ENCOUNTER — Telehealth: Payer: Self-pay

## 2022-01-08 NOTE — Telephone Encounter (Signed)
Pt given lab results per notes of Dr. [name] on 01/08/22. Pt verbalized understanding.

## 2022-01-09 NOTE — Progress Notes (Signed)
Called pt could not leave VM. Phone was busy.  [facility] nurse may give results to patient if they return call to clinic, a CRM has been created.  KP

## 2022-02-18 DIAGNOSIS — Z961 Presence of intraocular lens: Secondary | ICD-10-CM | POA: Diagnosis not present

## 2022-02-18 DIAGNOSIS — E119 Type 2 diabetes mellitus without complications: Secondary | ICD-10-CM | POA: Diagnosis not present

## 2022-02-18 LAB — HM DIABETES EYE EXAM

## 2022-02-21 ENCOUNTER — Ambulatory Visit (INDEPENDENT_AMBULATORY_CARE_PROVIDER_SITE_OTHER): Payer: Medicare HMO | Admitting: Internal Medicine

## 2022-02-21 ENCOUNTER — Encounter: Payer: Self-pay | Admitting: Internal Medicine

## 2022-02-21 VITALS — BP 112/72 | HR 67 | Temp 98.3°F | Ht 62.0 in | Wt 166.4 lb

## 2022-02-21 DIAGNOSIS — E118 Type 2 diabetes mellitus with unspecified complications: Secondary | ICD-10-CM | POA: Diagnosis not present

## 2022-02-21 DIAGNOSIS — J0100 Acute maxillary sinusitis, unspecified: Secondary | ICD-10-CM | POA: Diagnosis not present

## 2022-02-21 DIAGNOSIS — I10 Essential (primary) hypertension: Secondary | ICD-10-CM

## 2022-02-21 MED ORDER — HYDROCHLOROTHIAZIDE 25 MG PO TABS: 25.0000 mg | ORAL_TABLET | Freq: Every day | ORAL | 1 refills | Status: DC

## 2022-02-21 MED ORDER — AZITHROMYCIN 250 MG PO TABS: ORAL_TABLET | ORAL | 0 refills | Status: AC

## 2022-02-21 NOTE — Progress Notes (Signed)
Date:  02/21/2022   Name:  Kathy Dougherty   DOB:  1950-08-31   MRN:  [id number]   Chief Complaint: Sinusitis  Sinusitis This is a new problem. The current episode started in the past 7 days. The problem has been waxing and waning since onset. There has been no fever. Associated symptoms include congestion, coughing, headaches, sinus pressure and a sore throat. Pertinent negatives include no chills or shortness of breath.    Lab Results  Component Value Date   NA 138 01/07/2022   K 3.1 (L) 01/07/2022   CO2 28 01/07/2022   GLUCOSE 127 (H) 01/07/2022   BUN 19 01/07/2022   CREATININE 0.61 01/07/2022   CALCIUM 9.4 01/07/2022   EGFR 88 04/24/2021   GFRNONAA >60 01/07/2022   Lab Results  Component Value Date   CHOL 125 04/24/2021   HDL 47 04/24/2021   LDLCALC 66 04/24/2021   TRIG 55 04/24/2021   CHOLHDL 2.7 04/24/2021   Lab Results  Component Value Date   TSH 1.530 04/24/2021   Lab Results  Component Value Date   HGBA1C 6.3 (H) 01/07/2022   Lab Results  Component Value Date   WBC 6.3 06/28/2021   HGB 13.1 06/28/2021   HCT 40.6 06/28/2021   MCV 86.8 06/28/2021   PLT 204 06/28/2021   Lab Results  Component Value Date   ALT 18 04/24/2021   AST 22 04/24/2021   ALKPHOS 64 04/24/2021   BILITOT 0.4 04/24/2021   No results found for: "25OHVITD2", "25OHVITD3", "VD25OH"   Review of Systems  Constitutional:  Negative for chills, fatigue and fever.  HENT:  Positive for congestion, sinus pressure and sore throat. Negative for trouble swallowing.   Respiratory:  Positive for cough. Negative for chest tightness, shortness of breath and wheezing.   Cardiovascular:  Negative for chest pain and palpitations.  Gastrointestinal:  Negative for abdominal pain.  Musculoskeletal:  Negative for arthralgias and myalgias.  Neurological:  Positive for headaches.  Psychiatric/Behavioral:  Negative for dysphoric mood and sleep disturbance. The patient is not nervous/anxious.      Patient Active Problem List   Diagnosis Date Noted   Hypercalcemia 01/07/2022   Aortic arch atherosclerosis (HCC) 07/02/2021   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   Lipoma of lateral chest wall 04/18/2020   Porokeratosis 08/05/2019   Hyperlipidemia associated with type 2 diabetes mellitus (HCC) 03/26/2017   Iritis of left eye 03/26/2017   Pre-ulcerative calluses 03/26/2017   Arthritis of right knee 01/15/2016   DM (diabetes mellitus), type 2 with complications (HCC) 03/15/2015   Abnormal LFTs 03/15/2015   Essential (primary) hypertension 03/15/2015   Gastro-esophageal reflux disease without esophagitis 03/15/2015   Allergic rhinitis, seasonal 03/15/2015    Allergies  Allergen Reactions   Ace Inhibitors Cough   Metformin And Related Diarrhea   Penicillins Rash   Sulfa Antibiotics Rash    Past Surgical History:  Procedure Laterality Date   CATARACT EXTRACTION Right 08/2014   CATARACT EXTRACTION Left 04/2016   HAMMER TOE SURGERY     PARTIAL HYSTERECTOMY     fibroids    Social History   Tobacco Use   Smoking status: Never   Smokeless tobacco: Never  Vaping Use   Vaping Use: Never used  Substance Use Topics   Alcohol use: No    Alcohol/week: 0.0 standard drinks of alcohol   Drug use: No     Medication list has been reviewed and updated.  Current Meds  Medication Sig   acetaminophen (  TYLENOL) 500 MG tablet Take 500 mg by mouth every 6 (six) hours as needed.   amLODipine (NORVASC) 2.5 MG tablet TAKE 1 TABLET BY MOUTH EVERY DAY   Azelastine HCl 137 MCG/SPRAY SOLN Place into both nostrils.   blood glucose meter kit and supplies KIT Dispense based on patient and insurance preference. Test BS bid   dapagliflozin propanediol (FARXIGA) 5 MG TABS tablet Take 1 tablet (5 mg total) by mouth daily before breakfast.   docusate sodium (COLACE) 100 MG capsule Take 100 mg by mouth 2 (two) times daily as needed.   glimepiride (AMARYL) 4 MG tablet TAKE 1 TABLET BY MOUTH DAILY  BEFORE BREAKFAST.   hydrochlorothiazide (HYDRODIURIL) 25 MG tablet Take 1 tablet (25 mg total) by mouth daily.   ibuprofen (ADVIL,MOTRIN) 600 MG tablet TAKE 1 TABLET (600 MG TOTAL) BY MOUTH EVERY 8 (EIGHT) HOURS AS NEEDED.   levocetirizine (XYZAL) 5 MG tablet TAKE 1 TABLET BY MOUTH EVERY DAY IN THE EVENING   losartan (COZAAR) 50 MG tablet Take 1 tablet (50 mg total) by mouth daily.   mometasone (NASONEX) 50 MCG/ACT nasal spray Place 2 sprays into the nose daily.   omeprazole (PRILOSEC) 40 MG capsule TAKE 1 CAPSULE BY MOUTH EVERY DAY   OneTouch Delica Lancets 20N MISC TEST TWICE A DAY   ONETOUCH ULTRA test strip TEST TWICE DAILY   simvastatin (ZOCOR) 10 MG tablet TAKE 1 TABLET BY MOUTH EVERYDAY AT BEDTIME   traMADol (ULTRAM) 50 MG tablet Take 50 mg by mouth every 8 (eight) hours as needed.       01/07/2022    9:55 AM 11/01/2021   11:30 AM 09/06/2021    2:40 PM 08/26/2021    8:30 AM  GAD 7 : Generalized Anxiety Score  Nervous, Anxious, on Edge 0 0 0 0  Control/stop worrying 0 1 0 0  Worry too much - different things 1 1 0 0  Trouble relaxing 0 1 1 0  Restless 0 1 0 0  Easily annoyed or irritable 0 0 0 0  Afraid - awful might happen 0 0 0 0  Total GAD 7 Score $Remov'1 4 1 'ezPKvZ$ 0  Anxiety Difficulty Not difficult at all Not difficult at all  Not difficult at all       01/07/2022    9:55 AM 11/01/2021   11:29 AM 09/06/2021    2:40 PM  Depression screen PHQ 2/9  Decreased Interest 0 0 0  Down, Depressed, Hopeless 0 0 0  PHQ - 2 Score 0 0 0  Altered sleeping 1 2 0  Tired, decreased energy 0 1 0  Change in appetite $RemoveBef'1 1 1  'AsQFrMCtwX$ Feeling bad or failure about yourself  0 0 0  Trouble concentrating  0 0  Moving slowly or fidgety/restless 0 0 0  Suicidal thoughts 0 0 0  PHQ-9 Score $RemoveBef'2 4 1  'DOiVrKPUUn$ Difficult doing work/chores Not difficult at all Not difficult at all Not difficult at all    BP Readings from Last 3 Encounters:  02/21/22 112/72  01/07/22 128/70  11/01/21 136/78    Physical  Exam Constitutional:      Appearance: She is well-developed.  HENT:     Right Ear: Ear canal and external ear normal. Tympanic membrane is not erythematous or retracted.     Left Ear: Ear canal and external ear normal. Tympanic membrane is not erythematous or retracted.     Nose:     Right Sinus: Maxillary sinus tenderness and frontal sinus tenderness  present.     Left Sinus: Maxillary sinus tenderness and frontal sinus tenderness present.     Mouth/Throat:     Mouth: No oral lesions.     Pharynx: Uvula midline. No oropharyngeal exudate or posterior oropharyngeal erythema.  Cardiovascular:     Rate and Rhythm: Normal rate and regular rhythm.     Heart sounds: Normal heart sounds.  Pulmonary:     Breath sounds: Normal breath sounds. No wheezing or rales.  Lymphadenopathy:     Cervical: No cervical adenopathy.  Neurological:     Mental Status: She is alert and oriented to person, place, and time.     Wt Readings from Last 3 Encounters:  02/21/22 166 lb 6.4 oz (75.5 kg)  01/07/22 163 lb (73.9 kg)  11/01/21 158 lb (71.7 kg)    BP 112/72   Pulse 67   Temp 98.3 F (36.8 C) (Oral)   Ht $R'5\' 2"'Oq$  (1.575 m)   Wt 166 lb 6.4 oz (75.5 kg)   SpO2 96%   BMI 30.43 kg/m   Assessment and Plan: 1. Acute non-recurrent maxillary sinusitis Recommend Coricidin HBP for symptom relief Push fluids - azithromycin (ZITHROMAX Z-PAK) 250 MG tablet; UAD  Dispense: 6 each; Refill: 0  2. Essential (primary) hypertension Clinically stable exam with well controlled BP. Tolerating medications without side effects at this time. Pt to continue current regimen and low sodium diet; benefits of regular exercise as able discussed. - hydrochlorothiazide (HYDRODIURIL) 25 MG tablet; Take 1 tablet (25 mg total) by mouth daily.  Dispense: 90 tablet; Refill: 1  3. DM (diabetes mellitus), type 2 with complications ([place]) Due to have medications from [place] sent - she will call to see what she needs to do to get  [place].   Partially dictated using Editor, commissioning. Any errors are unintentional.  [name], MD [facility]  02/21/2022

## 2022-02-21 NOTE — Patient Instructions (Addendum)
Coricidin HBP - decongestant that does not raise your BP.  Call [place] about the [place] patient assistance program.

## 2022-02-26 ENCOUNTER — Other Ambulatory Visit: Payer: Self-pay

## 2022-02-26 ENCOUNTER — Telehealth: Payer: Self-pay | Admitting: Internal Medicine

## 2022-02-26 DIAGNOSIS — E118 Type 2 diabetes mellitus with unspecified complications: Secondary | ICD-10-CM

## 2022-02-26 NOTE — Telephone Encounter (Signed)
Prescription written and put in Dr Gaspar Cola box to be signed and faxed. - Leonilda Cozby

## 2022-02-26 NOTE — Telephone Encounter (Signed)
Pt called requesting to have her prescription faxed to [facility] for her dapagliflozin propanediol (FARXIGA) 5 MG TABS tablet Fax: [phone]

## 2022-02-28 NOTE — Telephone Encounter (Signed)
[facility] received the script for  dapagliflozin propanediol (FARXIGA) 5 MG TABS tablet but it didn't have the quantity or refill amount / please update and refax to [phone]

## 2022-02-28 NOTE — Telephone Encounter (Signed)
Re-faxed Rx with pill amount and Refills.

## 2022-03-12 ENCOUNTER — Ambulatory Visit (INDEPENDENT_AMBULATORY_CARE_PROVIDER_SITE_OTHER): Payer: Medicare HMO

## 2022-03-12 DIAGNOSIS — Z23 Encounter for immunization: Secondary | ICD-10-CM | POA: Diagnosis not present

## 2022-04-28 ENCOUNTER — Encounter: Payer: Self-pay | Admitting: Internal Medicine

## 2022-04-28 ENCOUNTER — Ambulatory Visit (INDEPENDENT_AMBULATORY_CARE_PROVIDER_SITE_OTHER): Payer: Medicare HMO | Admitting: Internal Medicine

## 2022-04-28 VITALS — BP 114/70 | HR 78 | Ht 62.0 in | Wt 169.0 lb

## 2022-04-28 DIAGNOSIS — Z0000 Encounter for general adult medical examination without abnormal findings: Secondary | ICD-10-CM

## 2022-04-28 DIAGNOSIS — E785 Hyperlipidemia, unspecified: Secondary | ICD-10-CM

## 2022-04-28 DIAGNOSIS — E118 Type 2 diabetes mellitus with unspecified complications: Secondary | ICD-10-CM

## 2022-04-28 DIAGNOSIS — E1169 Type 2 diabetes mellitus with other specified complication: Secondary | ICD-10-CM | POA: Diagnosis not present

## 2022-04-28 DIAGNOSIS — J301 Allergic rhinitis due to pollen: Secondary | ICD-10-CM

## 2022-04-28 DIAGNOSIS — Z1231 Encounter for screening mammogram for malignant neoplasm of breast: Secondary | ICD-10-CM

## 2022-04-28 DIAGNOSIS — I10 Essential (primary) hypertension: Secondary | ICD-10-CM

## 2022-04-28 LAB — POCT URINALYSIS DIPSTICK
Bilirubin, UA: NEGATIVE
Blood, UA: NEGATIVE
Glucose, UA: POSITIVE — AB
Ketones, UA: NEGATIVE
Leukocytes, UA: NEGATIVE
Nitrite, UA: NEGATIVE
Protein, UA: NEGATIVE
Spec Grav, UA: 1.01 (ref 1.010–1.025)
Urobilinogen, UA: 0.2 E.U./dL
pH, UA: 6 (ref 5.0–8.0)

## 2022-04-28 MED ORDER — LOSARTAN POTASSIUM 50 MG PO TABS: 50.0000 mg | ORAL_TABLET | Freq: Every day | ORAL | 3 refills | Status: DC

## 2022-04-28 MED ORDER — AMLODIPINE BESYLATE 2.5 MG PO TABS: 2.5000 mg | ORAL_TABLET | Freq: Every day | ORAL | 3 refills | Status: DC

## 2022-04-28 MED ORDER — SIMVASTATIN 10 MG PO TABS: ORAL_TABLET | ORAL | 3 refills | Status: DC

## 2022-04-28 MED ORDER — LEVOCETIRIZINE DIHYDROCHLORIDE 5 MG PO TABS: 5.0000 mg | ORAL_TABLET | Freq: Every evening | ORAL | 3 refills | Status: DC

## 2022-04-28 MED ORDER — GLIMEPIRIDE 4 MG PO TABS: 4.0000 mg | ORAL_TABLET | Freq: Every day | ORAL | 3 refills | Status: DC

## 2022-04-28 NOTE — Progress Notes (Signed)
Date:  04/28/2022   Name:  Kathy Dougherty   DOB:  07/05/1950   MRN:  [id number]   Chief Complaint: Annual Exam Kathy Dougherty is a 71 y.o. female who presents today for her Complete Annual Exam. She feels well. She reports exercising. She reports she is sleeping well. Breast complaints - none.  Mammogram: 04/2021 DEXA: 08/2017 osteopenia Pap smear: discontinued Colonoscopy: 11/2012 repeat 10 yrs ([facility])  Health Maintenance Due  Topic Date Due   DTaP/Tdap/Td (1 - Tdap) Never done   Zoster Vaccines- Shingrix (1 of 2) Never done   OPHTHALMOLOGY EXAM  01/15/2022   COVID-19 Vaccine (5 - 2023-24 season) 01/17/2022   Medicare Annual Wellness (AWV)  05/01/2022    Immunization History  Administered Date(s) Administered   Fluad Quad(high Dose 65+) 02/18/2019, 03/13/2020, 01/28/2021, 03/12/2022   Influenza, High Dose Seasonal PF 03/23/2017, 03/22/2018   Influenza,inj,Quad PF,6+ Mos 03/19/2015, 03/20/2016   Moderna Covid-19 Vaccine Bivalent Booster 63yr & up 03/01/2021   Moderna Sars-Covid-2 Vaccination 06/24/2019, 07/26/2019, 03/19/2020   Pneumococcal Conjugate-13 03/20/2016   Pneumococcal Polysaccharide-23 03/23/2017   Zoster, Live 11/05/2012    Hypertension This is a chronic problem. The problem is controlled. Pertinent negatives include no chest pain, headaches, palpitations or shortness of breath. Past treatments include calcium channel blockers, diuretics and angiotensin blockers. There is no history of kidney disease, CAD/MI or CVA.  Diabetes She presents for her follow-up diabetic visit. She has type 2 diabetes mellitus. Her disease course has been stable. Pertinent negatives for hypoglycemia include no dizziness, headaches, nervousness/anxiousness or tremors. Pertinent negatives for diabetes include no chest pain, no fatigue, no polydipsia and no polyuria. Pertinent negatives for diabetic complications include no CVA. Current diabetic treatments: glipizide, farxiga. An ACE  inhibitor/angiotensin II receptor blocker is being taken. Eye exam is current.  Hyperlipidemia This is a chronic problem. The problem is controlled. Pertinent negatives include no chest pain or shortness of breath. Current antihyperlipidemic treatment includes statins. The current treatment provides significant improvement of lipids.  Gastroesophageal Reflux She reports no abdominal pain, no chest pain, no coughing, no heartburn or no wheezing. Pertinent negatives include no fatigue. She has tried a PPI for the symptoms. The treatment provided significant relief.    Lab Results  Component Value Date   NA 138 01/07/2022   K 3.1 (L) 01/07/2022   CO2 28 01/07/2022   GLUCOSE 127 (H) 01/07/2022   BUN 19 01/07/2022   CREATININE 0.61 01/07/2022   CALCIUM 9.4 01/07/2022   EGFR 88 04/24/2021   GFRNONAA >60 01/07/2022   Lab Results  Component Value Date   CHOL 125 04/24/2021   HDL 47 04/24/2021   LDLCALC 66 04/24/2021   TRIG 55 04/24/2021   CHOLHDL 2.7 04/24/2021   Lab Results  Component Value Date   TSH 1.530 04/24/2021   Lab Results  Component Value Date   HGBA1C 6.3 (H) 01/07/2022   Lab Results  Component Value Date   WBC 6.3 06/28/2021   HGB 13.1 06/28/2021   HCT 40.6 06/28/2021   MCV 86.8 06/28/2021   PLT 204 06/28/2021   Lab Results  Component Value Date   ALT 18 04/24/2021   AST 22 04/24/2021   ALKPHOS 64 04/24/2021   BILITOT 0.4 04/24/2021   No results found for: "25OHVITD2", "25OHVITD3", "VD25OH"   Review of Systems  Constitutional:  Negative for chills, fatigue and fever.  HENT:  Negative for congestion, hearing loss, tinnitus, trouble swallowing and voice change.   Eyes:  Negative for  visual disturbance.  Respiratory:  Negative for cough, chest tightness, shortness of breath and wheezing.   Cardiovascular:  Negative for chest pain, palpitations and leg swelling.  Gastrointestinal:  Negative for abdominal pain, constipation, diarrhea, heartburn and vomiting.   Endocrine: Negative for polydipsia and polyuria.  Genitourinary:  Negative for dysuria, frequency, genital sores, vaginal bleeding and vaginal discharge.  Musculoskeletal:  Negative for arthralgias, gait problem and joint swelling.  Skin:  Negative for color change and rash.  Neurological:  Negative for dizziness, tremors, light-headedness and headaches.  Hematological:  Negative for adenopathy. Does not bruise/bleed easily.  Psychiatric/Behavioral:  Negative for dysphoric mood and sleep disturbance. The patient is not nervous/anxious.     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   Lipoma of lateral chest wall 04/18/2020   Porokeratosis 08/05/2019   Hyperlipidemia associated with type 2 diabetes mellitus ([place]) 03/26/2017   Iritis of left eye 03/26/2017   Pre-ulcerative calluses 03/26/2017   Arthritis of right knee 01/15/2016   DM (diabetes mellitus), type 2 with complications ([place]) 66/29/4765   Abnormal LFTs 03/15/2015   Essential (primary) hypertension 03/15/2015   Gastro-esophageal reflux disease without esophagitis 03/15/2015   Allergic rhinitis, seasonal 03/15/2015    Allergies  Allergen Reactions   Ace Inhibitors Cough   Metformin And Related Diarrhea   Penicillins Rash   Sulfa Antibiotics Rash    Past Surgical History:  Procedure Laterality Date   CATARACT EXTRACTION Right 08/2014   CATARACT EXTRACTION Left 04/2016   HAMMER TOE SURGERY     PARTIAL HYSTERECTOMY     fibroids    Social History   Tobacco Use   Smoking status: Never   Smokeless tobacco: Never  Vaping Use   Vaping Use: Never used  Substance Use Topics   Alcohol use: No    Alcohol/week: 0.0 standard drinks of alcohol   Drug use: No     Medication list has been reviewed and updated.  Current Meds  Medication Sig   acetaminophen (TYLENOL) 500 MG tablet Take 500 mg by mouth  every 6 (six) hours as needed.   Azelastine HCl 137 MCG/SPRAY SOLN Place into both nostrils.   blood glucose meter kit and supplies KIT Dispense based on patient and insurance preference. Test BS bid   dapagliflozin propanediol (FARXIGA) 5 MG TABS tablet Take 1 tablet (5 mg total) by mouth daily before breakfast.   docusate sodium (COLACE) 100 MG capsule Take 100 mg by mouth 2 (two) times daily as needed.   hydrochlorothiazide (HYDRODIURIL) 25 MG tablet Take 1 tablet (25 mg total) by mouth daily.   ibuprofen (ADVIL,MOTRIN) 600 MG tablet TAKE 1 TABLET (600 MG TOTAL) BY MOUTH EVERY 8 (EIGHT) HOURS AS NEEDED.   mometasone (NASONEX) 50 MCG/ACT nasal spray Place 2 sprays into the nose daily.   omeprazole (PRILOSEC) 40 MG capsule TAKE 1 CAPSULE BY MOUTH EVERY DAY   OneTouch Delica Lancets 46T MISC TEST TWICE A DAY   ONETOUCH ULTRA test strip TEST TWICE DAILY   [DISCONTINUED] amLODipine (NORVASC) 2.5 MG tablet TAKE 1 TABLET BY MOUTH EVERY DAY   [DISCONTINUED] glimepiride (AMARYL) 4 MG tablet TAKE 1 TABLET BY MOUTH DAILY BEFORE BREAKFAST.   [DISCONTINUED] levocetirizine (XYZAL) 5 MG tablet TAKE 1 TABLET BY MOUTH EVERY DAY IN THE EVENING   [DISCONTINUED] losartan (COZAAR) 50 MG tablet Take 1 tablet (50 mg total) by mouth daily.   [  DISCONTINUED] simvastatin (ZOCOR) 10 MG tablet TAKE 1 TABLET BY MOUTH EVERYDAY AT BEDTIME       04/28/2022    9:39 AM 02/21/2022   10:37 AM 01/07/2022    9:55 AM 11/01/2021   11:30 AM  GAD 7 : Generalized Anxiety Score  Nervous, Anxious, on Edge 0 0 0 0  Control/stop worrying 0 0 0 1  Worry too much - different things 1 0 1 1  Trouble relaxing 0 0 0 1  Restless 0 0 0 1  Easily annoyed or irritable 0 0 0 0  Afraid - awful might happen 0 0 0 0  Total GAD 7 Score 1 0 1 4  Anxiety Difficulty Not difficult at all Not difficult at all Not difficult at all Not difficult at all       04/28/2022    9:39 AM 02/21/2022   10:37 AM 01/07/2022    9:55 AM  Depression screen PHQ  2/9  Decreased Interest 0 0 0  Down, Depressed, Hopeless 0 0 0  PHQ - 2 Score 0 0 0  Altered sleeping _0 Tired, decreased energy 1 1 0  Change in appetite 0 0 1  Feeling bad or failure about yourself  0 0 0  Trouble concentrating 0 0   Moving slowly or fidgety/restless 0 0 0  Suicidal thoughts 0 0 0  PHQ-9 Score _1 Difficult doing work/chores Not difficult at all Not difficult at all Not difficult at all    BP Readings from Last 3 Encounters:  04/28/22 114/70  02/21/22 112/72  01/07/22 128/70    Physical Exam Vitals and nursing note reviewed.  Constitutional:      General: She is not in acute distress.    Appearance: She is well-developed.  HENT:     Head: Normocephalic and atraumatic.     Right Ear: Tympanic membrane and ear canal normal.     Left Ear: Tympanic membrane and ear canal normal.     Nose:     Right Sinus: No maxillary sinus tenderness.     Left Sinus: No maxillary sinus tenderness.  Eyes:     General: No scleral icterus.       Right eye: No discharge.        Left eye: No discharge.     Conjunctiva/sclera: Conjunctivae normal.  Neck:     Thyroid: No thyromegaly.     Vascular: No carotid bruit.  Cardiovascular:     Rate and Rhythm: Normal rate and regular rhythm.     Pulses: Normal pulses.     Heart sounds: Normal heart sounds.  Pulmonary:     Effort: Pulmonary effort is normal. No respiratory distress.     Breath sounds: No wheezing.  Chest:  Breasts:    Right: No mass, nipple discharge, skin change or tenderness.     Left: No mass, nipple discharge, skin change or tenderness.  Abdominal:     General: Bowel sounds are normal.     Palpations: Abdomen is soft.     Tenderness: There is no abdominal tenderness.  Musculoskeletal:     Cervical back: Normal range of motion. No erythema.     Right lower leg: No edema.     Left lower leg: No edema.  Lymphadenopathy:     Cervical: No cervical adenopathy.  Skin:    General: Skin is warm and  dry.     Findings: No rash.  Neurological:     Mental  Status: She is alert and oriented to person, place, and time.     Cranial Nerves: No cranial nerve deficit.     Sensory: No sensory deficit.     Deep Tendon Reflexes: Reflexes are normal and symmetric.  Psychiatric:        Attention and Perception: Attention normal.        Mood and Affect: Mood normal.     Wt Readings from Last 3 Encounters:  04/28/22 169 lb (76.7 kg)  02/21/22 166 lb 6.4 oz (75.5 kg)  01/07/22 163 lb (73.9 kg)    BP 114/70   Pulse 78   Ht _0  (1.575 m)   Wt 169 lb (76.7 kg)   SpO2 99%   BMI 30.91 kg/m   Assessment and Plan: 1. Annual physical exam Normal exam Continue healthy diet Up to date on screenings and immunizations.  2. Encounter for screening mammogram for breast cancer Schedule at [facility] then get Covid vaccine  3. Essential (primary) hypertension Clinically stable exam with well controlled BP. Tolerating medications without side effects at this time. Pt to continue current regimen and low sodium diet; benefits of regular exercise as able discussed. - CBC with Differential/Platelet - POCT urinalysis dipstick - TSH - amLODipine (NORVASC) 2.5 MG tablet; Take 1 tablet (2.5 mg total) by mouth daily.  Dispense: 90 tablet; Refill: 3 - losartan (COZAAR) 50 MG tablet; Take 1 tablet (50 mg total) by mouth daily.  Dispense: 90 tablet; Refill: 3  4. DM (diabetes mellitus), type 2 with complications ([place]) Clinically stable by exam and report without s/s of hypoglycemia. DM complicated by hypertension and dyslipidemia. Tolerating medications well without side effects or other concerns. - Comprehensive metabolic panel - Hemoglobin A1c - glimepiride (AMARYL) 4 MG tablet; Take 1 tablet (4 mg total) by mouth daily before breakfast.  Dispense: 90 tablet; Refill: 3  5. Hyperlipidemia associated with type 2 diabetes mellitus ([place]) Tolerating statin medication without side effects at this time LDL is at  goal of < 70 on current dose Continue same therapy without change at this time. - Lipid panel - simvastatin (ZOCOR) 10 MG tablet; TAKE 1 TABLET BY MOUTH EVERYDAY AT BEDTIME  Dispense: 90 tablet; Refill: 3  6. Seasonal allergic rhinitis due to pollen - levocetirizine (XYZAL) 5 MG tablet; Take 1 tablet (5 mg total) by mouth every evening.  Dispense: 90 tablet; Refill: 3   Partially dictated using Editor, commissioning. Any errors are unintentional.  [name], MD [facility]  04/28/2022

## 2022-04-28 NOTE — Patient Instructions (Signed)
Call [facility] to schedule your Mammogram at [phone].

## 2022-04-29 ENCOUNTER — Other Ambulatory Visit: Payer: Self-pay | Admitting: Internal Medicine

## 2022-04-29 ENCOUNTER — Ambulatory Visit: Payer: Self-pay

## 2022-04-29 DIAGNOSIS — E876 Hypokalemia: Secondary | ICD-10-CM | POA: Insufficient documentation

## 2022-04-29 LAB — CBC WITH DIFFERENTIAL/PLATELET
Basophils Absolute: 0 10*3/uL (ref 0.0–0.2)
Basos: 1 %
EOS (ABSOLUTE): 0.1 10*3/uL (ref 0.0–0.4)
Eos: 1 %
Hematocrit: 39.4 % (ref 34.0–46.6)
Hemoglobin: 13.3 g/dL (ref 11.1–15.9)
Immature Grans (Abs): 0 10*3/uL (ref 0.0–0.1)
Immature Granulocytes: 0 %
Lymphocytes Absolute: 1.3 10*3/uL (ref 0.7–3.1)
Lymphs: 30 %
MCH: 28.5 pg (ref 26.6–33.0)
MCHC: 33.8 g/dL (ref 31.5–35.7)
MCV: 85 fL (ref 79–97)
Monocytes Absolute: 0.4 10*3/uL (ref 0.1–0.9)
Monocytes: 10 %
Neutrophils Absolute: 2.4 10*3/uL (ref 1.4–7.0)
Neutrophils: 58 %
Platelets: 214 10*3/uL (ref 150–450)
RBC: 4.66 x10E6/uL (ref 3.77–5.28)
RDW: 12.6 % (ref 11.7–15.4)
WBC: 4.2 10*3/uL (ref 3.4–10.8)

## 2022-04-29 LAB — HEMOGLOBIN A1C
Est. average glucose Bld gHb Est-mCnc: 151 mg/dL
Hgb A1c MFr Bld: 6.9 % — ABNORMAL HIGH (ref 4.8–5.6)

## 2022-04-29 LAB — COMPREHENSIVE METABOLIC PANEL
ALT: 17 IU/L (ref 0–32)
AST: 24 IU/L (ref 0–40)
Albumin/Globulin Ratio: 1.6 (ref 1.2–2.2)
Albumin: 4.4 g/dL (ref 3.8–4.8)
Alkaline Phosphatase: 74 IU/L (ref 44–121)
BUN/Creatinine Ratio: 20 (ref 12–28)
BUN: 13 mg/dL (ref 8–27)
Bilirubin Total: 0.6 mg/dL (ref 0.0–1.2)
CO2: 25 mmol/L (ref 20–29)
Calcium: 9.7 mg/dL (ref 8.7–10.3)
Chloride: 102 mmol/L (ref 96–106)
Creatinine, Ser: 0.64 mg/dL (ref 0.57–1.00)
Globulin, Total: 2.8 g/dL (ref 1.5–4.5)
Glucose: 116 mg/dL — ABNORMAL HIGH (ref 70–99)
Potassium: 3.4 mmol/L — ABNORMAL LOW (ref 3.5–5.2)
Sodium: 141 mmol/L (ref 134–144)
Total Protein: 7.2 g/dL (ref 6.0–8.5)
eGFR: 94 mL/min/{1.73_m2} (ref >59)

## 2022-04-29 LAB — LIPID PANEL
Chol/HDL Ratio: 2.2 ratio (ref 0.0–4.4)
Cholesterol, Total: 114 mg/dL (ref 100–199)
HDL: 53 mg/dL (ref >39)
LDL Chol Calc (NIH): 50 mg/dL (ref 0–99)
Triglycerides: 43 mg/dL (ref 0–149)
VLDL Cholesterol Cal: 11 mg/dL (ref 5–40)

## 2022-04-29 LAB — TSH: TSH: 1.18 u[IU]/mL (ref 0.450–4.500)

## 2022-04-29 MED ORDER — POTASSIUM CHLORIDE CRYS ER 20 MEQ PO TBCR: 20.0000 meq | EXTENDED_RELEASE_TABLET | Freq: Every day | ORAL | 1 refills | Status: DC

## 2022-04-29 NOTE — Telephone Encounter (Signed)
Message from Bayard Beaver sent at 04/29/2022  1:01 PM EST  Summary: med ?   Patient called in about potassium chloride SA (KLOR-CON M) 20 MEQ tablet, just prescribed by Dr [name]. She states she wasn't aware she was getting this and want call back about it.         Pt given lab results per notes of Dr. [name] on 04/29/22. Pt verbalized understanding.

## 2022-04-29 NOTE — Telephone Encounter (Signed)
Patient called, left VM to return the call to the office to discuss medication with a nurse.  Summary: med ?   Patient called in about potassium chloride SA (KLOR-CON M) 20 MEQ tablet, just prescribed by Dr [name]. She states she wasn't aware she was getting this and want call back about it.

## 2022-04-30 ENCOUNTER — Ambulatory Visit: Payer: Self-pay | Admitting: *Deleted

## 2022-04-30 NOTE — Telephone Encounter (Signed)
Second attempt to reach pt, left VM to call back. 

## 2022-04-30 NOTE — Telephone Encounter (Signed)
Third attempt to reach pt, routing to practice for PCPs review and resolution per protocol.

## 2022-04-30 NOTE — Telephone Encounter (Signed)
   Pt was advised by the pharmacist that she is already taking Losartan Potassium and was prescribed potassium chloride SA (KLOR-CON M) 20 MEQ tablet/ they advised pt to check on this so she is not taking too much potassium / please advise if she needs to stop taking Losartan

## 2022-05-02 ENCOUNTER — Other Ambulatory Visit: Payer: Self-pay

## 2022-05-02 ENCOUNTER — Telehealth: Payer: Self-pay | Admitting: *Deleted

## 2022-05-02 ENCOUNTER — Telehealth: Payer: Self-pay

## 2022-05-02 ENCOUNTER — Ambulatory Visit (INDEPENDENT_AMBULATORY_CARE_PROVIDER_SITE_OTHER): Payer: Medicare HMO

## 2022-05-02 DIAGNOSIS — Z0000 Encounter for general adult medical examination without abnormal findings: Secondary | ICD-10-CM

## 2022-05-02 DIAGNOSIS — E876 Hypokalemia: Secondary | ICD-10-CM

## 2022-05-02 DIAGNOSIS — Z599 Problem related to housing and economic circumstances, unspecified: Secondary | ICD-10-CM | POA: Diagnosis not present

## 2022-05-02 NOTE — Patient Instructions (Signed)

## 2022-05-02 NOTE — Telephone Encounter (Signed)
Pt informed and verbalized understanding.  KP 

## 2022-05-02 NOTE — Progress Notes (Signed)
I connected with  [name] on 05/02/22 by a audio enabled telemedicine application and verified that I am speaking with the correct person using two identifiers.  Patient Location: Home  Provider Location: Office/Clinic  I discussed the limitations of evaluation and management by telemedicine. The patient expressed understanding and agreed to proceed.   Subjective:   Kathy Dougherty is a 71 y.o. female who presents for Medicare Annual (Subsequent) preventive examination.  Review of Systems    Per HPI unless specifically indicated below.  Cardiac Risk Factors include: advanced age (>98mn, >>28women);female gender, essential hypertension, diabetes mellitus, and hyperlipidemia.           Objective:    Today's Vitals   05/02/22 0948  PainSc: 0-No pain   There is no height or weight on file to calculate BMI.     05/02/2022   10:13 AM 06/28/2021    9:15 AM 05/01/2021    9:30 AM 05/28/2020    1:45 PM 04/30/2020    9:04 AM 04/25/2019    9:05 AM 04/19/2018    9:13 AM  Advanced Directives  Does Patient Have a Medical Advance Directive? No No No Yes No No No  Type of Advance Directive    Living will     Does patient want to make changes to medical advance directive?    No - Patient declined     Would patient like information on creating a medical advance directive? No - Patient declined No - Patient declined Yes (MAU/Ambulatory/Procedural Areas - Information given) No - Patient declined No - Patient declined Yes (MAU/Ambulatory/Procedural Areas - Information given) No - Patient declined    Current Medications (verified) Outpatient Encounter Medications as of 05/02/2022  Medication Sig   acetaminophen (TYLENOL) 500 MG tablet Take 500 mg by mouth every 6 (six) hours as needed.   amLODipine (NORVASC) 2.5 MG tablet Take 1 tablet (2.5 mg total) by mouth daily.   Azelastine HCl 137 MCG/SPRAY SOLN Place into both nostrils.   blood glucose meter kit and supplies KIT Dispense based on  patient and insurance preference. Test BS bid   dapagliflozin propanediol (FARXIGA) 5 MG TABS tablet Take 1 tablet (5 mg total) by mouth daily before breakfast.   docusate sodium (COLACE) 100 MG capsule Take 100 mg by mouth 2 (two) times daily as needed.   glimepiride (AMARYL) 4 MG tablet Take 1 tablet (4 mg total) by mouth daily before breakfast.   hydrochlorothiazide (HYDRODIURIL) 25 MG tablet Take 1 tablet (25 mg total) by mouth daily.   ibuprofen (ADVIL,MOTRIN) 600 MG tablet TAKE 1 TABLET (600 MG TOTAL) BY MOUTH EVERY 8 (EIGHT) HOURS AS NEEDED.   levocetirizine (XYZAL) 5 MG tablet Take 1 tablet (5 mg total) by mouth every evening.   mometasone (NASONEX) 50 MCG/ACT nasal spray Place 2 sprays into the nose daily.   omeprazole (PRILOSEC) 40 MG capsule TAKE 1 CAPSULE BY MOUTH EVERY DAY   OneTouch Delica Lancets 302IMISC TEST TWICE A DAY   ONETOUCH ULTRA test strip TEST TWICE DAILY   potassium chloride SA (KLOR-CON M) 20 MEQ tablet Take 1 tablet (20 mEq total) by mouth daily.   simvastatin (ZOCOR) 10 MG tablet TAKE 1 TABLET BY MOUTH EVERYDAY AT BEDTIME   losartan (COZAAR) 50 MG tablet Take 1 tablet (50 mg total) by mouth daily. (Patient not taking: Reported on 05/02/2022)   No facility-administered encounter medications on file as of 05/02/2022.    Allergies (verified) Ace inhibitors, Metformin and related, Penicillins,  and Sulfa antibiotics   History: Past Medical History:  Diagnosis Date   Diabetes mellitus without complication (HCC)    GERD (gastroesophageal reflux disease)    Hyperlipidemia    Hypertension    Seasonal allergies    Past Surgical History:  Procedure Laterality Date   CATARACT EXTRACTION Right 08/2014   CATARACT EXTRACTION Left 04/2016   HAMMER TOE SURGERY     PARTIAL HYSTERECTOMY     fibroids   Family History  Problem Relation Age of Onset   Diabetes Mother    Cancer Brother    Diabetes Sister    Cancer Brother    Heart disease Brother    Sudden death  Brother    Heart disease Brother    Diabetes Brother    Diabetes Brother    Social History   Socioeconomic History   Marital status: Divorced    Spouse name: Not on file   Number of children: 0   Years of education: Not on file   Highest education level: Some college, no degree  Occupational History   Occupation: retired  Tobacco Use   Smoking status: Never   Smokeless tobacco: Never  Vaping Use   Vaping Use: Never used  Substance and Sexual Activity   Alcohol use: No    Alcohol/week: 0.0 standard drinks of alcohol   Drug use: No   Sexual activity: Not Currently  Other Topics Concern   Not on file  Social History Narrative   Pt lives alone.    Social Determinants of Health   Financial Resource Strain: Medium Risk (05/02/2022)   Overall Financial Resource Strain (CARDIA)    Difficulty of Paying Living Expenses: Somewhat hard  Food Insecurity: Food Insecurity Present (05/02/2022)   Hunger Vital Sign    Worried About Running Out of Food in the Last Year: Often true    Ran Out of Food in the Last Year: Often true  Transportation Needs: No Transportation Needs (05/02/2022)   PRAPARE - Hydrologist (Medical): No    Lack of Transportation (Non-Medical): No  Physical Activity: Inactive (05/02/2022)   Exercise Vital Sign    Days of Exercise per Week: 0 days    Minutes of Exercise per Session: 0 min  Stress: No Stress Concern Present (05/02/2022)   [place]    Feeling of Stress : Not at all  Social Connections: Socially Isolated (05/02/2022)   Social Connection and Isolation Panel [NHANES]    Frequency of Communication with Friends and Family: More than three times a week    Frequency of Social Gatherings with Friends and Family: Twice a week    Attends Religious Services: Never    Marine scientist or Organizations: No    Attends Music therapist: Never     Marital Status: Divorced    Tobacco Counseling Counseling given: Not Answered   Clinical Intake:  Pre-visit preparation completed: No  Pain : No/denies pain Pain Score: 0-No pain     Nutritional Risks: None Diabetes: No  How often do you need to have someone help you when you read instructions, pamphlets, or other written materials from your doctor or pharmacy?: 1 - Never  Diabetic?Nutrition Risk Assessment:  Has the patient had any N/V/D within the last 2 months?  No  Does the patient have any non-healing wounds?  No  Has the patient had any unintentional weight loss or weight gain?  No  Diabetes:  Is the patient diabetic?  Yes  If diabetic, was a CBG obtained today?  Yes  Did the patient bring in their glucometer from home?  No  How often do you monitor your CBG's? Once daily .   Financial Strains and Diabetes Management:  Are you having any financial strains with the device, your supplies or your medication? No .  Does the patient want to be seen by Chronic Care Management for management of their diabetes?  No  Would the patient like to be referred to a Nutritionist or for Diabetic Management?  No   Diabetic Exams:  Diabetic Eye Exam: Completed 02/18/2022 Diabetic Foot Exam: Completed 01/07/2022       Information entered by :: Donnie [place], [place]   Activities of Daily Living    05/02/2022    9:46 AM 09/06/2021    2:41 PM  In your present state of health, do you have any difficulty performing the following activities:  Hearing? 1 1  Comment Rt ear hearing loss   Vision? 0 0  Comment Dr. Lucia Gaskins   Difficulty concentrating or making decisions? 0 0  Walking or climbing stairs? 0 0  Dressing or bathing? 0 0  Doing errands, shopping? 0 0    Patient Care Team: [name], MD as PCP - General (Internal Medicine) Griffin Dakin (Optometry) [place], Romilda Garret, DPM as Consulting Physician (Podiatry)  Indicate any recent Medical Services you may have  received from other than [facility] providers in the past year (date may be approximate). The pt was seen at [facility] on 06/28/2021 for neck muscle spasm.    Assessment:   This is a routine wellness examination for Johanne.  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 Denies any changes in her vision. Annual Eye Exam, Dr. Lucia Gaskins  Dietary issues and exercise activities discussed: Current Exercise Habits: The patient does not participate in regular exercise at present, Exercise limited by: None identified   Goals Addressed   None    Depression Screen    05/02/2022    9:45 AM 04/28/2022    9:39 AM 02/21/2022   10:37 AM 01/07/2022    9:55 AM 11/01/2021   11:29 AM 09/06/2021    2:40 PM 08/26/2021    8:30 AM  PHQ 2/9 Scores  PHQ - 2 Score 0 0 0 0 0 0 0  PHQ- 9 Score  _0 0    Fall Risk    05/02/2022    9:46 AM 04/28/2022    9:39 AM 02/21/2022   10:37 AM 01/07/2022    9:56 AM 11/01/2021   11:30 AM  Fall Risk   Falls in the past year? 0 0 0 0 0  Number falls in past yr: 0 0 0 0 0  Injury with Fall? 0 0 0 0 0  Risk for fall due to : _1   Follow up _2     FALL RISK PREVENTION PERTAINING TO THE HOME:  Any stairs in or around the home? Yes  If so, are there any without handrails? No  Home free of loose throw rugs in walkways, pet beds, electrical cords, etc? Yes  Adequate lighting in your home to reduce risk of falls? Yes   ASSISTIVE DEVICES UTILIZED TO PREVENT FALLS:  Life alert? No  Use of a cane, walker or w/c? No  Grab bars in the bathroom? Yes  Shower chair or bench in shower? No  Elevated toilet seat or a handicapped toilet? Yes  TIMED UP AND GO:  Was the test performed?  no, virtual appt .  Cognitive Function:        05/02/2022     9:47 AM 04/30/2020    9:05 AM 04/25/2019    9:12 AM 03/23/2017    9:19 AM  6CIT Screen  What Year? 0 points 0 points 0 points 0 points  What month? 0 points 0 points 0 points 0 points  What time? 0 points 0 points 0 points 0 points  Count back from 20 0 points 0 points 0 points 0 points  Months in reverse 0 points 0 points 0 points 0 points  Repeat phrase 0 points 0 points 0 points 2 points  Total Score 0 points 0 points 0 points 2 points    Immunizations Immunization History  Administered Date(s) Administered   Fluad Quad(high Dose 65+) 02/18/2019, 03/13/2020, 01/28/2021, 03/12/2022   Influenza, High Dose Seasonal PF 03/23/2017, 03/22/2018   Influenza,inj,Quad PF,6+ Mos 03/19/2015, 03/20/2016   Moderna Covid-19 Vaccine Bivalent Booster 23yr & up 03/01/2021   Moderna Sars-Covid-2 Vaccination 06/24/2019, 07/26/2019, 03/19/2020   Pneumococcal Conjugate-13 03/20/2016   Pneumococcal Polysaccharide-23 03/23/2017   Zoster, Live 11/05/2012    TDAP status: Due, Education has been provided regarding the importance of this vaccine. Advised may receive this vaccine at local pharmacy or Health Dept. Aware to provide a copy of the vaccination record if obtained from local pharmacy or Health Dept. Verbalized acceptance and understanding.  Flu Vaccine status: Up to date  Pneumococcal vaccine status: Up to date  Covid-19 vaccine status: Information provided on how to obtain vaccines.   Qualifies for Shingles Vaccine? Yes   Zostavax completed No   Shingrix Completed?: No.    Education has been provided regarding the importance of this vaccine. Patient has been advised to call insurance company to determine out of pocket expense if they have not yet received this vaccine. Advised may also receive vaccine at local pharmacy or Health Dept. Verbalized acceptance and understanding.  Screening Tests Health Maintenance  Topic Date Due   DTaP/Tdap/Td (1 - Tdap) Never done   Zoster Vaccines- Shingrix  (1 of 2) Never done   COVID-19 Vaccine (5 - 2023-24 season) 01/17/2022   MAMMOGRAM  05/08/2022   COLONOSCOPY (Pts 45-468yrInsurance coverage will need to be confirmed)  05/20/2022   Diabetic kidney evaluation - Urine ACR  08/27/2022   HEMOGLOBIN A1C  10/28/2022   FOOT EXAM  01/08/2023   OPHTHALMOLOGY EXAM  02/19/2023   Diabetic kidney evaluation - eGFR measurement  04/29/2023   Medicare Annual Wellness (AWV)  05/03/2023   Pneumonia Vaccine 6521Years old  Completed   INFLUENZA VACCINE  Completed   DEXA SCAN  Completed   Hepatitis C Screening  Addressed   HPV VACCINES  Aged Out    Health Maintenance  Health Maintenance Due  Topic Date Due   DTaP/Tdap/Td (1 - Tdap) Never done   Zoster Vaccines- Shingrix (1 of 2) Never done   COVID-19 Vaccine (5 - 2023-24 season) 01/17/2022    Colorectal cancer screening: Type of screening: Colonoscopy. Completed 05/20/2012. Repeat every 10 years  Mammogram status: Completed 05/08/2021. Repeat every year  DEXA Scan: 08/18/2017  Lung Cancer Screening: (Low Dose CT Chest recommended if Age 71-80ears, 30 pack-year currently smoking OR have quit  w/in 15years.) does not qualify.   Lung Cancer Screening Referral: not applicable   Additional Screening:  Hepatitis C Screening: does qualify; Completed 12/01/2012  Vision Screening: Recommended annual ophthalmology exams for early detection of glaucoma and other disorders of the eye. Is the patient up to date with their annual eye exam?  Yes  Who is the provider or what is the name of the office in which the patient attends annual eye exams? 02/18/2022, Dr. Lucia Gaskins  If pt is not established with a provider, would they like to be referred to a provider to establish care? No .   Dental Screening: Recommended annual dental exams for proper oral hygiene  Community Resource Referral / Chronic Care Management: CRR required this visit?  Yes  CCM required this visit?  Yes     Plan:     I have  personally reviewed and noted the following in the patient's chart:   Medical and social history Use of alcohol, tobacco or illicit drugs  Current medications and supplements including opioid prescriptions. Patient is not currently taking opioid prescriptions. Functional ability and status Nutritional status Physical activity Advanced directives List of other physicians Hospitalizations, surgeries, and ER visits in previous 12 months Vitals Screenings to include cognitive, depression, and falls Referrals and appointments  In addition, I have reviewed and discussed with patient certain preventive protocols, quality metrics, and best practice recommendations. A written personalized care plan for preventive services as well as general preventive health recommendations were provided to patient.    Ms. Sadek , Thank you for taking time to come for your Medicare Wellness Visit. I appreciate your ongoing commitment to your health goals. Please review the following plan we discussed and let me know if I can assist you in the future.   These are the goals we discussed:  Goals      Increase physical activity     Join Silver Sneakers and increase physical activity to 150 minutes per week.      Pharmacy care plan     CARE PLAN ENTRY (see longitudinal plan of care for additional care plan information)  Current Barriers:  Chronic Disease Management support, education, and care coordination needs related to Hypertension, Hyperlipidemia, Diabetes, GERD, Osteoarthritis, and Allergic Rhinitis   Hypertension BP Readings from Last 3 Encounters:  04/30/20 140/80  04/18/20 123/68  01/10/20 124/86  Pharmacist Clinical Goal(s): Over the next 60 days, patient will work with PharmD and providers to maintain BP goal <130/80 Current regimen:  Amlodipine 2.5 mg qd HCTZ 25 mg qd Losartan 50 mg qd Interventions:  Counseled on proper BP technique   Disucussed changing administration times of BP meds  to HS Provided diet and exercise counseling. Patient self care activities - Over the next 60 days, patient will: Check BP daily document, and provide at future appointments Ensure daily salt intake < 2300 mg/day  Hyperlipidemia Lab Results  Component Value Date/Time   [facility] 04/18/2020 09:31 AM  Pharmacist Clinical Goal(s): Over the next 60 days, patient will work with PharmD and providers to maintain LDL goal < 70 Current regimen:  Simvastatin 10 mg qd Interventions: Provided diet and exercise counseling. Patient self care activities - Over the next 60 days, patient will: Increase exercise to goal of 30 min/cay 5 days/week   Diabetes Lab Results  Component Value Date/Time   HGBA1C 7.9 (H) 04/18/2020 09:31 AM   HGBA1C 8.7 (H) 12/08/2019 08:55 AM  Pharmacist Clinical Goal(s): Over the next 60 days, patient will work  with PharmD and providers to achieve A1c goal <7% Current regimen:  Glimepiride 4 mg ac breakfast Interventions:  Reviewed goal BG readings for an A1c of <7%, we want to see fasting sugars <130 and 2 hour after meal sugars <180.  Provided diet and exercise counseling. Discussed medicare requirements for CGM coverage and out of pocket cost. Patient is willing to pay for device and supplies. Patient self care activities - Over the next 60 days, patient will: Check blood sugar twice daily, document, and provide at future appointments Contact provider with any episodes of hypoglycemia Continue healthy diet and increase exercise to 30 min/ 5 days per week.  Medication management Pharmacist Clinical Goal(s): Over the next 60 days, patient will work with PharmD and providers to achieve optimal medication adherence Current pharmacy: [facility] Interventions Comprehensive medication review performed. Continue current medication management strategy Patient self care activities - Over the next 60 days, patient will: Focus on medication adherence by fill dates Take  medications as prescribed Report any questions or concerns to PharmD and/or provider(s)  Please see past updates related to this goal by clicking on the "Past Updates" button in the selected goal       PharmD - Diabetes Education     Current Barriers:  Chronic Disease Management support, education, and care coordination needs related to HTN, HLD, and DMII  Pharmacist Clinical Goal(s):  Over the next 1 days, patient will work with CM Pharmacist to address needs related to diabetes management education  Interventions: Address patient's questions related to the Dexcom Continuous Glucose Monitoring (CGM) System eligibility requirements through her health plan coverage.  Based on information from patient and chart review, patient does not currently meet Medicare Coverage Criteria for this type of device Provide patient with education on basics of type 2 diabetes mellitus diease state as well as diabetes medication management Counsel patient on importance of blood sugar control Reports currently taking glimepiride 2 mg once daily before breakfast Reports morning fasting CBGs ranging 120-140s Denies any recent low blood sugars.  Counsel on role of nutrition and exercise in blood sugar control Encourage patient in goal of walking 30 minutes/day x 5 days/week Counsel on importance of regular well-balance meals and control of carbohydrate portion size. Denies further medication questions/concerns. Patient declines offer for future outreach from [facility] Pharmacist.  Patient Self Care Activities:  Calls provider office for new concerns or questions  Initial goal documentation      Prevent Falls     Recommended to remove items from the home that may cause slips or trips.        This is a list of the screening recommended for you and due dates:  Health Maintenance  Topic Date Due   DTaP/Tdap/Td vaccine (1 - Tdap) Never done   Zoster (Shingles) Vaccine (1 of 2) Never done   COVID-19 Vaccine  (5 - 2023-24 season) 01/17/2022   Mammogram  05/08/2022   Colon Cancer Screening  05/20/2022   Yearly kidney health urinalysis for diabetes  08/27/2022   Hemoglobin A1C  10/28/2022   Complete foot exam   01/08/2023   Eye exam for diabetics  02/19/2023   Yearly kidney function blood test for diabetes  04/29/2023   Medicare Annual Wellness Visit  05/03/2023   Pneumonia Vaccine  Completed   Flu Shot  Completed   DEXA scan (bone density measurement)  Completed   Hepatitis C Screening: USPSTF Recommendation to screen - Ages 39-79 yo.  Addressed   HPV Vaccine  Aged  [place], [place]   05/02/2022   Nurse Notes: Approximately 30 minute Non-Face -To-Face Medicare Wellness Visit

## 2022-05-02 NOTE — Telephone Encounter (Signed)
The patient had her AWV appointment today. She informed me that you started her on Potassium 20 MEQ on 04/29/2022. She received a phone call from her pharmacist that she needed to follow up with her PCP because she is already on Losartan to confirm that she is not getting too much Potassium. She has since stopped the Losartan.

## 2022-05-02 NOTE — Progress Notes (Signed)
  Care Coordination  Outreach Note  05/02/2022 Name: Kathy Dougherty MRN: [id number] DOB: 02-02-1951   Care Coordination Outreach Attempts: An unsuccessful telephone outreach was attempted today to offer the patient information about available care coordination services as a benefit of their health plan.   Referral received   Follow Up Plan:  Additional outreach attempts will be made to offer the patient care coordination information and services.   Encounter Outcome:  No Answer  Julian Hy, [place] Direct Dial: [phone]

## 2022-05-05 ENCOUNTER — Ambulatory Visit: Payer: Self-pay | Admitting: *Deleted

## 2022-05-05 ENCOUNTER — Ambulatory Visit: Payer: Medicare HMO

## 2022-05-05 DIAGNOSIS — Z1231 Encounter for screening mammogram for malignant neoplasm of breast: Secondary | ICD-10-CM | POA: Diagnosis not present

## 2022-05-05 NOTE — Patient Instructions (Signed)
Visit Information  Thank you for taking time to visit with me today. Please don't hesitate to contact me if I can be of assistance to you.   Following are the goals we discussed today:   Goals Addressed             This Visit's Progress    I need resources for utilities and food       Care Coordination Interventions: Consult received to assist patient with resources to assist with utilities and food  Patient confirms that she receives food stamps, however due to food costs- food stamps does not cover all of her food needs Patient further confirms that she has been in contact with the Department of Social Services and has a voucher that will take affect in January for assistance with gas bill It was suggested that patient also contact [employer] for additional food bank options and financial assistance Local food banks list to also be mailed to patient's home Depression screen reviewed  Solution-Focused Strategies employed:           Please call the care guide team at [phone] if you need to cancel or reschedule your appointment.   If you are experiencing a Mental Health or [place] or need someone to talk to, please call 911   Patient verbalizes understanding of instructions and care plan provided today and agrees to view in [place]. Active MyChart status and patient understanding of how to access instructions and care plan via MyChart confirmed with patient.     No further follow up required: food bank resources mailed to patients home  [place], [place] Worker  [facility] [phone]

## 2022-05-05 NOTE — Patient Outreach (Signed)
  Care Coordination   Initial Visit Note   05/05/2022 Name: Kathy Dougherty MRN: [id number] DOB: 1950/07/08  Kathy Dougherty is a 71 y.o. year old female who sees [name], MD for primary care. I spoke with  Kathy Dougherty by phone today.  What matters to the patients health and wellness today?  Resources for utilities and food    Goals Addressed             This Visit's Progress    I need resources for utilities and food       Care Coordination Interventions: Consult received to assist patient with resources to assist with utilities and food  Patient confirms that she receives food stamps, however due to food costs- food stamps does not cover all of her food needs Patient further confirms that she has been in contact with the Department of Social Services and has a voucher that will take affect in January for assistance with gas bill It was suggested that patient also contact [employer] for additional food bank options and financial assistance Local food banks list to also be mailed to patient's home Depression screen reviewed  Solution-Focused Strategies employed:          SDOH assessments and interventions completed:  Yes  SDOH Interventions Today    Flowsheet Row Most Recent Value  SDOH Interventions   Food Insecurity Interventions Other (Comment)  [patient receives food stamps 120.00 per month, [employer] [address] provided as well a listing of food area food banks]  Transportation Interventions Intervention Not Indicated  Utilities Interventions --  [patient has contacted DSS and has a voucher that will take affect in [place] Coordination Interventions:  Yes, provided   Follow up plan: No further intervention required.   Encounter Outcome:  Pt. Visit Completed

## 2022-06-02 ENCOUNTER — Other Ambulatory Visit: Payer: Self-pay | Admitting: Internal Medicine

## 2022-06-02 DIAGNOSIS — E876 Hypokalemia: Secondary | ICD-10-CM | POA: Diagnosis not present

## 2022-06-03 LAB — BASIC METABOLIC PANEL
BUN/Creatinine Ratio: 20 (ref 12–28)
BUN: 14 mg/dL (ref 8–27)
CO2: 27 mmol/L (ref 20–29)
Calcium: 10.3 mg/dL (ref 8.7–10.3)
Chloride: 102 mmol/L (ref 96–106)
Creatinine, Ser: 0.69 mg/dL (ref 0.57–1.00)
Glucose: 64 mg/dL — ABNORMAL LOW (ref 70–99)
Potassium: 3.6 mmol/L (ref 3.5–5.2)
Sodium: 142 mmol/L (ref 134–144)
eGFR: 93 mL/min/{1.73_m2} (ref >59)

## 2022-06-11 NOTE — Progress Notes (Signed)
Pt had appt with LCSW on 05/06/2023

## 2022-07-11 ENCOUNTER — Telehealth: Payer: Self-pay | Admitting: Internal Medicine

## 2022-07-11 NOTE — Telephone Encounter (Signed)
Copied from [place] [id number]. Topic: General - Other >> Jul 11, 2022 12:57 PM [place] B wrote: Reason for CRM: Patient states is on automatic refill of Amlodipine and she received a tetx from the pharmacy for her to pick this up from the office. Does she need to pick this up or will it be delivered? Please call nback

## 2022-07-11 NOTE — Telephone Encounter (Signed)
Called pt let her know she would get her medication from [facility] in [place]. KP

## 2022-07-24 ENCOUNTER — Other Ambulatory Visit: Payer: Self-pay | Admitting: Internal Medicine

## 2022-07-24 DIAGNOSIS — I10 Essential (primary) hypertension: Secondary | ICD-10-CM

## 2022-08-28 ENCOUNTER — Encounter: Payer: Self-pay | Admitting: Internal Medicine

## 2022-08-28 ENCOUNTER — Ambulatory Visit (INDEPENDENT_AMBULATORY_CARE_PROVIDER_SITE_OTHER): Payer: Medicare HMO | Admitting: Internal Medicine

## 2022-08-28 VITALS — BP 122/74 | HR 98 | Ht 62.0 in | Wt 167.0 lb

## 2022-08-28 DIAGNOSIS — I700 Atherosclerosis of aorta: Secondary | ICD-10-CM

## 2022-08-28 DIAGNOSIS — I10 Essential (primary) hypertension: Secondary | ICD-10-CM

## 2022-08-28 DIAGNOSIS — E785 Hyperlipidemia, unspecified: Secondary | ICD-10-CM | POA: Diagnosis not present

## 2022-08-28 DIAGNOSIS — E1169 Type 2 diabetes mellitus with other specified complication: Secondary | ICD-10-CM | POA: Diagnosis not present

## 2022-08-28 DIAGNOSIS — E118 Type 2 diabetes mellitus with unspecified complications: Secondary | ICD-10-CM | POA: Diagnosis not present

## 2022-08-28 LAB — POCT GLYCOSYLATED HEMOGLOBIN (HGB A1C): Hemoglobin A1C: 6.8 % — AB (ref 4.0–5.6)

## 2022-08-28 NOTE — Patient Instructions (Signed)
For allergies - avoid medications that have "sinus" or "D" in the name.  These contain decongestants that can increase BP.

## 2022-08-28 NOTE — Assessment & Plan Note (Signed)
Clinically stable exam with well controlled BP on amlodipine, losartan and hctz. Tolerating medications without side effects. Pt to continue current regimen and low sodium diet.  

## 2022-08-28 NOTE — Progress Notes (Signed)
Date:  08/28/2022   Name:  Kathy Dougherty   DOB:  1950/05/26   MRN:  [id number]   Chief Complaint: Diabetes and Hypertension  Hypertension This is a chronic problem. The problem is controlled (has been high the past few weeks but she has been taking Allegra-D). Pertinent negatives include no chest pain, headaches, palpitations or shortness of breath. Past treatments include angiotensin blockers, diuretics and calcium channel blockers. The current treatment provides significant improvement.  Diabetes She presents for her follow-up diabetic visit. She has type 2 diabetes mellitus. Her disease course has been stable. Pertinent negatives for hypoglycemia include no headaches or tremors. Pertinent negatives for diabetes include no chest pain, no fatigue, no polydipsia and no polyuria. Symptoms are stable. Current diabetic treatments: farxiga and glimepiride.    Lab Results  Component Value Date   NA 142 06/02/2022   K 3.6 06/02/2022   CO2 27 06/02/2022   GLUCOSE 64 (L) 06/02/2022   BUN 14 06/02/2022   CREATININE 0.69 06/02/2022   CALCIUM 10.3 06/02/2022   EGFR 93 06/02/2022   GFRNONAA >60 01/07/2022   Lab Results  Component Value Date   CHOL 114 04/28/2022   HDL 53 04/28/2022   LDLCALC 50 04/28/2022   TRIG 43 04/28/2022   CHOLHDL 2.2 04/28/2022   Lab Results  Component Value Date   TSH 1.180 04/28/2022   Lab Results  Component Value Date   HGBA1C 6.8 (A) 08/28/2022   Lab Results  Component Value Date   WBC 4.2 04/28/2022   HGB 13.3 04/28/2022   HCT 39.4 04/28/2022   MCV 85 04/28/2022   PLT 214 04/28/2022   Lab Results  Component Value Date   ALT 17 04/28/2022   AST 24 04/28/2022   ALKPHOS 74 04/28/2022   BILITOT 0.6 04/28/2022   No results found for: "25OHVITD2", "25OHVITD3", "VD25OH"   Review of Systems  Constitutional:  Negative for appetite change, fatigue, fever and unexpected weight change.  HENT:  Negative for tinnitus and trouble swallowing.   Eyes:   Negative for visual disturbance.  Respiratory:  Negative for cough, chest tightness and shortness of breath.   Cardiovascular:  Negative for chest pain, palpitations and leg swelling.  Gastrointestinal:  Negative for abdominal pain.  Endocrine: Negative for polydipsia and polyuria.  Genitourinary:  Negative for dysuria and hematuria.  Musculoskeletal:  Negative for arthralgias.  Neurological:  Negative for tremors, numbness and headaches.  Psychiatric/Behavioral:  Negative for dysphoric mood.     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/02/2021   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   Lipoma of lateral chest wall 04/18/2020   Porokeratosis 08/05/2019   Hyperlipidemia associated with type 2 diabetes mellitus 03/26/2017   Iritis of left eye 03/26/2017   Pre-ulcerative calluses 03/26/2017   Arthritis of right knee 01/15/2016   DM (diabetes mellitus), type 2 with complications 03/15/2015   Abnormal LFTs 03/15/2015   Essential (primary) hypertension 03/15/2015   Gastro-esophageal reflux disease without esophagitis 03/15/2015   Allergic rhinitis, seasonal 03/15/2015    Allergies  Allergen Reactions   Ace Inhibitors Cough   Metformin And Related Diarrhea   Penicillins Rash   Sulfa Antibiotics Rash    Past Surgical History:  Procedure Laterality Date   CATARACT EXTRACTION Right 08/2014   CATARACT EXTRACTION Left 04/2016   HAMMER TOE SURGERY     PARTIAL HYSTERECTOMY     fibroids  Social History   Tobacco Use   Smoking status: Never   Smokeless tobacco: Never  Vaping Use   Vaping Use: Never used  Substance Use Topics   Alcohol use: No    Alcohol/week: 0.0 standard drinks of alcohol   Drug use: No     Medication list has been reviewed and updated.  Current Meds  Medication Sig   acetaminophen (TYLENOL) 500 MG tablet Take 500 mg by mouth every 6 (six) hours as needed.    amLODipine (NORVASC) 2.5 MG tablet Take 1 tablet (2.5 mg total) by mouth daily.   Azelastine HCl 137 MCG/SPRAY SOLN Place into both nostrils.   blood glucose meter kit and supplies KIT Dispense based on patient and insurance preference. Test BS bid   dapagliflozin propanediol (FARXIGA) 5 MG TABS tablet Take 1 tablet (5 mg total) by mouth daily before breakfast.   docusate sodium (COLACE) 100 MG capsule Take 100 mg by mouth 2 (two) times daily as needed.   glimepiride (AMARYL) 4 MG tablet Take 1 tablet (4 mg total) by mouth daily before breakfast.   hydrochlorothiazide (HYDRODIURIL) 25 MG tablet TAKE 1 TABLET (25 MG TOTAL) BY MOUTH DAILY.   ibuprofen (ADVIL,MOTRIN) 600 MG tablet TAKE 1 TABLET (600 MG TOTAL) BY MOUTH EVERY 8 (EIGHT) HOURS AS NEEDED.   levocetirizine (XYZAL) 5 MG tablet Take 1 tablet (5 mg total) by mouth every evening.   losartan (COZAAR) 50 MG tablet Take 1 tablet (50 mg total) by mouth daily.   mometasone (NASONEX) 50 MCG/ACT nasal spray Place 2 sprays into the nose daily.   omeprazole (PRILOSEC) 40 MG capsule TAKE 1 CAPSULE BY MOUTH EVERY DAY   OneTouch Delica Lancets 33G MISC TEST TWICE A DAY   ONETOUCH ULTRA test strip TEST TWICE DAILY   potassium chloride SA (KLOR-CON M) 20 MEQ tablet Take 1 tablet (20 mEq total) by mouth daily.   simvastatin (ZOCOR) 10 MG tablet TAKE 1 TABLET BY MOUTH EVERYDAY AT BEDTIME       08/28/2022    9:32 AM 04/28/2022    9:39 AM 02/21/2022   10:37 AM 01/07/2022    9:55 AM  GAD 7 : Generalized Anxiety Score  Nervous, Anxious, on Edge 0 0 0 0  Control/stop worrying 0 0 0 0  Worry too much - different things 0 1 0 1  Trouble relaxing 0 0 0 0  Restless 0 0 0 0  Easily annoyed or irritable 0 0 0 0  Afraid - awful might happen 0 0 0 0  Total GAD 7 Score 0 1 0 1  Anxiety Difficulty Not difficult at all Not difficult at all Not difficult at all Not difficult at all       08/28/2022    9:32 AM 05/02/2022    9:45 AM 04/28/2022    9:39 AM   Depression screen PHQ 2/9  Decreased Interest 0 0 0  Down, Depressed, Hopeless 0 0 0  PHQ - 2 Score 0 0 0  Altered sleeping 1  2  Tired, decreased energy 0  1  Change in appetite 1  0  Feeling bad or failure about yourself  0  0  Trouble concentrating 0  0  Moving slowly or fidgety/restless 1  0  Suicidal thoughts 0  0  PHQ-9 Score 3  3  Difficult doing work/chores Not difficult at all  Not difficult at all    BP Readings from Last 3 Encounters:  08/28/22 122/74  04/28/22 114/70  02/21/22 112/72    Physical Exam Vitals and nursing note reviewed.  Constitutional:      General: She is not in acute distress.    Appearance: She is well-developed.  HENT:     Head: Normocephalic and atraumatic.  Cardiovascular:     Rate and Rhythm: Normal rate and regular rhythm.     Pulses: Normal pulses.  Pulmonary:     Effort: Pulmonary effort is normal. No respiratory distress.     Breath sounds: No wheezing or rhonchi.  Musculoskeletal:     Cervical back: Normal range of motion.     Right lower leg: No edema.     Left lower leg: No edema.  Lymphadenopathy:     Cervical: No cervical adenopathy.  Skin:    General: Skin is warm and dry.     Findings: No rash.  Neurological:     General: No focal deficit present.     Mental Status: She is alert and oriented to person, place, and time.  Psychiatric:        Mood and Affect: Mood normal.        Behavior: Behavior normal.     Wt Readings from Last 3 Encounters:  08/28/22 167 lb (75.8 kg)  04/28/22 169 lb (76.7 kg)  02/21/22 166 lb 6.4 oz (75.5 kg)    BP 122/74 (BP Location: Right Arm, Cuff Size: Large)   Pulse 98   Ht 5\' 2"  (1.575 m)   Wt 167 lb (75.8 kg)   SpO2 98%   BMI 30.54 kg/m   Assessment and Plan:  Problem List Items Addressed This Visit       Cardiovascular and Mediastinum   Aortic arch atherosclerosis    On appropriate statin therapy      Essential (primary) hypertension (Chronic)    Clinically stable  exam with well controlled BP on amlodipine, losartan and hctz. Tolerating medications without side effects. Pt to continue current regimen and low sodium diet.         Endocrine   DM (diabetes mellitus), type 2 with complications - Primary (Chronic)    Clinically stable without s/s of hypoglycemia. Tolerating Farxiga and glimepiride well without side effects or other concerns. Lab Results  Component Value Date   HGBA1C 6.9 (H) 04/28/2022  A1C today = 6.8      Relevant Orders   POCT glycosylated hemoglobin (Hb A1C) (Completed)   Microalbumin / creatinine urine ratio   Hyperlipidemia associated with type 2 diabetes mellitus (Chronic)    Tolerating statin medications without concerns LDL is  Lab Results  Component Value Date   LDLCALC 50 04/28/2022  with a goal of < 70.         Return in about 4 months (around 12/28/2022) for DM, HTN.   Partially dictated using Dragon software, any errors are not intentional.  Reubin MilanLaura H. Tydarius Yawn, MD [facility] [place], [place]

## 2022-08-28 NOTE — Assessment & Plan Note (Addendum)
Clinically stable without s/s of hypoglycemia. Tolerating Farxiga and glimepiride well without side effects or other concerns. Lab Results  Component Value Date   HGBA1C 6.9 (H) 04/28/2022   A1C today = 6.8

## 2022-08-28 NOTE — Assessment & Plan Note (Signed)
On appropriate statin therapy 

## 2022-08-28 NOTE — Assessment & Plan Note (Signed)
Tolerating statin medications without concerns LDL is  Lab Results  Component Value Date   LDLCALC 50 04/28/2022   with a goal of < 70.

## 2022-08-29 LAB — MICROALBUMIN / CREATININE URINE RATIO
Creatinine, Urine: 20.9 mg/dL
Microalb/Creat Ratio: <14 mg/g creat (ref 0–29)
Microalbumin, Urine: <3 ug/mL

## 2022-08-29 LAB — SPECIMEN STATUS REPORT

## 2022-09-19 ENCOUNTER — Encounter: Payer: Self-pay | Admitting: Internal Medicine

## 2022-09-19 ENCOUNTER — Ambulatory Visit (INDEPENDENT_AMBULATORY_CARE_PROVIDER_SITE_OTHER): Payer: Medicare HMO | Admitting: Internal Medicine

## 2022-09-19 VITALS — BP 112/68 | HR 106 | Ht 62.0 in | Wt 165.0 lb

## 2022-09-19 DIAGNOSIS — M62838 Other muscle spasm: Secondary | ICD-10-CM

## 2022-09-19 MED ORDER — BACLOFEN 10 MG PO TABS
10.0000 mg | ORAL_TABLET | Freq: Three times a day (TID) | ORAL | 0 refills | Status: DC
Start: 2022-09-19 — End: 2022-12-31

## 2022-09-19 NOTE — Progress Notes (Signed)
Date:  09/19/2022   Name:  Kathy Dougherty   DOB:  1951-05-17   MRN:  [id number]   Chief Complaint: Neck Pain (Right neck pain. Hurts to turn neck and head. Started x 1 week ago.)  Neck Pain  This is a new problem. The current episode started in the past 7 days. The pain is associated with nothing. The pain is present in the midline. The quality of the pain is described as aching. The pain is moderate. The symptoms are aggravated by coughing and twisting. Pertinent negatives include no chest pain, fever or headaches. She has tried acetaminophen and heat for the symptoms. The treatment provided mild relief.    Lab Results  Component Value Date   NA 142 06/02/2022   K 3.6 06/02/2022   CO2 27 06/02/2022   GLUCOSE 64 (L) 06/02/2022   BUN 14 06/02/2022   CREATININE 0.69 06/02/2022   CALCIUM 10.3 06/02/2022   EGFR 93 06/02/2022   GFRNONAA >60 01/07/2022   Lab Results  Component Value Date   CHOL 114 04/28/2022   HDL 53 04/28/2022   LDLCALC 50 04/28/2022   TRIG 43 04/28/2022   CHOLHDL 2.2 04/28/2022   Lab Results  Component Value Date   TSH 1.180 04/28/2022   Lab Results  Component Value Date   HGBA1C 6.8 (A) 08/28/2022   Lab Results  Component Value Date   WBC 4.2 04/28/2022   HGB 13.3 04/28/2022   HCT 39.4 04/28/2022   MCV 85 04/28/2022   PLT 214 04/28/2022   Lab Results  Component Value Date   ALT 17 04/28/2022   AST 24 04/28/2022   ALKPHOS 74 04/28/2022   BILITOT 0.6 04/28/2022   No results found for: "25OHVITD2", "25OHVITD3", "VD25OH"   Review of Systems  Constitutional:  Negative for chills, fatigue and fever.  Respiratory:  Negative for chest tightness and shortness of breath.   Cardiovascular:  Negative for chest pain.  Musculoskeletal:  Positive for neck pain.  Neurological:  Negative for dizziness, light-headedness and headaches.  Psychiatric/Behavioral:  Negative for dysphoric mood and sleep disturbance. The patient is not nervous/anxious.      Patient Active Problem List   Diagnosis Date Noted   Hypokalemia 04/29/2022   Hypercalcemia 01/07/2022   Aortic arch atherosclerosis (HCC) 07/02/2021   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   Lipoma of lateral chest wall 04/18/2020   Porokeratosis 08/05/2019   Hyperlipidemia associated with type 2 diabetes mellitus (HCC) 03/26/2017   Iritis of left eye 03/26/2017   Pre-ulcerative calluses 03/26/2017   Arthritis of right knee 01/15/2016   DM (diabetes mellitus), type 2 with complications (HCC) 03/15/2015   Abnormal LFTs 03/15/2015   Essential (primary) hypertension 03/15/2015   Gastro-esophageal reflux disease without esophagitis 03/15/2015   Allergic rhinitis, seasonal 03/15/2015    Allergies  Allergen Reactions   Ace Inhibitors Cough   Metformin And Related Diarrhea   Penicillins Rash   Sulfa Antibiotics Rash    Past Surgical History:  Procedure Laterality Date   CATARACT EXTRACTION Right 08/2014   CATARACT EXTRACTION Left 04/2016   HAMMER TOE SURGERY     PARTIAL HYSTERECTOMY     fibroids    Social History   Tobacco Use   Smoking status: Never   Smokeless tobacco: Never  Vaping Use   Vaping Use: Never used  Substance Use Topics   Alcohol use: No    Alcohol/week: 0.0 standard drinks of alcohol   Drug use: No     Medication  list has been reviewed and updated.  Current Meds  Medication Sig   acetaminophen (TYLENOL) 500 MG tablet Take 500 mg by mouth every 6 (six) hours as needed.   amLODipine (NORVASC) 2.5 MG tablet Take 1 tablet (2.5 mg total) by mouth daily.   Azelastine HCl 137 MCG/SPRAY SOLN Place into both nostrils.   baclofen (LIORESAL) 10 MG tablet Take 1 tablet (10 mg total) by mouth 3 (three) times daily.   blood glucose meter kit and supplies KIT Dispense based on patient and insurance preference. Test BS bid   dapagliflozin propanediol (FARXIGA) 5 MG TABS tablet Take 1 tablet (5 mg total) by mouth daily before breakfast.   docusate sodium (COLACE)  100 MG capsule Take 100 mg by mouth 2 (two) times daily as needed.   glimepiride (AMARYL) 4 MG tablet Take 1 tablet (4 mg total) by mouth daily before breakfast.   hydrochlorothiazide (HYDRODIURIL) 25 MG tablet TAKE 1 TABLET (25 MG TOTAL) BY MOUTH DAILY.   ibuprofen (ADVIL,MOTRIN) 600 MG tablet TAKE 1 TABLET (600 MG TOTAL) BY MOUTH EVERY 8 (EIGHT) HOURS AS NEEDED.   levocetirizine (XYZAL) 5 MG tablet Take 1 tablet (5 mg total) by mouth every evening.   losartan (COZAAR) 50 MG tablet Take 1 tablet (50 mg total) by mouth daily.   mometasone (NASONEX) 50 MCG/ACT nasal spray Place 2 sprays into the nose daily.   omeprazole (PRILOSEC) 40 MG capsule TAKE 1 CAPSULE BY MOUTH EVERY DAY   OneTouch Delica Lancets 33G MISC TEST TWICE A DAY   ONETOUCH ULTRA test strip TEST TWICE DAILY   potassium chloride SA (KLOR-CON M) 20 MEQ tablet Take 1 tablet (20 mEq total) by mouth daily.   simvastatin (ZOCOR) 10 MG tablet TAKE 1 TABLET BY MOUTH EVERYDAY AT BEDTIME       09/19/2022    4:03 PM 08/28/2022    9:32 AM 04/28/2022    9:39 AM 02/21/2022   10:37 AM  GAD 7 : Generalized Anxiety Score  Nervous, Anxious, on Edge 0 0 0 0  Control/stop worrying 1 0 0 0  Worry too much - different things 1 0 1 0  Trouble relaxing 1 0 0 0  Restless 0 0 0 0  Easily annoyed or irritable 0 0 0 0  Afraid - awful might happen 0 0 0 0  Total GAD 7 Score 3 0 1 0  Anxiety Difficulty Not difficult at all Not difficult at all Not difficult at all Not difficult at all       09/19/2022    4:03 PM 08/28/2022    9:32 AM 05/02/2022    9:45 AM  Depression screen PHQ 2/9  Decreased Interest 0 0 0  Down, Depressed, Hopeless 0 0 0  PHQ - 2 Score 0 0 0  Altered sleeping 1 1   Tired, decreased energy 1 0   Change in appetite 2 1   Feeling bad or failure about yourself  0 0   Trouble concentrating 0 0   Moving slowly or fidgety/restless 0 1   Suicidal thoughts 0 0   PHQ-9 Score 4 3   Difficult doing work/chores Not difficult at all  Not difficult at all     BP Readings from Last 3 Encounters:  09/19/22 112/68  08/28/22 122/74  04/28/22 114/70    Physical Exam Vitals and nursing note reviewed.  Constitutional:      General: She is not in acute distress.    Appearance: She is well-developed.  HENT:     Head: Normocephalic and atraumatic.  Pulmonary:     Effort: Pulmonary effort is normal. No respiratory distress.  Musculoskeletal:        General: Tenderness present.     Cervical back: Spasms and tenderness present. Decreased range of motion.  Skin:    General: Skin is warm and dry.     Findings: No rash.  Neurological:     Mental Status: She is alert and oriented to person, place, and time.     Sensory: Sensation is intact.     Motor: Motor function is intact.     Deep Tendon Reflexes:     Reflex Scores:      Patellar reflexes are 2+ on the right side and 2+ on the left side. Psychiatric:        Mood and Affect: Mood normal.        Behavior: Behavior normal.     Wt Readings from Last 3 Encounters:  09/19/22 165 lb (74.8 kg)  08/28/22 167 lb (75.8 kg)  04/28/22 169 lb (76.7 kg)    BP 112/68   Pulse (!) 106   Ht 5\' 2"  (1.575 m)   Wt 165 lb (74.8 kg)   SpO2 97%   BMI 30.18 kg/m   Assessment and Plan:  Problem List Items Addressed This Visit   None Visit Diagnoses     Neck muscle spasm    -  Primary   continue AS tylenol bid use heat 3-4 times per day Baclofen tid follow up if no improvement   Relevant Medications   baclofen (LIORESAL) 10 MG tablet       No follow-ups on file.   Partially dictated using Dragon software, any errors are not intentional.  Reubin Milan, MD [facility] [place], [place]

## 2022-10-20 ENCOUNTER — Other Ambulatory Visit: Payer: Self-pay | Admitting: Internal Medicine

## 2022-10-20 DIAGNOSIS — E876 Hypokalemia: Secondary | ICD-10-CM

## 2022-12-31 ENCOUNTER — Ambulatory Visit (INDEPENDENT_AMBULATORY_CARE_PROVIDER_SITE_OTHER): Payer: Medicare HMO | Admitting: Internal Medicine

## 2022-12-31 ENCOUNTER — Encounter: Payer: Self-pay | Admitting: Internal Medicine

## 2022-12-31 VITALS — BP 124/70 | HR 68 | Ht 62.0 in | Wt 165.6 lb

## 2022-12-31 DIAGNOSIS — I10 Essential (primary) hypertension: Secondary | ICD-10-CM

## 2022-12-31 DIAGNOSIS — E118 Type 2 diabetes mellitus with unspecified complications: Secondary | ICD-10-CM

## 2022-12-31 DIAGNOSIS — K219 Gastro-esophageal reflux disease without esophagitis: Secondary | ICD-10-CM

## 2022-12-31 DIAGNOSIS — R0981 Nasal congestion: Secondary | ICD-10-CM | POA: Diagnosis not present

## 2022-12-31 DIAGNOSIS — Z7984 Long term (current) use of oral hypoglycemic drugs: Secondary | ICD-10-CM | POA: Diagnosis not present

## 2022-12-31 DIAGNOSIS — E785 Hyperlipidemia, unspecified: Secondary | ICD-10-CM

## 2022-12-31 DIAGNOSIS — E1169 Type 2 diabetes mellitus with other specified complication: Secondary | ICD-10-CM

## 2022-12-31 DIAGNOSIS — Z1231 Encounter for screening mammogram for malignant neoplasm of breast: Secondary | ICD-10-CM | POA: Diagnosis not present

## 2022-12-31 LAB — POCT GLYCOSYLATED HEMOGLOBIN (HGB A1C): Hemoglobin A1C: 6.5 % — AB (ref 4.0–5.6)

## 2022-12-31 MED ORDER — AMLODIPINE BESYLATE 2.5 MG PO TABS: 2.5000 mg | ORAL_TABLET | Freq: Every day | ORAL | 3 refills | Status: DC

## 2022-12-31 MED ORDER — LOSARTAN POTASSIUM 50 MG PO TABS: 50.0000 mg | ORAL_TABLET | Freq: Every day | ORAL | 3 refills | Status: DC

## 2022-12-31 MED ORDER — OMEPRAZOLE 40 MG PO CPDR: 40.0000 mg | DELAYED_RELEASE_CAPSULE | Freq: Every day | ORAL | 3 refills | Status: DC

## 2022-12-31 MED ORDER — GLIMEPIRIDE 2 MG PO TABS: 2.0000 mg | ORAL_TABLET | Freq: Every day | ORAL | 3 refills | Status: DC

## 2022-12-31 MED ORDER — SIMVASTATIN 10 MG PO TABS: ORAL_TABLET | ORAL | 3 refills | Status: DC

## 2022-12-31 MED ORDER — HYDROCHLOROTHIAZIDE 25 MG PO TABS: 25.0000 mg | ORAL_TABLET | Freq: Every day | ORAL | 3 refills | Status: DC

## 2022-12-31 NOTE — Progress Notes (Signed)
Date:  12/31/2022   Name:  Kathy Dougherty   DOB:  [birth year]   MRN:  [id number]   Chief Complaint: Diabetes  Diabetes She presents for her follow-up diabetic visit. She has type 2 diabetes mellitus. Pertinent negatives for hypoglycemia include no headaches or tremors. Pertinent negatives for diabetes include no chest pain, no fatigue, no polydipsia and no polyuria. (Mild lightheadedness and sweats and BS 60 - resolved with eating) Current diabetic treatments: amaryl and farxiga.  Hypertension This is a chronic problem. The problem is controlled. Pertinent negatives include no chest pain, headaches, palpitations or shortness of breath. Past treatments include angiotensin blockers, calcium channel blockers and diuretics. The current treatment provides significant improvement.    Lab Results  Component Value Date   NA 142 06/02/2022   K 3.6 06/02/2022   CO2 27 06/02/2022   GLUCOSE 64 (L) 06/02/2022   BUN 14 06/02/2022   CREATININE 0.69 06/02/2022   CALCIUM 10.3 06/02/2022   EGFR 93 06/02/2022   GFRNONAA >60 01/07/2022   Lab Results  Component Value Date   CHOL 114 04/28/2022   HDL 53 04/28/2022   LDLCALC 50 04/28/2022   TRIG 43 04/28/2022   CHOLHDL 2.2 04/28/2022   Lab Results  Component Value Date   TSH 1.180 04/28/2022   Lab Results  Component Value Date   HGBA1C 6.5 (A) 12/31/2022   Lab Results  Component Value Date   WBC 4.2 04/28/2022   HGB 13.3 04/28/2022   HCT 39.4 04/28/2022   MCV 85 04/28/2022   PLT 214 04/28/2022   Lab Results  Component Value Date   ALT 17 04/28/2022   AST 24 04/28/2022   ALKPHOS 74 04/28/2022   BILITOT 0.6 04/28/2022   No results found for: "25OHVITD2", "25OHVITD3", "VD25OH"   Review of Systems  Constitutional:  Negative for appetite change, fatigue, fever and unexpected weight change.  HENT:  Negative for tinnitus and trouble swallowing.   Eyes:  Negative for visual disturbance.  Respiratory:  Negative for cough, chest  tightness and shortness of breath.   Cardiovascular:  Negative for chest pain, palpitations and leg swelling.  Gastrointestinal:  Negative for abdominal pain.  Endocrine: Negative for polydipsia and polyuria.  Genitourinary:  Negative for dysuria and hematuria.  Musculoskeletal:  Negative for arthralgias.  Neurological:  Negative for tremors, numbness and headaches.  Psychiatric/Behavioral:  Negative for dysphoric mood.     Patient Active Problem List   Diagnosis Date Noted   Hypokalemia 04/29/2022   Hypercalcemia 01/07/2022   Aortic arch atherosclerosis (HCC) 07/02/2021   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   Lipoma of lateral chest wall 04/18/2020   Porokeratosis 08/05/2019   Hyperlipidemia associated with type 2 diabetes mellitus (HCC) 03/26/2017   Iritis of left eye 03/26/2017   Pre-ulcerative calluses 03/26/2017   Arthritis of right knee 01/15/2016   DM (diabetes mellitus), type 2 with complications (HCC) 03/15/2015   Abnormal LFTs 03/15/2015   Essential (primary) hypertension 03/15/2015   Gastro-esophageal reflux disease without esophagitis 03/15/2015   Allergic rhinitis, seasonal 03/15/2015    Allergies  Allergen Reactions   Ace Inhibitors Cough   Metformin And Related Diarrhea   Penicillins Rash   Sulfa Antibiotics Rash    Past Surgical History:  Procedure Laterality Date   CATARACT EXTRACTION Right 08/2014   CATARACT EXTRACTION Left 04/2016   HAMMER TOE SURGERY     PARTIAL HYSTERECTOMY     fibroids    Social History   Tobacco Use   Smoking  status: Never   Smokeless tobacco: Never  Vaping Use   Vaping status: Never Used  Substance Use Topics   Alcohol use: No    Alcohol/week: 0.0 standard drinks of alcohol   Drug use: No     Medication list has been reviewed and updated.  Current Meds  Medication Sig   acetaminophen (TYLENOL) 500 MG tablet Take 500 mg by mouth every 6 (six) hours as needed.   Azelastine HCl 137 MCG/SPRAY SOLN Place into both  nostrils.   blood glucose meter kit and supplies KIT Dispense based on patient and insurance preference. Test BS bid   dapagliflozin propanediol (FARXIGA) 5 MG TABS tablet Take 1 tablet (5 mg total) by mouth daily before breakfast.   docusate sodium (COLACE) 100 MG capsule Take 100 mg by mouth 2 (two) times daily as needed.   ibuprofen (ADVIL,MOTRIN) 600 MG tablet TAKE 1 TABLET (600 MG TOTAL) BY MOUTH EVERY 8 (EIGHT) HOURS AS NEEDED.   KLOR-CON M20 20 MEQ tablet TAKE 1 TABLET BY MOUTH EVERY DAY   levocetirizine (XYZAL) 5 MG tablet Take 1 tablet (5 mg total) by mouth every evening.   mometasone (NASONEX) 50 MCG/ACT nasal spray Place 2 sprays into the nose daily.   OneTouch Delica Lancets 33G MISC TEST TWICE A DAY   ONETOUCH ULTRA test strip TEST TWICE DAILY   [DISCONTINUED] amLODipine (NORVASC) 2.5 MG tablet Take 1 tablet (2.5 mg total) by mouth daily.   [DISCONTINUED] baclofen (LIORESAL) 10 MG tablet Take 1 tablet (10 mg total) by mouth 3 (three) times daily.   [DISCONTINUED] glimepiride (AMARYL) 4 MG tablet Take 1 tablet (4 mg total) by mouth daily before breakfast.   [DISCONTINUED] hydrochlorothiazide (HYDRODIURIL) 25 MG tablet TAKE 1 TABLET (25 MG TOTAL) BY MOUTH DAILY.   [DISCONTINUED] losartan (COZAAR) 50 MG tablet Take 1 tablet (50 mg total) by mouth daily.   [DISCONTINUED] omeprazole (PRILOSEC) 40 MG capsule TAKE 1 CAPSULE BY MOUTH EVERY DAY   [DISCONTINUED] simvastatin (ZOCOR) 10 MG tablet TAKE 1 TABLET BY MOUTH EVERYDAY AT BEDTIME       12/31/2022    1:20 PM 09/19/2022    4:03 PM 08/28/2022    9:32 AM 04/28/2022    9:39 AM  GAD 7 : Generalized Anxiety Score  Nervous, Anxious, on Edge 0 0 0 0  Control/stop worrying 0 1 0 0  Worry too much - different things 0 1 0 1  Trouble relaxing 1 1 0 0  Restless 0 0 0 0  Easily annoyed or irritable 1 0 0 0  Afraid - awful might happen 0 0 0 0  Total GAD 7 Score 2 3 0 1  Anxiety Difficulty Not difficult at all Not difficult at all Not  difficult at all Not difficult at all       12/31/2022    1:19 PM 09/19/2022    4:03 PM 08/28/2022    9:32 AM  Depression screen PHQ 2/9  Decreased Interest 1 0 0  Down, Depressed, Hopeless 1 0 0  PHQ - 2 Score 2 0 0  Altered sleeping 2 1 1   Tired, decreased energy 1 1 0  Change in appetite 0 2 1  Feeling bad or failure about yourself  0 0 0  Trouble concentrating 0 0 0  Moving slowly or fidgety/restless 0 0 1  Suicidal thoughts 0 0 0  PHQ-9 Score 5 4 3   Difficult doing work/chores Not difficult at all Not difficult at all Not difficult at all  BP Readings from Last 3 Encounters:  12/31/22 124/70  09/19/22 112/68  08/28/22 122/74    Physical Exam Vitals and nursing note reviewed.  Constitutional:      General: She is not in acute distress.    Appearance: She is well-developed.  HENT:     Head: Normocephalic and atraumatic.  Pulmonary:     Effort: Pulmonary effort is normal. No respiratory distress.  Skin:    General: Skin is warm and dry.     Findings: No rash.  Neurological:     Mental Status: She is alert and oriented to person, place, and time.  Psychiatric:        Mood and Affect: Mood normal.        Behavior: Behavior normal.    Diabetic Foot Exam - Simple   Simple Foot Form Diabetic Foot exam was performed with the following findings: Yes 12/31/2022  1:27 PM  Visual Inspection No deformities, no ulcerations, no other skin breakdown bilaterally: Yes Sensation Testing Intact to touch and monofilament testing bilaterally: Yes Pulse Check Posterior Tibialis and Dorsalis pulse intact bilaterally: Yes Comments Thick skin on soles of both feet      Wt Readings from Last 3 Encounters:  12/31/22 165 lb 9.6 oz (75.1 kg)  09/19/22 165 lb (74.8 kg)  08/28/22 167 lb (75.8 kg)    BP 124/70   Pulse 68   Ht 5\' 2"  (1.575 m)   Wt 165 lb 9.6 oz (75.1 kg)   SpO2 98%   BMI 30.29 kg/m   Assessment and Plan:  Problem List Items Addressed This Visit        Unprioritized   Hyperlipidemia associated with type 2 diabetes mellitus (HCC) (Chronic)   Relevant Medications   amLODipine (NORVASC) 2.5 MG tablet   glimepiride (AMARYL) 2 MG tablet   hydrochlorothiazide (HYDRODIURIL) 25 MG tablet   losartan (COZAAR) 50 MG tablet   simvastatin (ZOCOR) 10 MG tablet   Gastro-esophageal reflux disease without esophagitis (Chronic)   Relevant Medications   omeprazole (PRILOSEC) 40 MG capsule   Essential (primary) hypertension (Chronic)    Normal exam with stable BP on amlodipine, losartan and hctz. No concerns or side effects to current medication. No change in regimen; continue low sodium diet.       Relevant Medications   amLODipine (NORVASC) 2.5 MG tablet   hydrochlorothiazide (HYDRODIURIL) 25 MG tablet   losartan (COZAAR) 50 MG tablet   simvastatin (ZOCOR) 10 MG tablet   DM (diabetes mellitus), type 2 with complications (HCC) - Primary (Chronic)    Blood sugars stable with a few hypoglycemic symptoms or events. Current regimen is Amaryl and Farxiga. Changes made last visit are none. Lab Results  Component Value Date   HGBA1C 6.8 (A) 08/28/2022  A1C today = 6.5 Reduce glimepiride to 2 mg.       Relevant Medications   glimepiride (AMARYL) 2 MG tablet   losartan (COZAAR) 50 MG tablet   simvastatin (ZOCOR) 10 MG tablet   Other Relevant Orders   POCT glycosylated hemoglobin (Hb A1C) (Completed)   Other Visit Diagnoses     Long term current use of oral hypoglycemic drug       Encounter for screening mammogram for breast cancer       schedule at [facility]   Nasal sinus congestion       continue Mucinex  resume Flonase spray daily       No follow-ups on file.    Reubin Milan, MD [facility] [place]

## 2022-12-31 NOTE — Patient Instructions (Addendum)
Call [facility] to schedule your Mammogram at [phone].  Please schedule your colonoscopy at [facility].  Use fluticasone nasal spray daily to relieve sinus congestion

## 2022-12-31 NOTE — Assessment & Plan Note (Addendum)
Blood sugars stable with a few hypoglycemic symptoms or events. Current regimen is Amaryl and Farxiga. Changes made last visit are none. Lab Results  Component Value Date   HGBA1C 6.8 (A) 08/28/2022  A1C today = 6.5 Reduce glimepiride to 2 mg.

## 2022-12-31 NOTE — Assessment & Plan Note (Signed)
Normal exam with stable BP on amlodipine, losartan and hctz. No concerns or side effects to current medication. No change in regimen; continue low sodium diet.

## 2023-01-20 ENCOUNTER — Ambulatory Visit (INDEPENDENT_AMBULATORY_CARE_PROVIDER_SITE_OTHER): Payer: Medicare HMO

## 2023-01-20 DIAGNOSIS — Z23 Encounter for immunization: Secondary | ICD-10-CM | POA: Diagnosis not present

## 2023-02-10 ENCOUNTER — Ambulatory Visit (INDEPENDENT_AMBULATORY_CARE_PROVIDER_SITE_OTHER): Payer: Medicare HMO | Admitting: Internal Medicine

## 2023-02-10 ENCOUNTER — Encounter: Payer: Self-pay | Admitting: Internal Medicine

## 2023-02-10 VITALS — BP 128/70 | HR 71 | Ht 62.0 in | Wt 167.2 lb

## 2023-02-10 DIAGNOSIS — E118 Type 2 diabetes mellitus with unspecified complications: Secondary | ICD-10-CM

## 2023-02-10 DIAGNOSIS — Z7984 Long term (current) use of oral hypoglycemic drugs: Secondary | ICD-10-CM | POA: Diagnosis not present

## 2023-02-10 DIAGNOSIS — R300 Dysuria: Secondary | ICD-10-CM | POA: Diagnosis not present

## 2023-02-10 DIAGNOSIS — M545 Low back pain, unspecified: Secondary | ICD-10-CM

## 2023-02-10 LAB — POCT URINALYSIS DIPSTICK
Bilirubin, UA: NEGATIVE
Blood, UA: NEGATIVE
Glucose, UA: POSITIVE — AB
Ketones, UA: NEGATIVE
Leukocytes, UA: NEGATIVE
Nitrite, UA: NEGATIVE
Protein, UA: NEGATIVE
Spec Grav, UA: 1.015 (ref 1.010–1.025)
Urobilinogen, UA: 0.2 E.U./dL
pH, UA: 5 (ref 5.0–8.0)

## 2023-02-10 MED ORDER — CYCLOBENZAPRINE HCL 10 MG PO TABS
10.0000 mg | ORAL_TABLET | Freq: Three times a day (TID) | ORAL | 0 refills | Status: DC | PRN
Start: 2023-02-10 — End: 2023-07-21

## 2023-02-10 MED ORDER — DAPAGLIFLOZIN PROPANEDIOL 5 MG PO TABS
5.0000 mg | ORAL_TABLET | Freq: Every day | ORAL | 3 refills | Status: DC
Start: 2023-02-10 — End: 2023-05-04

## 2023-02-10 NOTE — Progress Notes (Signed)
Date:  02/10/2023   Name:  Kathy Dougherty   DOB:  10/20/1950   MRN:  [id number]   Chief Complaint: Back Pain (Lower back pain. Started gradually a month ago. Getting worse at night when trying to lay down and sleep. Patient does have some mild vaginal itching, and pelvic pressure on and off. )  Back Pain This is a recurrent problem. The current episode started 1 to 4 weeks ago. The problem occurs constantly. The pain is present in the lumbar spine and thoracic spine. The pain is moderate. The pain is Worse during the night. Associated symptoms include dysuria. Pertinent negatives include no abdominal pain, chest pain or fever.  Dysuria  This is a new problem. The current episode started 1 to 4 weeks ago. Pertinent negatives include no chills.    Lab Results  Component Value Date   NA 142 06/02/2022   K 3.6 06/02/2022   CO2 27 06/02/2022   GLUCOSE 64 (L) 06/02/2022   BUN 14 06/02/2022   CREATININE 0.69 06/02/2022   CALCIUM 10.3 06/02/2022   EGFR 93 06/02/2022   GFRNONAA >60 01/07/2022   Lab Results  Component Value Date   CHOL 114 04/28/2022   HDL 53 04/28/2022   LDLCALC 50 04/28/2022   TRIG 43 04/28/2022   CHOLHDL 2.2 04/28/2022   Lab Results  Component Value Date   TSH 1.180 04/28/2022   Lab Results  Component Value Date   HGBA1C 6.5 (A) 12/31/2022   Lab Results  Component Value Date   WBC 4.2 04/28/2022   HGB 13.3 04/28/2022   HCT 39.4 04/28/2022   MCV 85 04/28/2022   PLT 214 04/28/2022   Lab Results  Component Value Date   ALT 17 04/28/2022   AST 24 04/28/2022   ALKPHOS 74 04/28/2022   BILITOT 0.6 04/28/2022   No results found for: "25OHVITD2", "25OHVITD3", "VD25OH"   Review of Systems  Constitutional:  Negative for chills, fatigue and fever.  HENT:  Negative for trouble swallowing.   Respiratory:  Negative for chest tightness and shortness of breath.   Cardiovascular:  Negative for chest pain.  Gastrointestinal:  Negative for abdominal pain,  constipation and diarrhea.  Genitourinary:  Positive for dysuria.  Musculoskeletal:  Positive for back pain.  Psychiatric/Behavioral:  Positive for sleep disturbance. Negative for dysphoric mood. The patient is not nervous/anxious.     Patient Active Problem List   Diagnosis Date Noted   Hypokalemia 04/29/2022   Hypercalcemia 01/07/2022   Aortic arch atherosclerosis (HCC) 07/02/2021   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   Lipoma of lateral chest wall 04/18/2020   Porokeratosis 08/05/2019   Hyperlipidemia associated with type 2 diabetes mellitus (HCC) 03/26/2017   Iritis of left eye 03/26/2017   Pre-ulcerative calluses 03/26/2017   Arthritis of right knee 01/15/2016   DM (diabetes mellitus), type 2 with complications (HCC) 03/15/2015   Abnormal LFTs 03/15/2015   Essential (primary) hypertension 03/15/2015   Gastro-esophageal reflux disease without esophagitis 03/15/2015   Allergic rhinitis, seasonal 03/15/2015    Allergies  Allergen Reactions   Ace Inhibitors Cough   Metformin And Related Diarrhea   Penicillins Rash   Sulfa Antibiotics Rash    Past Surgical History:  Procedure Laterality Date   CATARACT EXTRACTION Right 08/2014   CATARACT EXTRACTION Left 04/2016   HAMMER TOE SURGERY     PARTIAL HYSTERECTOMY     fibroids    Social History   Tobacco Use   Smoking status: Never   Smokeless  tobacco: Never  Vaping Use   Vaping status: Never Used  Substance Use Topics   Alcohol use: No    Alcohol/week: 0.0 standard drinks of alcohol   Drug use: No     Medication list has been reviewed and updated.  Current Meds  Medication Sig   acetaminophen (TYLENOL) 500 MG tablet Take 500 mg by mouth every 6 (six) hours as needed.   amLODipine (NORVASC) 2.5 MG tablet Take 1 tablet (2.5 mg total) by mouth daily.   Azelastine HCl 137 MCG/SPRAY SOLN Place into both nostrils.   blood glucose meter kit and supplies KIT Dispense based on patient and insurance preference. Test BS bid    cyclobenzaprine (FLEXERIL) 10 MG tablet Take 1 tablet (10 mg total) by mouth 3 (three) times daily as needed for muscle spasms.   docusate sodium (COLACE) 100 MG capsule Take 100 mg by mouth 2 (two) times daily as needed.   glimepiride (AMARYL) 2 MG tablet Take 1 tablet (2 mg total) by mouth daily before breakfast.   hydrochlorothiazide (HYDRODIURIL) 25 MG tablet Take 1 tablet (25 mg total) by mouth daily.   ibuprofen (ADVIL,MOTRIN) 600 MG tablet TAKE 1 TABLET (600 MG TOTAL) BY MOUTH EVERY 8 (EIGHT) HOURS AS NEEDED.   KLOR-CON M20 20 MEQ tablet TAKE 1 TABLET BY MOUTH EVERY DAY   levocetirizine (XYZAL) 5 MG tablet Take 1 tablet (5 mg total) by mouth every evening.   losartan (COZAAR) 50 MG tablet Take 1 tablet (50 mg total) by mouth daily.   mometasone (NASONEX) 50 MCG/ACT nasal spray Place 2 sprays into the nose daily.   omeprazole (PRILOSEC) 40 MG capsule Take 1 capsule (40 mg total) by mouth daily.   OneTouch Delica Lancets 33G MISC TEST TWICE A DAY   ONETOUCH ULTRA test strip TEST TWICE DAILY   simvastatin (ZOCOR) 10 MG tablet TAKE 1 TABLET BY MOUTH EVERYDAY AT BEDTIME   [DISCONTINUED] dapagliflozin propanediol (FARXIGA) 5 MG TABS tablet Take 1 tablet (5 mg total) by mouth daily before breakfast.       02/10/2023    2:05 PM 12/31/2022    1:20 PM 09/19/2022    4:03 PM 08/28/2022    9:32 AM  GAD 7 : Generalized Anxiety Score  Nervous, Anxious, on Edge 0 0 0 0  Control/stop worrying 0 0 1 0  Worry too much - different things 0 0 1 0  Trouble relaxing 1 1 1  0  Restless 0 0 0 0  Easily annoyed or irritable 0 1 0 0  Afraid - awful might happen 0 0 0 0  Total GAD 7 Score 1 2 3  0  Anxiety Difficulty Not difficult at all Not difficult at all Not difficult at all Not difficult at all       02/10/2023    2:05 PM 12/31/2022    1:19 PM 09/19/2022    4:03 PM  Depression screen PHQ 2/9  Decreased Interest 0 1 0  Down, Depressed, Hopeless 0 1 0  PHQ - 2 Score 0 2 0  Altered sleeping 1 2 1    Tired, decreased energy 1 1 1   Change in appetite 0 0 2  Feeling bad or failure about yourself  0 0 0  Trouble concentrating 1 0 0  Moving slowly or fidgety/restless 0 0 0  Suicidal thoughts 0 0 0  PHQ-9 Score 3 5 4   Difficult doing work/chores Not difficult at all Not difficult at all Not difficult at all    BP  Readings from Last 3 Encounters:  02/10/23 128/70  12/31/22 124/70  09/19/22 112/68    Physical Exam Vitals and nursing note reviewed.  Constitutional:      General: She is not in acute distress.    Appearance: Normal appearance. She is well-developed.  HENT:     Head: Normocephalic and atraumatic.  Cardiovascular:     Rate and Rhythm: Normal rate and regular rhythm.     Heart sounds: No murmur heard. Pulmonary:     Effort: Pulmonary effort is normal. No respiratory distress.     Breath sounds: No wheezing or rhonchi.  Musculoskeletal:     Cervical back: Normal range of motion.     Thoracic back: Spasms present. No tenderness or bony tenderness. Decreased range of motion. No scoliosis.     Lumbar back: Spasms and tenderness present. Decreased range of motion. Negative right straight leg raise test and negative left straight leg raise test.     Right lower leg: No edema.     Left lower leg: No edema.  Skin:    General: Skin is warm and dry.     Capillary Refill: Capillary refill takes less than 2 seconds.     Findings: No rash.  Neurological:     General: No focal deficit present.     Mental Status: She is alert and oriented to person, place, and time.     Sensory: Sensation is intact.     Motor: Motor function is intact.     Gait: Gait is intact.     Deep Tendon Reflexes:     Reflex Scores:      Patellar reflexes are 3+ on the right side and 3+ on the left side. Psychiatric:        Mood and Affect: Mood normal.        Behavior: Behavior normal.     Lab Results  Component Value Date   COLORU yellow 02/10/2023   CLARITYU clear 02/10/2023   GLUCOSEUR  Positive (A) 02/10/2023   BILIRUBINUR neg 02/10/2023   KETONESU neg 02/10/2023   SPECGRAV 1.015 02/10/2023   RBCUR neg 02/10/2023   PHUR 5.0 02/10/2023   PROTEINUR Negative 02/10/2023   UROBILINOGEN 0.2 02/10/2023   LEUKOCYTESUR Negative 02/10/2023    Wt Readings from Last 3 Encounters:  02/10/23 167 lb 3.2 oz (75.8 kg)  12/31/22 165 lb 9.6 oz (75.1 kg)  09/19/22 165 lb (74.8 kg)    BP 128/70   Pulse 71   Ht 5\' 2"  (1.575 m)   Wt 167 lb 3.2 oz (75.8 kg)   SpO2 97%   BMI 30.58 kg/m   Assessment and Plan:  Problem List Items Addressed This Visit       Unprioritized   DM (diabetes mellitus), type 2 with complications (HCC) (Chronic)   Relevant Medications   dapagliflozin propanediol (FARXIGA) 5 MG TABS tablet   Other Visit Diagnoses     Acute bilateral low back pain without sciatica    -  Primary   Relevant Medications   cyclobenzaprine (FLEXERIL) 10 MG tablet   Other Relevant Orders   POCT Urinalysis Dipstick (Completed)   Dysuria       UA negative could be mild yeast from [place] - will monitor   Long term current use of oral hypoglycemic drug       getting Farxiga from [facility] will see about sending new Rx       No follow-ups on file.    Reubin Milan, MD [facility] [place]

## 2023-02-24 DIAGNOSIS — H524 Presbyopia: Secondary | ICD-10-CM | POA: Diagnosis not present

## 2023-02-24 DIAGNOSIS — Z0100 Encounter for examination of eyes and vision without abnormal findings: Secondary | ICD-10-CM | POA: Diagnosis not present

## 2023-02-24 LAB — HM DIABETES EYE EXAM

## 2023-03-27 ENCOUNTER — Encounter: Payer: Self-pay | Admitting: Family Medicine

## 2023-03-27 ENCOUNTER — Ambulatory Visit: Payer: Self-pay | Admitting: *Deleted

## 2023-03-27 ENCOUNTER — Telehealth: Payer: Self-pay | Admitting: Internal Medicine

## 2023-03-27 ENCOUNTER — Ambulatory Visit (INDEPENDENT_AMBULATORY_CARE_PROVIDER_SITE_OTHER): Payer: Medicare HMO | Admitting: Family Medicine

## 2023-03-27 VITALS — BP 104/78 | HR 90 | Temp 98.2°F | Ht 62.0 in | Wt 169.0 lb

## 2023-03-27 DIAGNOSIS — J0100 Acute maxillary sinusitis, unspecified: Secondary | ICD-10-CM | POA: Insufficient documentation

## 2023-03-27 MED ORDER — AZITHROMYCIN 250 MG PO TABS
ORAL_TABLET | ORAL | 0 refills | Status: AC
Start: 2023-03-27 — End: 2023-04-01

## 2023-03-27 MED ORDER — PROMETHAZINE-DM 6.25-15 MG/5ML PO SYRP: 5.0000 mL | ORAL_SOLUTION | Freq: Four times a day (QID) | ORAL | 0 refills | Status: DC | PRN

## 2023-03-27 MED ORDER — FLUCONAZOLE 150 MG PO TABS
150.0000 mg | ORAL_TABLET | Freq: Once | ORAL | 1 refills | Status: AC
Start: 2023-03-27 — End: 2023-03-27

## 2023-03-27 NOTE — Telephone Encounter (Signed)
Summary: sinus concern / headache   The patient would like to speak with a member of clinical staff when possible regarding their congestion  The patient has experienced sinus discomfort for roughly 1 week and would like to speak with a member of staff about an ongoing headache  Please contact when available    Called pt - left message on machine to return call. After 3 attempts unable to contact pt. Will forward encounter to clinic for follow up.

## 2023-03-27 NOTE — Telephone Encounter (Signed)
Summary: sinus concern / headache   The patient would like to speak with a member of clinical staff when possible regarding their congestion  The patient has experienced sinus discomfort for roughly 1 week and would like to speak with a member of staff about an ongoing headache  Please contact when available    Called pt - left message on machine to return our call.

## 2023-03-27 NOTE — Telephone Encounter (Signed)
Attempted to return her call.   Left a voicemail to call back. 

## 2023-03-27 NOTE — Telephone Encounter (Signed)
Message from [place] C sent at 03/27/2023 10:23 AM EST  Summary: sinus concern / headache   The patient would like to speak with a member of clinical staff when possible regarding their congestion  The patient has experienced sinus discomfort for roughly 1 week and would like to speak with a member of staff about an ongoing headache  Please contact when available          Call History  Contact Date/Time Type Contact Phone/Fax User  03/27/2023 10:21 AM EST Phone (Incoming) Acha, [place] (Self) [phone] (H) Coley, [place] A

## 2023-03-27 NOTE — Assessment & Plan Note (Signed)
Pertinent History: - Reports sinus issues for 1.5 weeks, not improving - Coughing at night - Nose and sinuses dry, thick mucus, not runny - Long-standing sinus issues - Right-sided sinus pain - Ear pressure and tenderness on right side - No throat pain, just irritation with cough - Breathing seems normal, no shortness of breath - Occasional body aches, but nothing new - No known sick contacts, wears mask around family - Has not checked for COVID-19 recently - Taking Mucinex and allergy medication without improvement  Exam: - Lung auscultation performed, benign - Cardiac findings benign - Sinus tenderness bilateral maxillary regions, HEENT otherwise benign  Impression: - Acute sinusitis - Possibility of Viral upper respiratory infection, COVID-19 infection, allergic rhinitis  Plan: - Perform COVID-19 swab test in office - If COVID-19 negative, treat for bacterial sinusitis with antibiotics given history, comorbid conditions, and duration of symptoms - If COVID-19 positive, initiate antiviral therapy - Continue symptomatic management with Mucinex and allergy medication - Follow up as needed if symptoms persist or worsen  Patient Plan: - Acute sinusitis: - COVID-19 swab test to be done today - Antibiotics will be prescribed if test is negative - Antiviral medication will be given if test is positive - Continue Mucinex and allergy medicine for symptom relief - Return if fever >101F, worsening pain, or no improvement in 7 days

## 2023-03-27 NOTE — Telephone Encounter (Signed)
Patient stated that she is still trying to connect with [facility] prescription program for the medication. She asked if she's not able to reach them what else can dr berglund prescribe for her.

## 2023-03-27 NOTE — Patient Instructions (Addendum)
Acute sinusitis: - Take antibiotics for full course - Use diflucan if you see yeast - Use Rx cough syrup as-needed - Continue Mucinex and allergy medicine for symptom relief - Return if fever >101F, worsening pain, or no improvement in 7 days

## 2023-03-27 NOTE — Progress Notes (Signed)
Primary Care / Sports Medicine Office Visit  Patient Information:  Patient ID: Kathy Dougherty, female DOB: 1951/03/15 Age: 72 y.o. MRN: [id number]   Kathy Dougherty is a pleasant 72 y.o. female presenting with the following:  Chief Complaint  Patient presents with   Sinusitis    X1.5 weeks, not getting, cough at night dry cough, sinus pressure     Vitals:   03/27/23 1445  BP: 104/78  Pulse: 90  Temp: 98.2 F (36.8 C)  SpO2: 98%   Vitals:   03/27/23 1445  Weight: 169 lb (76.7 kg)  Height: 5\' 2"  (1.575 m)   Body mass index is 30.91 kg/m.  No results found.   Independent interpretation of notes and tests performed by another provider:   None  Procedures performed:   None  Pertinent History, Exam, Impression, and Recommendations:   Problem List Items Addressed This Visit       Respiratory   Acute non-recurrent maxillary sinusitis - Primary    Pertinent History: - Reports sinus issues for 1.5 weeks, not improving - Coughing at night - Nose and sinuses dry, thick mucus, not runny - Long-standing sinus issues - Right-sided sinus pain - Ear pressure and tenderness on right side - No throat pain, just irritation with cough - Breathing seems normal, no shortness of breath - Occasional body aches, but nothing new - No known sick contacts, wears mask around family - Has not checked for COVID-19 recently - Taking Mucinex and allergy medication without improvement  Exam: - Lung auscultation performed, benign - Cardiac findings benign - Sinus tenderness bilateral maxillary regions, HEENT otherwise benign  Impression: - Acute sinusitis - Possibility of Viral upper respiratory infection, COVID-19 infection, allergic rhinitis  Plan: - Perform COVID-19 swab test in office - If COVID-19 negative, treat for bacterial sinusitis with antibiotics given history, comorbid conditions, and duration of symptoms - If COVID-19 positive, initiate antiviral therapy -  Continue symptomatic management with Mucinex and allergy medication - Follow up as needed if symptoms persist or worsen  Patient Plan: - Acute sinusitis: - COVID-19 swab test to be done today - Antibiotics will be prescribed if test is negative - Antiviral medication will be given if test is positive - Continue Mucinex and allergy medicine for symptom relief - Return if fever >101F, worsening pain, or no improvement in 7 days      Relevant Medications   azithromycin (ZITHROMAX) 250 MG tablet   promethazine-dextromethorphan (PROMETHAZINE-DM) 6.25-15 MG/5ML syrup   fluconazole (DIFLUCAN) 150 MG tablet     Orders & Medications Medications:  Meds ordered this encounter  Medications   azithromycin (ZITHROMAX) 250 MG tablet    Sig: Take 2 tablets on day 1, then 1 tablet daily on days 2 through 5    Dispense:  6 tablet    Refill:  0   promethazine-dextromethorphan (PROMETHAZINE-DM) 6.25-15 MG/5ML syrup    Sig: Take 5 mLs by mouth 4 (four) times daily as needed for cough.    Dispense:  118 mL    Refill:  0   fluconazole (DIFLUCAN) 150 MG tablet    Sig: Take 1 tablet (150 mg total) by mouth once for 1 dose.    Dispense:  1 tablet    Refill:  1   No orders of the defined types were placed in this encounter.    No follow-ups on file.     Jerrol Banana, MD, [facility]   Primary Care Sports Medicine Primary Care and Sports Medicine  at [facility]

## 2023-03-30 ENCOUNTER — Other Ambulatory Visit: Payer: Self-pay | Admitting: Internal Medicine

## 2023-03-30 DIAGNOSIS — E876 Hypokalemia: Secondary | ICD-10-CM

## 2023-03-31 NOTE — Telephone Encounter (Signed)
Requested Prescriptions  Pending Prescriptions Disp Refills   KLOR-CON M20 20 MEQ tablet [Pharmacy Med Name: KLOR-CON M20 TABLET] 90 tablet 1    Sig: TAKE 1 TABLET BY MOUTH EVERY DAY     Endocrinology:  Minerals - Potassium Supplementation Passed - 03/30/2023 11:00 AM      Passed - K in normal range and within 360 days    Potassium  Date Value Ref Range Status  06/02/2022 3.6 3.5 - 5.2 mmol/L Final         Passed - Cr in normal range and within 360 days    Creatinine, Ser  Date Value Ref Range Status  06/02/2022 0.69 0.57 - 1.00 mg/dL Final         Passed - Valid encounter within last 12 months    Recent Outpatient Visits           4 days ago Acute non-recurrent maxillary sinusitis   [place] [facility] at [facility], Ocie Bob, MD   1 month ago Acute bilateral low back pain without sciatica   [place] [facility] at [facility], Nyoka Cowden, MD   3 months ago DM (diabetes mellitus), type 2 with complications [facility])   [place] [facility] at [facility], Nyoka Cowden, MD   6 months ago Neck muscle spasm   [place] [facility] at [facility], Nyoka Cowden, MD   7 months ago DM (diabetes mellitus), type 2 with complications [facility])   [place] [facility] at [facility], Nyoka Cowden, MD       Future Appointments             In 1 month Judithann Graves, Nyoka Cowden, MD [facility] Care & [facility] Medicine at [facility]

## 2023-05-01 ENCOUNTER — Ambulatory Visit (INDEPENDENT_AMBULATORY_CARE_PROVIDER_SITE_OTHER): Payer: Medicare HMO | Admitting: Internal Medicine

## 2023-05-01 ENCOUNTER — Encounter: Payer: Self-pay | Admitting: Internal Medicine

## 2023-05-01 VITALS — BP 122/74 | HR 78 | Ht 62.0 in | Wt 171.0 lb

## 2023-05-01 DIAGNOSIS — Z0000 Encounter for general adult medical examination without abnormal findings: Secondary | ICD-10-CM

## 2023-05-01 DIAGNOSIS — Z1231 Encounter for screening mammogram for malignant neoplasm of breast: Secondary | ICD-10-CM

## 2023-05-01 DIAGNOSIS — E785 Hyperlipidemia, unspecified: Secondary | ICD-10-CM | POA: Diagnosis not present

## 2023-05-01 DIAGNOSIS — Z7984 Long term (current) use of oral hypoglycemic drugs: Secondary | ICD-10-CM | POA: Diagnosis not present

## 2023-05-01 DIAGNOSIS — Z1211 Encounter for screening for malignant neoplasm of colon: Secondary | ICD-10-CM | POA: Diagnosis not present

## 2023-05-01 DIAGNOSIS — E1169 Type 2 diabetes mellitus with other specified complication: Secondary | ICD-10-CM | POA: Diagnosis not present

## 2023-05-01 DIAGNOSIS — K219 Gastro-esophageal reflux disease without esophagitis: Secondary | ICD-10-CM | POA: Diagnosis not present

## 2023-05-01 DIAGNOSIS — I10 Essential (primary) hypertension: Secondary | ICD-10-CM | POA: Diagnosis not present

## 2023-05-01 DIAGNOSIS — E118 Type 2 diabetes mellitus with unspecified complications: Secondary | ICD-10-CM | POA: Diagnosis not present

## 2023-05-01 NOTE — Assessment & Plan Note (Signed)
 Controlled BP with normal exam. Current regimen is amlodipine, losartan, hydrochlorothiazide . Will continue same medications; encourage continued reduced sodium diet.

## 2023-05-01 NOTE — Progress Notes (Signed)
Date:  05/01/2023   Name:  Kathy Dougherty   DOB:  03-Jun-1950   MRN:  [id number]   Chief Complaint: Annual Exam Kathy Dougherty is a 72 y.o. female who presents today for her Complete Annual Exam. She feels fairly well. She reports exercising some. She reports she is sleeping poorly. Breast complaints - none.  Mammogram: 04/2022 - scheduled at [facility] DEXA: 08/2017 Colonoscopy: 12/2012 [facility] Maintenance Due  Topic Date Due   DTaP/Tdap/Td (1 - Tdap) Never done   Zoster Vaccines- Shingrix (1 of 2) 02/09/1970   Colonoscopy  12/22/2022   COVID-19 Vaccine (5 - 2024-25 season) 01/18/2023   Medicare Annual Wellness (AWV)  05/03/2023    Immunization History  Administered Date(s) Administered   Fluad Quad(high Dose 65+) 02/18/2019, 03/13/2020, 01/28/2021, 03/12/2022   Fluad Trivalent(High Dose 65+) 01/20/2023   Influenza, High Dose Seasonal PF 03/23/2017, 03/22/2018   Influenza,inj,Quad PF,6+ Mos 03/19/2015, 03/20/2016   Moderna Covid-19 Vaccine Bivalent Booster 64yrs & up 03/01/2021   Moderna Sars-Covid-2 Vaccination 06/24/2019, 07/26/2019, 03/19/2020   Pneumococcal Conjugate-13 03/20/2016   Pneumococcal Polysaccharide-23 03/23/2017   Zoster, Live 11/05/2012     Hypertension This is a chronic problem. The problem is controlled. Pertinent negatives include no chest pain, headaches, palpitations or shortness of breath. Past treatments include calcium channel blockers, angiotensin blockers and diuretics. The current treatment provides significant improvement. There is no history of CVA or PVD.  Diabetes She presents for her follow-up diabetic visit. She has type 2 diabetes mellitus. Her disease course has been stable. There are no hypoglycemic associated symptoms. Pertinent negatives for hypoglycemia include no headaches or tremors. Pertinent negatives for diabetes include no chest pain, no fatigue, no polydipsia and no polyuria. Pertinent negatives for diabetic complications  include no CVA, nephropathy or PVD. Current diabetic treatment includes oral agent (dual therapy). Eye exam is current.  Hyperlipidemia This is a chronic problem. The problem is controlled. Pertinent negatives include no chest pain, focal weakness, myalgias or shortness of breath. Current antihyperlipidemic treatment includes statins. The current treatment provides significant improvement of lipids.  Gastroesophageal Reflux She complains of heartburn. She reports no abdominal pain, no chest pain or no coughing. This is a recurrent problem. The problem occurs rarely. Pertinent negatives include no fatigue. She has tried a PPI for the symptoms.    Review of Systems  Constitutional:  Negative for appetite change, fatigue, fever and unexpected weight change.  HENT:  Negative for tinnitus and trouble swallowing.   Eyes:  Negative for visual disturbance.  Respiratory:  Negative for cough, chest tightness and shortness of breath.   Cardiovascular:  Negative for chest pain, palpitations and leg swelling.  Gastrointestinal:  Positive for heartburn. Negative for abdominal pain.  Endocrine: Negative for polydipsia and polyuria.  Genitourinary:  Negative for dysuria and hematuria.  Musculoskeletal:  Negative for arthralgias and myalgias.  Neurological:  Negative for tremors, focal weakness, numbness and headaches.  Psychiatric/Behavioral:  Negative for dysphoric mood.      Lab Results  Component Value Date   NA 142 06/02/2022   K 3.6 06/02/2022   CO2 27 06/02/2022   GLUCOSE 64 (L) 06/02/2022   BUN 14 06/02/2022   CREATININE 0.69 06/02/2022   CALCIUM 10.3 06/02/2022   EGFR 93 06/02/2022   GFRNONAA >60 01/07/2022   Lab Results  Component Value Date   CHOL 114 04/28/2022   HDL 53 04/28/2022   LDLCALC 50 04/28/2022   TRIG 43 04/28/2022   CHOLHDL 2.2 04/28/2022  Lab Results  Component Value Date   TSH 1.180 04/28/2022   Lab Results  Component Value Date   HGBA1C 6.5 (A) 12/31/2022    Lab Results  Component Value Date   WBC 4.2 04/28/2022   HGB 13.3 04/28/2022   HCT 39.4 04/28/2022   MCV 85 04/28/2022   PLT 214 04/28/2022   Lab Results  Component Value Date   ALT 17 04/28/2022   AST 24 04/28/2022   ALKPHOS 74 04/28/2022   BILITOT 0.6 04/28/2022   No results found for: "25OHVITD2", "25OHVITD3", "VD25OH"   Patient Active Problem List   Diagnosis Date Noted   Hypokalemia 04/29/2022   Hypercalcemia 01/07/2022   Aortic arch atherosclerosis (HCC) 07/02/2021   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   Lipoma of lateral chest wall 04/18/2020   Porokeratosis 08/05/2019   Hyperlipidemia associated with type 2 diabetes mellitus (HCC) 03/26/2017   Iritis of left eye 03/26/2017   Pre-ulcerative calluses 03/26/2017   Arthritis of right knee 01/15/2016   DM (diabetes mellitus), type 2 with complications (HCC) 03/15/2015   Abnormal LFTs 03/15/2015   Essential (primary) hypertension 03/15/2015   Gastro-esophageal reflux disease without esophagitis 03/15/2015   Allergic rhinitis, seasonal 03/15/2015    Allergies  Allergen Reactions   Ace Inhibitors Cough   Metformin And Related Diarrhea   Penicillins Rash   Sulfa Antibiotics Rash    Past Surgical History:  Procedure Laterality Date   CATARACT EXTRACTION Right 08/2014   CATARACT EXTRACTION Left 04/2016   HAMMER TOE SURGERY     PARTIAL HYSTERECTOMY     fibroids    Social History   Tobacco Use   Smoking status: Never   Smokeless tobacco: Never  Vaping Use   Vaping status: Never Used  Substance Use Topics   Alcohol use: No    Alcohol/week: 0.0 standard drinks of alcohol   Drug use: No     Medication list has been reviewed and updated.  Current Meds  Medication Sig   acetaminophen (TYLENOL) 500 MG tablet Take 500 mg by mouth every 6 (six) hours as needed.   amLODipine (NORVASC) 2.5 MG tablet Take 1 tablet (2.5 mg total) by mouth daily.   Azelastine HCl 137 MCG/SPRAY SOLN Place into both nostrils.    blood glucose meter kit and supplies KIT Dispense based on patient and insurance preference. Test BS bid   cyclobenzaprine (FLEXERIL) 10 MG tablet Take 1 tablet (10 mg total) by mouth 3 (three) times daily as needed for muscle spasms.   dapagliflozin propanediol (FARXIGA) 5 MG TABS tablet Take 1 tablet (5 mg total) by mouth daily before breakfast.   docusate sodium (COLACE) 100 MG capsule Take 100 mg by mouth 2 (two) times daily as needed.   glimepiride (AMARYL) 2 MG tablet Take 1 tablet (2 mg total) by mouth daily before breakfast.   hydrochlorothiazide (HYDRODIURIL) 25 MG tablet Take 1 tablet (25 mg total) by mouth daily.   ibuprofen (ADVIL,MOTRIN) 600 MG tablet TAKE 1 TABLET (600 MG TOTAL) BY MOUTH EVERY 8 (EIGHT) HOURS AS NEEDED.   levocetirizine (XYZAL) 5 MG tablet Take 1 tablet (5 mg total) by mouth every evening.   losartan (COZAAR) 50 MG tablet Take 1 tablet (50 mg total) by mouth daily.   mometasone (NASONEX) 50 MCG/ACT nasal spray Place 2 sprays into the nose daily.   omeprazole (PRILOSEC) 40 MG capsule Take 1 capsule (40 mg total) by mouth daily.   OneTouch Delica Lancets 33G MISC TEST TWICE A DAY  ONETOUCH ULTRA test strip TEST TWICE DAILY   potassium chloride SA (KLOR-CON M20) 20 MEQ tablet TAKE 1 TABLET BY MOUTH EVERY DAY   simvastatin (ZOCOR) 10 MG tablet TAKE 1 TABLET BY MOUTH EVERYDAY AT BEDTIME       05/01/2023    9:39 AM 03/27/2023    2:50 PM 02/10/2023    2:05 PM 12/31/2022    1:20 PM  GAD 7 : Generalized Anxiety Score  Nervous, Anxious, on Edge 0 0 0 0  Control/stop worrying 0 0 0 0  Worry too much - different things 0 0 0 0  Trouble relaxing 0 0 1 1  Restless 0 0 0 0  Easily annoyed or irritable 0 0 0 1  Afraid - awful might happen 0 0 0 0  Total GAD 7 Score 0 0 1 2  Anxiety Difficulty Not difficult at all Not difficult at all Not difficult at all Not difficult at all       05/01/2023    9:39 AM 03/27/2023    2:50 PM 02/10/2023    2:05 PM  Depression screen  PHQ 2/9  Decreased Interest 0 0 0  Down, Depressed, Hopeless 0 0 0  PHQ - 2 Score 0 0 0  Altered sleeping 2 0 1  Tired, decreased energy 2 0 1  Change in appetite 0 0 0  Feeling bad or failure about yourself  0 0 0  Trouble concentrating 0 0 1  Moving slowly or fidgety/restless 0 0 0  Suicidal thoughts 0 0 0  PHQ-9 Score 4 0 3  Difficult doing work/chores Not difficult at all Not difficult at all Not difficult at all    BP Readings from Last 3 Encounters:  05/01/23 122/74  03/27/23 104/78  02/10/23 128/70    Physical Exam Vitals and nursing note reviewed.  Constitutional:      General: She is not in acute distress.    Appearance: She is well-developed.  HENT:     Head: Normocephalic and atraumatic.     Right Ear: Tympanic membrane and ear canal normal.     Left Ear: Tympanic membrane and ear canal normal.     Nose:     Right Sinus: No maxillary sinus tenderness.     Left Sinus: No maxillary sinus tenderness.  Eyes:     General: No scleral icterus.       Right eye: No discharge.        Left eye: No discharge.     Conjunctiva/sclera: Conjunctivae normal.  Neck:     Thyroid: No thyromegaly.     Vascular: No carotid bruit.  Cardiovascular:     Rate and Rhythm: Normal rate and regular rhythm.     Pulses: Normal pulses.     Heart sounds: Normal heart sounds.  Pulmonary:     Effort: Pulmonary effort is normal. No respiratory distress.     Breath sounds: No wheezing.  Chest:  Breasts:    Right: Normal.     Left: Normal.  Abdominal:     General: Bowel sounds are normal.     Palpations: Abdomen is soft.     Tenderness: There is no abdominal tenderness.  Musculoskeletal:     Cervical back: Normal range of motion. No erythema.     Right lower leg: No edema.     Left lower leg: No edema.  Lymphadenopathy:     Cervical: No cervical adenopathy.  Skin:    General: Skin is warm and dry.  Findings: No rash.  Neurological:     Mental Status: She is alert and oriented  to person, place, and time.     Cranial Nerves: No cranial nerve deficit.     Sensory: No sensory deficit.     Deep Tendon Reflexes: Reflexes are normal and symmetric.  Psychiatric:        Attention and Perception: Attention normal.        Mood and Affect: Mood normal.     Wt Readings from Last 3 Encounters:  05/01/23 171 lb (77.6 kg)  03/27/23 169 lb (76.7 kg)  02/10/23 167 lb 3.2 oz (75.8 kg)    BP 122/74   Pulse 78   Ht 5\' 2"  (1.575 m)   Wt 171 lb (77.6 kg)   SpO2 98%   BMI 31.28 kg/m   Assessment and Plan:  Problem List Items Addressed This Visit       Unprioritized   DM (diabetes mellitus), type 2 with complications (HCC) (Chronic)   Blood sugars stable without hypoglycemic symptoms or events. Currently managed with Farxiga and glimepiride. Changes made last visit are reducing dose of glimepiride. Lab Results  Component Value Date   HGBA1C 6.5 (A) 12/31/2022  Uses [facility] for Marcelline Deist - will refer to pharmacy to update paperwork.       Relevant Orders   Comprehensive metabolic panel   Hemoglobin A1c   AMB Referral [facility] Care Management   Essential (primary) hypertension (Chronic)   Controlled BP with normal exam. Current regimen is amlodipine, losartan, hydrochlorothiazide. Will continue same medications; encourage continued reduced sodium diet.       Relevant Orders   CBC with Differential/Platelet   TSH   Gastro-esophageal reflux disease without esophagitis (Chronic)   Reflux symptoms are minimal on current therapy - omeprazole. No red flag signs such as weight loss, n/v, melena       Relevant Orders   CBC with Differential/Platelet   Hyperlipidemia associated with type 2 diabetes mellitus (HCC) (Chronic)   LDL is  Lab Results  Component Value Date   LDLCALC 50 04/28/2022    Current regimen is simvastatin.  Tolerating medications well without issues.       Relevant Orders   Lipid panel   Other Visit Diagnoses       Annual physical exam     -  Primary     Encounter for screening mammogram for breast cancer       Scheduled for next week at [facility]     Colon cancer screening       Relevant Orders   Ambulatory referral to Gastroenterology     Long term current use of oral hypoglycemic drug           Return in about 4 months (around 08/30/2023) for DM, HTN.    Reubin Milan, MD [facility] [place]

## 2023-05-01 NOTE — Assessment & Plan Note (Signed)
Reflux symptoms are minimal on current therapy - omeprazole. No red flag signs such as weight loss, n/v, melena  

## 2023-05-01 NOTE — Assessment & Plan Note (Addendum)
Blood sugars stable without hypoglycemic symptoms or events. Currently managed with Farxiga and glimepiride. Changes made last visit are reducing dose of glimepiride. Lab Results  Component Value Date   HGBA1C 6.5 (A) 12/31/2022  Uses [facility] for Marcelline Deist - will refer to pharmacy to update paperwork.

## 2023-05-01 NOTE — Assessment & Plan Note (Signed)
LDL is  Lab Results  Component Value Date   LDLCALC 50 04/28/2022    Current regimen is simvastatin.  Tolerating medications well without issues.

## 2023-05-01 NOTE — Patient Instructions (Signed)
[facility] [phone] Please call to follow up on Farxiga prescription.

## 2023-05-04 ENCOUNTER — Telehealth: Payer: Self-pay | Admitting: Pharmacist

## 2023-05-04 ENCOUNTER — Encounter: Payer: Self-pay | Admitting: Pharmacist

## 2023-05-04 ENCOUNTER — Other Ambulatory Visit: Payer: Self-pay | Admitting: Pharmacist

## 2023-05-04 DIAGNOSIS — I10 Essential (primary) hypertension: Secondary | ICD-10-CM | POA: Diagnosis not present

## 2023-05-04 DIAGNOSIS — E1169 Type 2 diabetes mellitus with other specified complication: Secondary | ICD-10-CM | POA: Diagnosis not present

## 2023-05-04 DIAGNOSIS — K219 Gastro-esophageal reflux disease without esophagitis: Secondary | ICD-10-CM | POA: Diagnosis not present

## 2023-05-04 DIAGNOSIS — E785 Hyperlipidemia, unspecified: Secondary | ICD-10-CM | POA: Diagnosis not present

## 2023-05-04 DIAGNOSIS — E118 Type 2 diabetes mellitus with unspecified complications: Secondary | ICD-10-CM

## 2023-05-04 MED ORDER — DAPAGLIFLOZIN PROPANEDIOL 5 MG PO TABS: 5.0000 mg | ORAL_TABLET | Freq: Every day | ORAL | 3 refills | Status: DC

## 2023-05-04 NOTE — Telephone Encounter (Signed)
Dr. Judithann Graves, patient is re-enrolled in patient assistance for Farxiga for 2025. Would you please send renewal of her Farxiga prescription to [facility] (dispensing pharmacy for [facility]) for patient - I have pended this renewal within this encounter.  Thank you!  Estelle Grumbles, PharmD, [facility] [phone]

## 2023-05-04 NOTE — Telephone Encounter (Signed)
This encounter was created in error - please disregard.

## 2023-05-04 NOTE — Patient Instructions (Signed)
Goals Addressed             This Visit's Progress    Pharmacy Goals       If you need to reach out to patient assistance programs regarding refills or to find out the status of your application, you can do so by calling:  [employer] at [phone]  The goal A1c is less than 7%. This is the best way to reduce the risk of the long term complications of diabetes, including heart disease, kidney disease, eye disease, strokes, and nerve damage. An A1c of less than 7% corresponds with fasting sugars less than 130 and 2 hour after meal sugars less than 180.  Our goal bad cholesterol, or LDL, is less than 70 . This is why it is important to continue taking your simvastatin.  Estelle Grumbles, PharmD, [facility] [phone]

## 2023-05-04 NOTE — Progress Notes (Signed)
05/04/2023 Name: Kathy Dougherty MRN: [id number] DOB: 09/27/1950  Chief Complaint  Patient presents with   Medication Management   Medication Assistance    Kathy Dougherty is a 72 y.o. year old female who presented for a telephone visit.   They were referred to the pharmacist by their PCP for assistance in managing medication access.    Subjective:  Care Team: Primary Care Provider: Reubin Milan, MD ; Next Scheduled Visit: 08/31/2023  Medication Access/Adherence  Current Pharmacy:  [facility] [address] [place] - [address] [place] [address] Phone: [phone] Fax: [phone]  [id number] - [place], [place] - [address] [place] [address] Phone: [phone] Fax: [phone]   Patient reports affordability concerns with their medications: No  Patient reports access/transportation concerns to their pharmacy: No  Patient reports adherence concerns with their medications:  No    Reports that she is going back to PCP office today to complete her lab work  Diabetes:  Current medications:  - Farxiga 5 mg daily - Glimepiride 2 mg daily with breakfast  Current morning fasting glucose readings ranging 90s-146  Patient denies hypoglycemic s/sx including dizziness, shakiness, sweating.   Statin therapy: simvastatin 10 mg daily  Current medication access support: enrolled in patient assistance for Farxiga from [employer] through 05/19/2023 - Denies having yet completed re-enrollment via Writer for 2025 - Reports received 90 day supply of Farxiga in mail from program last week  Objective:  Lab Results  Component Value Date   HGBA1C 6.5 (A) 12/31/2022    Lab Results  Component Value Date   CREATININE 0.69 06/02/2022   BUN 14 06/02/2022   NA 142 06/02/2022   K 3.6 06/02/2022   CL 102 06/02/2022   CO2 27 06/02/2022    Lab Results  Component Value Date   CHOL 114 04/28/2022   HDL 53  04/28/2022   LDLCALC 50 04/28/2022   TRIG 43 04/28/2022   CHOLHDL 2.2 04/28/2022    Current Outpatient Medications on File Prior to Visit  Medication Sig Dispense Refill   acetaminophen (TYLENOL) 500 MG tablet Take 500 mg by mouth every 6 (six) hours as needed.     amLODipine (NORVASC) 2.5 MG tablet Take 1 tablet (2.5 mg total) by mouth daily. 90 tablet 3   Azelastine HCl 137 MCG/SPRAY SOLN Place into both nostrils.     blood glucose meter kit and supplies KIT Dispense based on patient and insurance preference. Test BS bid 1 each 0   cyclobenzaprine (FLEXERIL) 10 MG tablet Take 1 tablet (10 mg total) by mouth 3 (three) times daily as needed for muscle spasms. 30 tablet 0   dapagliflozin propanediol (FARXIGA) 5 MG TABS tablet Take 1 tablet (5 mg total) by mouth daily before breakfast. 90 tablet 3   docusate sodium (COLACE) 100 MG capsule Take 100 mg by mouth 2 (two) times daily as needed.     glimepiride (AMARYL) 2 MG tablet Take 1 tablet (2 mg total) by mouth daily before breakfast. 90 tablet 3   hydrochlorothiazide (HYDRODIURIL) 25 MG tablet Take 1 tablet (25 mg total) by mouth daily. 90 tablet 3   ibuprofen (ADVIL,MOTRIN) 600 MG tablet TAKE 1 TABLET (600 MG TOTAL) BY MOUTH EVERY 8 (EIGHT) HOURS AS NEEDED. 90 tablet 1   levocetirizine (XYZAL) 5 MG tablet Take 1 tablet (5 mg total) by mouth every evening. 90 tablet 3   losartan (COZAAR) 50 MG  tablet Take 1 tablet (50 mg total) by mouth daily. 90 tablet 3   mometasone (NASONEX) 50 MCG/ACT nasal spray Place 2 sprays into the nose daily. 17 g 1   omeprazole (PRILOSEC) 40 MG capsule Take 1 capsule (40 mg total) by mouth daily. 90 capsule 3   OneTouch Delica Lancets 33G MISC TEST TWICE A DAY 200 each 6   ONETOUCH ULTRA test strip TEST TWICE DAILY 100 strip 12   potassium chloride SA (KLOR-CON M20) 20 MEQ tablet TAKE 1 TABLET BY MOUTH EVERY DAY 90 tablet 0   simvastatin (ZOCOR) 10 MG tablet TAKE 1 TABLET BY MOUTH EVERYDAY AT BEDTIME 90 tablet 3    No current facility-administered medications on file prior to visit.     Assessment/Plan:   Diabetes: - Currently controlled - Reviewed goal A1c, goal fasting, and goal 2 hour post prandial glucose - Reviewed dietary modifications including importance of having regular well-balanced meals and snacks throughout the day, while controlling carbohydrate portion sizes - Counsel patient on s/s of low blood sugar and how to treat lows  Encourage patient to pick up glucose tablets to carry with her - Recommend to check glucose, keep log of results and have this record to review at upcoming medical appointments. Patient to contact provider office sooner if needed for readings outside of established parameters or symptoms - Initiate re-enrollment for Farxiga patient assistance program for 2025 via [employer] website, patient confirms receives text message from program and completes re-enrollment via Writer today - Will collaborate with PCP to request provider send new prescription for Farxiga to [facility] (dispensing pharmacy for [employer]) for 2025 - Patient to follow up with [employer] patient assistance program as needed for refills of Farxiga. Phone number for program, [phone], provided to patient   Follow Up Plan: Clinical Pharmacist will follow up with patient by telephone on 07/08/2023 at 8:30 AM to follow up regarding medication assistance and complete medication review  Estelle Grumbles, PharmD, [facility] [phone]

## 2023-05-04 NOTE — Progress Notes (Signed)
   Outreach Note  05/04/2023 Name: BRIDGETE ARRAZOLA MRN: [id number] DOB: Jan 09, 1951  Referred by: Reubin Milan, MD Reason for referral : Medication Assistance   An unsuccessful telephone outreach was attempted today to contact the patient about Care Management needs.    Follow Up Plan: Will collaborate with Care Guide to outreach to schedule follow up with me  Estelle Grumbles, PharmD, [facility] [phone]

## 2023-05-04 NOTE — Telephone Encounter (Deleted)
   Outreach Note  05/04/2023 Name: Kathy Dougherty MRN: [id number] DOB: Jan 09, 1951  Referred by: Reubin Milan, MD Reason for referral : Medication Assistance   An unsuccessful telephone outreach was attempted today to contact the patient about Care Management needs.    Follow Up Plan: Will collaborate with Care Guide to outreach to schedule follow up with me  Estelle Grumbles, PharmD, [facility] [phone]

## 2023-05-05 ENCOUNTER — Telehealth: Payer: Self-pay

## 2023-05-05 ENCOUNTER — Other Ambulatory Visit: Payer: Self-pay

## 2023-05-05 DIAGNOSIS — Z1211 Encounter for screening for malignant neoplasm of colon: Secondary | ICD-10-CM

## 2023-05-05 LAB — COMPREHENSIVE METABOLIC PANEL
ALT: 21 [IU]/L (ref 0–32)
AST: 22 [IU]/L (ref 0–40)
Albumin: 4.2 g/dL (ref 3.8–4.8)
Alkaline Phosphatase: 83 [IU]/L (ref 44–121)
BUN/Creatinine Ratio: 18 (ref 12–28)
BUN: 11 mg/dL (ref 8–27)
Bilirubin Total: 0.5 mg/dL (ref 0.0–1.2)
CO2: 25 mmol/L (ref 20–29)
Calcium: 9.7 mg/dL (ref 8.7–10.3)
Chloride: 101 mmol/L (ref 96–106)
Creatinine, Ser: 0.62 mg/dL (ref 0.57–1.00)
Globulin, Total: 3.2 g/dL (ref 1.5–4.5)
Glucose: 87 mg/dL (ref 70–99)
Potassium: 3.1 mmol/L — ABNORMAL LOW (ref 3.5–5.2)
Sodium: 140 mmol/L (ref 134–144)
Total Protein: 7.4 g/dL (ref 6.0–8.5)
eGFR: 95 mL/min/{1.73_m2} (ref >59)

## 2023-05-05 LAB — CBC WITH DIFFERENTIAL/PLATELET
Basophils Absolute: 0.1 10*3/uL (ref 0.0–0.2)
Basos: 1 %
EOS (ABSOLUTE): 0.1 10*3/uL (ref 0.0–0.4)
Eos: 1 %
Hematocrit: 39.6 % (ref 34.0–46.6)
Hemoglobin: 12.8 g/dL (ref 11.1–15.9)
Immature Grans (Abs): 0 10*3/uL (ref 0.0–0.1)
Immature Granulocytes: 0 %
Lymphocytes Absolute: 1.7 10*3/uL (ref 0.7–3.1)
Lymphs: 36 %
MCH: 27.6 pg (ref 26.6–33.0)
MCHC: 32.3 g/dL (ref 31.5–35.7)
MCV: 86 fL (ref 79–97)
Monocytes Absolute: 0.5 10*3/uL (ref 0.1–0.9)
Monocytes: 11 %
Neutrophils Absolute: 2.4 10*3/uL (ref 1.4–7.0)
Neutrophils: 51 %
Platelets: 219 10*3/uL (ref 150–450)
RBC: 4.63 x10E6/uL (ref 3.77–5.28)
RDW: 13 % (ref 11.7–15.4)
WBC: 4.7 10*3/uL (ref 3.4–10.8)

## 2023-05-05 LAB — LIPID PANEL
Chol/HDL Ratio: 2.5 {ratio} (ref 0.0–4.4)
Cholesterol, Total: 115 mg/dL (ref 100–199)
HDL: 46 mg/dL (ref >39)
LDL Chol Calc (NIH): 56 mg/dL (ref 0–99)
Triglycerides: 56 mg/dL (ref 0–149)
VLDL Cholesterol Cal: 13 mg/dL (ref 5–40)

## 2023-05-05 LAB — HEMOGLOBIN A1C
Est. average glucose Bld gHb Est-mCnc: 166 mg/dL
Hgb A1c MFr Bld: 7.4 % — ABNORMAL HIGH (ref 4.8–5.6)

## 2023-05-05 LAB — TSH: TSH: 1.12 u[IU]/mL (ref 0.450–4.500)

## 2023-05-05 MED ORDER — NA SULFATE-K SULFATE-MG SULF 17.5-3.13-1.6 GM/177ML PO SOLN: 1.0000 | Freq: Once | ORAL | 0 refills | Status: AC

## 2023-05-05 NOTE — Telephone Encounter (Signed)
Pt requesting call back to schedule colonoscopy.

## 2023-05-05 NOTE — Telephone Encounter (Signed)
Gastroenterology Pre-Procedure Review  Request Date: 06/02/23 Requesting Physician: Dr. Allegra Lai  PATIENT REVIEW QUESTIONS: The patient responded to the following health history questions as indicated:    1. Are you having any GI issues? no 2. Do you have a personal history of Polyps? no 3. Do you have a family history of Colon Cancer or Polyps?  Suspected brother colon cancer 4. Diabetes Mellitus? yes (takes [place] and Glimeperide stop date noted) 5. Joint replacements in the past 12 months?no 6. Major health problems in the past 3 months?no 7. Any artificial heart valves, MVP, or defibrillator?no    MEDICATIONS & ALLERGIES:    Patient reports the following regarding taking any anticoagulation/antiplatelet therapy:   Plavix, Coumadin, Eliquis, Xarelto, Lovenox, Pradaxa, Brilinta, or Effient? no Aspirin? no  Patient confirms/reports the following medications:  Current Outpatient Medications  Medication Sig Dispense Refill   acetaminophen (TYLENOL) 500 MG tablet Take 500 mg by mouth every 6 (six) hours as needed.     amLODipine (NORVASC) 2.5 MG tablet Take 1 tablet (2.5 mg total) by mouth daily. 90 tablet 3   Azelastine HCl 137 MCG/SPRAY SOLN Place into both nostrils.     blood glucose meter kit and supplies KIT Dispense based on patient and insurance preference. Test BS bid 1 each 0   cyclobenzaprine (FLEXERIL) 10 MG tablet Take 1 tablet (10 mg total) by mouth 3 (three) times daily as needed for muscle spasms. 30 tablet 0   dapagliflozin propanediol (FARXIGA) 5 MG TABS tablet Take 1 tablet (5 mg total) by mouth daily before breakfast. 90 tablet 3   docusate sodium (COLACE) 100 MG capsule Take 100 mg by mouth 2 (two) times daily as needed.     glimepiride (AMARYL) 2 MG tablet Take 1 tablet (2 mg total) by mouth daily before breakfast. 90 tablet 3   hydrochlorothiazide (HYDRODIURIL) 25 MG tablet Take 1 tablet (25 mg total) by mouth daily. 90 tablet 3   ibuprofen (ADVIL,MOTRIN) 600 MG  tablet TAKE 1 TABLET (600 MG TOTAL) BY MOUTH EVERY 8 (EIGHT) HOURS AS NEEDED. 90 tablet 1   levocetirizine (XYZAL) 5 MG tablet Take 1 tablet (5 mg total) by mouth every evening. 90 tablet 3   losartan (COZAAR) 50 MG tablet Take 1 tablet (50 mg total) by mouth daily. 90 tablet 3   mometasone (NASONEX) 50 MCG/ACT nasal spray Place 2 sprays into the nose daily. 17 g 1   omeprazole (PRILOSEC) 40 MG capsule Take 1 capsule (40 mg total) by mouth daily. 90 capsule 3   OneTouch Delica Lancets 33G MISC TEST TWICE A DAY 200 each 6   ONETOUCH ULTRA test strip TEST TWICE DAILY 100 strip 12   potassium chloride SA (KLOR-CON M20) 20 MEQ tablet TAKE 1 TABLET BY MOUTH EVERY DAY 90 tablet 0   simvastatin (ZOCOR) 10 MG tablet TAKE 1 TABLET BY MOUTH EVERYDAY AT BEDTIME 90 tablet 3   No current facility-administered medications for this visit.    Patient confirms/reports the following allergies:  Allergies  Allergen Reactions   Ace Inhibitors Cough   Metformin And Related Diarrhea   Penicillins Rash   Sulfa Antibiotics Rash    No orders of the defined types were placed in this encounter.   AUTHORIZATION INFORMATION Primary Insurance: 1D#: Group #:  Secondary Insurance: 1D#: Group #:  SCHEDULE INFORMATION: Date: 06/02/23 Time: Location: [facility]

## 2023-05-07 ENCOUNTER — Other Ambulatory Visit: Payer: Self-pay | Admitting: Internal Medicine

## 2023-05-07 DIAGNOSIS — E876 Hypokalemia: Secondary | ICD-10-CM

## 2023-05-07 DIAGNOSIS — E118 Type 2 diabetes mellitus with unspecified complications: Secondary | ICD-10-CM

## 2023-05-07 MED ORDER — DAPAGLIFLOZIN PROPANEDIOL 10 MG PO TABS: 10.0000 mg | ORAL_TABLET | Freq: Every day | ORAL | 3 refills | Status: AC

## 2023-05-07 MED ORDER — POTASSIUM CHLORIDE CRYS ER 20 MEQ PO TBCR: 40.0000 meq | EXTENDED_RELEASE_TABLET | Freq: Every day | ORAL | 3 refills | Status: DC

## 2023-05-08 DIAGNOSIS — Z1231 Encounter for screening mammogram for malignant neoplasm of breast: Secondary | ICD-10-CM | POA: Diagnosis not present

## 2023-05-08 LAB — HM MAMMOGRAPHY

## 2023-05-12 ENCOUNTER — Encounter: Payer: Self-pay | Admitting: Internal Medicine

## 2023-05-27 ENCOUNTER — Telehealth: Payer: Self-pay | Admitting: Internal Medicine

## 2023-05-27 ENCOUNTER — Encounter: Payer: Self-pay | Admitting: Internal Medicine

## 2023-05-27 ENCOUNTER — Ambulatory Visit: Payer: Medicare HMO | Admitting: Internal Medicine

## 2023-05-27 ENCOUNTER — Ambulatory Visit: Payer: Medicare HMO | Admitting: Physician Assistant

## 2023-05-27 NOTE — Telephone Encounter (Signed)
 Patient has concerns about her sugar being up too high. Patient wanted Dr. Judithann Graves to know, and that she has to stop her medications 3 days before procedure.. patient would like a call back.

## 2023-05-27 NOTE — Telephone Encounter (Signed)
 noted

## 2023-05-28 ENCOUNTER — Encounter: Payer: Self-pay | Admitting: Gastroenterology

## 2023-05-28 ENCOUNTER — Ambulatory Visit (INDEPENDENT_AMBULATORY_CARE_PROVIDER_SITE_OTHER): Payer: Medicare HMO | Admitting: Emergency Medicine

## 2023-05-28 ENCOUNTER — Ambulatory Visit (INDEPENDENT_AMBULATORY_CARE_PROVIDER_SITE_OTHER): Payer: Medicare HMO | Admitting: Physician Assistant

## 2023-05-28 ENCOUNTER — Encounter: Payer: Self-pay | Admitting: Physician Assistant

## 2023-05-28 VITALS — Ht 62.0 in | Wt 171.0 lb

## 2023-05-28 VITALS — BP 130/78 | HR 76 | Temp 97.9°F | Ht 62.0 in | Wt 167.0 lb

## 2023-05-28 DIAGNOSIS — Z0000 Encounter for general adult medical examination without abnormal findings: Secondary | ICD-10-CM | POA: Diagnosis not present

## 2023-05-28 DIAGNOSIS — J069 Acute upper respiratory infection, unspecified: Secondary | ICD-10-CM | POA: Diagnosis not present

## 2023-05-28 DIAGNOSIS — Z780 Asymptomatic menopausal state: Secondary | ICD-10-CM | POA: Diagnosis not present

## 2023-05-28 DIAGNOSIS — E118 Type 2 diabetes mellitus with unspecified complications: Secondary | ICD-10-CM

## 2023-05-28 NOTE — Progress Notes (Addendum)
 Date:  05/28/2023   Name:  Kathy Dougherty   DOB:  1951-03-25   MRN:  [id number]   Chief Complaint: Cough  Cough This is a new problem. Episode onset: X1 week. The problem has been unchanged. The cough is Non-productive. Associated symptoms include nasal congestion and rhinorrhea. She has tried OTC cough suppressant for the symptoms. The treatment provided mild relief.   Kathy Dougherty presents new to me today for evaluation of URI symptoms as described above, fluctuating over the last 1 week.  No fever, cough sometimes wakes her from sleep, not productive.  Stable over the last 48 hours.  Using regular guaifenesin at home, has some cough syrup from a previous prescription that she has not yet tried yet.  Wants to make sure she is okay for her upcoming colonoscopy 06/02/2023.  Review of Systems  HENT:  Positive for rhinorrhea.   Respiratory:  Positive for cough.      Lab Results  Component Value Date   NA 140 05/04/2023   K 3.1 (L) 05/04/2023   CO2 25 05/04/2023   GLUCOSE 87 05/04/2023   BUN 11 05/04/2023   CREATININE 0.62 05/04/2023   CALCIUM 9.7 05/04/2023   EGFR 95 05/04/2023   GFRNONAA >60 01/07/2022   Lab Results  Component Value Date   CHOL 115 05/04/2023   HDL 46 05/04/2023   LDLCALC 56 05/04/2023   TRIG 56 05/04/2023   CHOLHDL 2.5 05/04/2023   Lab Results  Component Value Date   TSH 1.120 05/04/2023   Lab Results  Component Value Date   HGBA1C 7.4 (H) 05/04/2023   Lab Results  Component Value Date   WBC 4.7 05/04/2023   HGB 12.8 05/04/2023   HCT 39.6 05/04/2023   MCV 86 05/04/2023   PLT 219 05/04/2023   Lab Results  Component Value Date   ALT 21 05/04/2023   AST 22 05/04/2023   ALKPHOS 83 05/04/2023   BILITOT 0.5 05/04/2023   No results found for: MARIEN BOLLS, VD25OH   Patient Active Problem List   Diagnosis Date Noted   Hypokalemia 04/29/2022   Hypercalcemia 01/07/2022   Aortic arch atherosclerosis (HCC) 07/02/2021   Deafness in  right ear 04/18/2020   Lipoma of lateral chest wall 04/18/2020   Porokeratosis 08/05/2019   Hyperlipidemia associated with type 2 diabetes mellitus (HCC) 03/26/2017   Iritis of left eye 03/26/2017   Pre-ulcerative calluses 03/26/2017   Arthritis of right knee 01/15/2016   DM (diabetes mellitus), type 2 with complications (HCC) 03/15/2015   Abnormal LFTs 03/15/2015   Essential (primary) hypertension 03/15/2015   Gastro-esophageal reflux disease without esophagitis 03/15/2015   Allergic rhinitis, seasonal 03/15/2015    Allergies  Allergen Reactions   Ace Inhibitors Cough   Metformin  And Related Diarrhea   Penicillins Rash   Sulfa Antibiotics Rash    Past Surgical History:  Procedure Laterality Date   CATARACT EXTRACTION Right 08/2014   CATARACT EXTRACTION Left 04/2016   HAMMER TOE SURGERY     PARTIAL HYSTERECTOMY     fibroids    Social History   Tobacco Use   Smoking status: Never   Smokeless tobacco: Never  Vaping Use   Vaping status: Never Used  Substance Use Topics   Alcohol use: No    Alcohol/week: 0.0 standard drinks of alcohol   Drug use: No     Medication list has been reviewed and updated.  Current Meds  Medication Sig   acetaminophen (TYLENOL) 500 MG tablet Take 500  mg by mouth every 6 (six) hours as needed.   amLODipine  (NORVASC ) 2.5 MG tablet Take 1 tablet (2.5 mg total) by mouth daily.   Azelastine  HCl 137 MCG/SPRAY SOLN Place into both nostrils.   blood glucose meter kit and supplies KIT Dispense based on patient and insurance preference. Test BS bid   cyclobenzaprine  (FLEXERIL ) 10 MG tablet Take 1 tablet (10 mg total) by mouth 3 (three) times daily as needed for muscle spasms.   dapagliflozin  propanediol (FARXIGA ) 10 MG TABS tablet Take 1 tablet (10 mg total) by mouth daily before breakfast.   docusate sodium (COLACE) 100 MG capsule Take 100 mg by mouth 2 (two) times daily as needed.   glimepiride  (AMARYL ) 2 MG tablet Take 1 tablet (2 mg total) by  mouth daily before breakfast.   hydrochlorothiazide  (HYDRODIURIL ) 25 MG tablet Take 1 tablet (25 mg total) by mouth daily.   ibuprofen  (ADVIL ,MOTRIN ) 600 MG tablet TAKE 1 TABLET (600 MG TOTAL) BY MOUTH EVERY 8 (EIGHT) HOURS AS NEEDED.   levocetirizine (XYZAL ) 5 MG tablet Take 1 tablet (5 mg total) by mouth every evening.   losartan  (COZAAR ) 50 MG tablet Take 1 tablet (50 mg total) by mouth daily.   mometasone  (NASONEX ) 50 MCG/ACT nasal spray Place 2 sprays into the nose daily.   omeprazole  (PRILOSEC) 40 MG capsule Take 1 capsule (40 mg total) by mouth daily.   OneTouch Delica Lancets 33G MISC TEST TWICE A DAY   ONETOUCH ULTRA test strip TEST TWICE DAILY   potassium chloride  SA (KLOR-CON  M20) 20 MEQ tablet Take 2 tablets (40 mEq total) by mouth daily.   simvastatin  (ZOCOR ) 10 MG tablet TAKE 1 TABLET BY MOUTH EVERYDAY AT BEDTIME       05/01/2023    9:39 AM 03/27/2023    2:50 PM 02/10/2023    2:05 PM 12/31/2022    1:20 PM  GAD 7 : Generalized Anxiety Score  Nervous, Anxious, on Edge 0 0 0 0  Control/stop worrying 0 0 0 0  Worry too much - different things 0 0 0 0  Trouble relaxing 0 0 1 1  Restless 0 0 0 0  Easily annoyed or irritable 0 0 0 1  Afraid - awful might happen 0 0 0 0  Total GAD 7 Score 0 0 1 2  Anxiety Difficulty Not difficult at all Not difficult at all Not difficult at all Not difficult at all       05/28/2023   10:46 AM 05/01/2023    9:39 AM 03/27/2023    2:50 PM  Depression screen PHQ 2/9  Decreased Interest 0 0 0  Down, Depressed, Hopeless 0 0 0  PHQ - 2 Score 0 0 0  Altered sleeping 0 2 0  Tired, decreased energy 0 2 0  Change in appetite 0 0 0  Feeling bad or failure about yourself  0 0 0  Trouble concentrating 0 0 0  Moving slowly or fidgety/restless 0 0 0  Suicidal thoughts 0 0 0  PHQ-9 Score 0 4 0  Difficult doing work/chores Not difficult at all Not difficult at all Not difficult at all    BP Readings from Last 3 Encounters:  05/28/23 130/78   05/01/23 122/74  03/27/23 104/78    Physical Exam Vitals and nursing note reviewed.  Constitutional:      General: She is not in acute distress.    Appearance: Normal appearance.  HENT:     Right Ear: Tympanic membrane normal.  Left Ear: Tympanic membrane normal.     Ears:     Comments: EAC clear bilaterally with good view of TM which is without effusion or erythema.     Nose: Nose normal.     Comments: Sinuses nontender    Mouth/Throat:     Mouth: Mucous membranes are moist.     Pharynx: No oropharyngeal exudate or posterior oropharyngeal erythema.  Eyes:     Conjunctiva/sclera: Conjunctivae normal.     Pupils: Pupils are equal, round, and reactive to light.  Cardiovascular:     Rate and Rhythm: Normal rate and regular rhythm.     Heart sounds: No murmur heard.    No friction rub. No gallop.  Pulmonary:     Effort: Pulmonary effort is normal.     Breath sounds: Normal breath sounds. No wheezing, rhonchi or rales.  Lymphadenopathy:     Cervical: No cervical adenopathy.     Wt Readings from Last 3 Encounters:  05/28/23 167 lb (75.8 kg)  05/28/23 171 lb (77.6 kg)  05/01/23 171 lb (77.6 kg)    BP 130/78   Pulse 76   Temp 97.9 F (36.6 C)   Ht 5' 2 (1.575 m)   Wt 167 lb (75.8 kg)   SpO2 97%   BMI 30.54 kg/m   Assessment and Plan:  Problem List Items Addressed This Visit   None Visit Diagnoses       Acute URI    -  Primary      Mild. Normal exam. Likely viral etiology. Discussed self-limited nature of viral illnesses and advised conservative measures including rest, fluids, honey, and OTC cough/cold medications.   Contact precautions advised to limit spread. Encouraged mask wearing and good hand hygiene especially before meals. Call if acutely worsening symptoms or if no improvement after Day 7 of illness   *AWV also completed this morning at 10:40*  Return if symptoms worsen or fail to improve.    Rolan Hoyle, PA-C, DMSc, Nutritionist [facility] [phone]

## 2023-05-28 NOTE — Patient Instructions (Addendum)
 -  It was a pleasure to see you today! Please review your visit summary for helpful information -I would encourage you to follow your care via MyChart where you can access lab results, notes, messages, and more -If you feel that we did a nice job today, please complete your after-visit survey and leave [place] a Google review! Your CMA today was [place] and your provider was Alvester Morin, PA-C, DMSc

## 2023-05-28 NOTE — Anesthesia Preprocedure Evaluation (Addendum)
 Anesthesia Evaluation  Patient identified by MRN, date of birth, ID band Patient awake    Reviewed: Allergy & Precautions, NPO status , Patient's Chart, lab work & pertinent test results  Airway Mallampati: III  TM Distance: >3 FB Neck ROM: full    Dental  (+) Chipped, Dental Advidsory Given   Pulmonary neg pulmonary ROS   Pulmonary exam normal        Cardiovascular hypertension, negative cardio ROS Normal cardiovascular exam     Neuro/Psych negative neurological ROS  negative psych ROS   GI/Hepatic Neg liver ROS,GERD  Medicated and Controlled,,  Endo/Other  negative endocrine ROSdiabetes    Renal/GU negative Renal ROS  negative genitourinary   Musculoskeletal   Abdominal   Peds  Hematology negative hematology ROS (+)   Anesthesia Other Findings Needs BMP day of surgery  Diabetes mellitus without complication Hypertension GERD (gastroesophageal reflux disease) Seasonal allergies Hyperlipidemia     Reproductive/Obstetrics negative OB ROS                             Anesthesia Physical Anesthesia Plan  ASA: 2  Anesthesia Plan: General   Post-op Pain Management: Minimal or no pain anticipated   Induction: Intravenous  PONV Risk Score and Plan: 3 and Propofol  infusion, TIVA and Ondansetron  Airway Management Planned: Nasal Cannula  Additional Equipment: None  Intra-op Plan:   Post-operative Plan:   Informed Consent: I have reviewed the patients History and Physical, chart, labs and discussed the procedure including the risks, benefits and alternatives for the proposed anesthesia with the patient or authorized representative who has indicated his/her understanding and acceptance.     Dental advisory given  Plan Discussed with: CRNA and Surgeon  Anesthesia Plan Comments: (Discussed risks of anesthesia with patient, including possibility of difficulty with spontaneous  ventilation under anesthesia necessitating airway intervention, PONV, and rare risks such as cardiac or respiratory or neurological events, and allergic reactions. Discussed the role of CRNA in patient's perioperative care. Patient understands.)        Anesthesia Quick Evaluation

## 2023-05-28 NOTE — Progress Notes (Signed)
 Subjective:   Kathy Dougherty is a 73 y.o. female who presents for Medicare Annual (Subsequent) preventive examination.  Visit Complete: Virtual I connected with  Kathy Dougherty on 05/28/23 by a video and audio enabled telemedicine application and verified that I am speaking with the correct person using two identifiers.  Patient Location: Home  Provider Location: Home Office  I discussed the limitations of evaluation and management by telemedicine. The patient expressed understanding and agreed to proceed.  Vital Signs: Because this visit was a virtual/telehealth visit, some criteria may be missing or patient reported. Any vitals not documented were not able to be obtained and vitals that have been documented are patient reported.   Cardiac Risk Factors include: advanced age (>21men, >66 women);hypertension;diabetes mellitus;dyslipidemia;obesity (BMI >30kg/m2)     Objective:    Today's Vitals   05/28/23 1033  Weight: 171 lb (77.6 kg)  Height: 5' 2 (1.575 m)   Body mass index is 31.28 kg/m.     05/28/2023   10:48 AM 05/02/2022   10:13 AM 06/28/2021    9:15 AM 05/01/2021    9:30 AM 05/28/2020    1:45 PM 04/30/2020    9:04 AM 04/25/2019    9:05 AM  Advanced Directives  Does Patient Have a Medical Advance Directive? No No No No Yes No No  Type of Advance Directive     Living will    Does patient want to make changes to medical advance directive?     No - Patient declined    Would patient like information on creating a medical advance directive? Yes (MAU/Ambulatory/Procedural Areas - Information given) No - Patient declined No - Patient declined Yes (MAU/Ambulatory/Procedural Areas - Information given) No - Patient declined No - Patient declined Yes (MAU/Ambulatory/Procedural Areas - Information given)    Current Medications (verified) Outpatient Encounter Medications as of 05/28/2023  Medication Sig   acetaminophen (TYLENOL) 500 MG tablet Take 500 mg by mouth every 6 (six)  hours as needed.   amLODipine  (NORVASC ) 2.5 MG tablet Take 1 tablet (2.5 mg total) by mouth daily.   Azelastine  HCl 137 MCG/SPRAY SOLN Place into both nostrils.   blood glucose meter kit and supplies KIT Dispense based on patient and insurance preference. Test BS bid   dapagliflozin  propanediol (FARXIGA ) 10 MG TABS tablet Take 1 tablet (10 mg total) by mouth daily before breakfast.   docusate sodium (COLACE) 100 MG capsule Take 100 mg by mouth 2 (two) times daily as needed.   glimepiride  (AMARYL ) 2 MG tablet Take 1 tablet (2 mg total) by mouth daily before breakfast.   hydrochlorothiazide  (HYDRODIURIL ) 25 MG tablet Take 1 tablet (25 mg total) by mouth daily.   ibuprofen  (ADVIL ,MOTRIN ) 600 MG tablet TAKE 1 TABLET (600 MG TOTAL) BY MOUTH EVERY 8 (EIGHT) HOURS AS NEEDED.   levocetirizine (XYZAL ) 5 MG tablet Take 1 tablet (5 mg total) by mouth every evening.   losartan  (COZAAR ) 50 MG tablet Take 1 tablet (50 mg total) by mouth daily.   mometasone  (NASONEX ) 50 MCG/ACT nasal spray Place 2 sprays into the nose daily.   omeprazole  (PRILOSEC) 40 MG capsule Take 1 capsule (40 mg total) by mouth daily.   OneTouch Delica Lancets 33G MISC TEST TWICE A DAY   ONETOUCH ULTRA test strip TEST TWICE DAILY   potassium chloride  SA (KLOR-CON  M20) 20 MEQ tablet Take 2 tablets (40 mEq total) by mouth daily.   simvastatin  (ZOCOR ) 10 MG tablet TAKE 1 TABLET BY MOUTH EVERYDAY AT BEDTIME  cyclobenzaprine  (FLEXERIL ) 10 MG tablet Take 1 tablet (10 mg total) by mouth 3 (three) times daily as needed for muscle spasms. (Patient not taking: Reported on 05/28/2023)   No facility-administered encounter medications on file as of 05/28/2023.    Allergies (verified) Ace inhibitors, Metformin  and related, Penicillins, and Sulfa antibiotics   History: Past Medical History:  Diagnosis Date   Diabetes mellitus without complication (HCC)    GERD (gastroesophageal reflux disease)    Hyperlipidemia    Hypertension    Seasonal  allergies    Past Surgical History:  Procedure Laterality Date   CATARACT EXTRACTION Right 08/2014   CATARACT EXTRACTION Left 04/2016   HAMMER TOE SURGERY     PARTIAL HYSTERECTOMY     fibroids   Family History  Problem Relation Age of Onset   Diabetes Mother    Congestive Heart Failure Mother    Other Father        unknown medical history   Diabetes Sister    Cancer Brother    Cancer Brother    Heart disease Brother    Sudden death Brother    Heart disease Brother    Diabetes Brother    Diabetes Brother    Social History   Socioeconomic History   Marital status: Divorced    Spouse name: Not on file   Number of children: 0   Years of education: Not on file   Highest education level: Some college, no degree  Occupational History   Occupation: retired  Tobacco Use   Smoking status: Never   Smokeless tobacco: Never  Vaping Use   Vaping status: Never Used  Substance and Sexual Activity   Alcohol use: No    Alcohol/week: 0.0 standard drinks of alcohol   Drug use: No   Sexual activity: Not Currently  Other Topics Concern   Not on file  Social History Narrative   Pt lives alone.    Social Drivers of Corporate Investment Banker Strain: Low Risk  (05/28/2023)   Overall Financial Resource Strain (CARDIA)    Difficulty of Paying Living Expenses: Not very hard  Food Insecurity: No Food Insecurity (05/28/2023)   Hunger Vital Sign    Worried About Running Out of Food in the Last Year: Never true    Ran Out of Food in the Last Year: Never true  Transportation Needs: No Transportation Needs (05/28/2023)   PRAPARE - Administrator, Civil Service (Medical): No    Lack of Transportation (Non-Medical): No  Physical Activity: Inactive (05/28/2023)   Exercise Vital Sign    Days of Exercise per Week: 0 days    Minutes of Exercise per Session: 0 min  Stress: No Stress Concern Present (05/28/2023)   [employer] of Occupational Health - Occupational Stress  Questionnaire    Feeling of Stress : Not at all  Social Connections: Moderately Isolated (05/28/2023)   Social Connection and Isolation Panel [NHANES]    Frequency of Communication with Friends and Family: More than three times a week    Frequency of Social Gatherings with Friends and Family: More than three times a week    Attends Religious Services: More than 4 times per year    Active Member of [employer] or Organizations: No    Attends Banker Meetings: Never    Marital Status: Never married    Tobacco Counseling Counseling given: Not Answered   Clinical Intake:  Pre-visit preparation completed: Yes  Pain : No/denies pain  BMI - recorded: 31.28 Nutritional Status: BMI > 30  Obese Nutritional Risks: None Diabetes: Yes CBG done?: No (FBS 144 per patient) Did pt. bring in CBG monitor from home?: No  How often do you need to have someone help you when you read instructions, pamphlets, or other written materials from your doctor or pharmacy?: 1 - Never  Interpreter Needed?: No  Information entered by :: Vina Ned, CMA   Activities of Daily Living    05/28/2023   10:38 AM  In your present state of health, do you have any difficulty performing the following activities:  Hearing? 0  Vision? 0  Difficulty concentrating or making decisions? 0  Walking or climbing stairs? 0  Dressing or bathing? 0  Doing errands, shopping? 0  Preparing Food and eating ? N  Using the Toilet? N  In the past six months, have you accidently leaked urine? N  Do you have problems with loss of bowel control? N  Managing your Medications? N  Managing your Finances? N  Housekeeping or managing your Housekeeping? N    Patient Care Team: Justus Leita DEL, MD as PCP - General (Internal Medicine) Oneil Angle (Optometry) [place], [place] DASEN, [place] as Consulting Physician (Podiatry) Alana, Sharyle LABOR, RPH-CPP as Pharmacist  Indicate any recent Medical Services you may have received from  other than [facility] providers in the past year (date may be approximate).     Assessment:   This is a routine wellness examination for [place].  Hearing/Vision screen Hearing Screening - Comments:: No hearing loss Vision Screening - Comments:: Gets eye exams   Goals Addressed               This Visit's Progress     Patient Stated (pt-stated)        Attend diabetes and nutrition education and get more active      Depression Screen    05/28/2023   10:46 AM 05/01/2023    9:39 AM 03/27/2023    2:50 PM 02/10/2023    2:05 PM 12/31/2022    1:19 PM 09/19/2022    4:03 PM 08/28/2022    9:32 AM  PHQ 2/9 Scores  PHQ - 2 Score 0 0 0 0 2 0 0  PHQ- 9 Score 0 4 0 3 5 4 3     Fall Risk    05/28/2023   10:50 AM 05/01/2023    9:39 AM 03/27/2023    2:51 PM 02/10/2023    2:05 PM 12/31/2022    1:20 PM  Fall Risk   Falls in the past year? 0 0 0 0 0  Number falls in past yr: 0 0 0 0 0  Injury with Fall? 0 0 0 0 0  Risk for fall due to : No Fall Risks No Fall Risks No Fall Risks No Fall Risks   Follow up Falls prevention discussed Falls evaluation completed Falls evaluation completed Falls evaluation completed     MEDICARE RISK AT HOME: Medicare Risk at Home Any stairs in or around the home?: No If so, are there any without handrails?: No Home free of loose throw rugs in walkways, pet beds, electrical cords, etc?: Yes Adequate lighting in your home to reduce risk of falls?: Yes Life alert?: No Use of a cane, walker or w/c?: No Grab bars in the bathroom?: Yes Shower chair or bench in shower?: No Elevated toilet seat or a handicapped toilet?: No  TIMED UP AND GO:  Was the test performed?  No  Cognitive Function:        05/28/2023   10:51 AM 05/02/2022    9:47 AM 04/30/2020    9:05 AM 04/25/2019    9:12 AM 03/23/2017    9:19 AM  6CIT Screen  What Year? 0 points 0 points 0 points 0 points 0 points  What month? 0 points 0 points 0 points 0 points 0 points  What time? 0 points 0 points  0 points 0 points 0 points  Count back from 20 0 points 0 points 0 points 0 points 0 points  Months in reverse 0 points 0 points 0 points 0 points 0 points  Repeat phrase 0 points 0 points 0 points 0 points 2 points  Total Score 0 points 0 points 0 points 0 points 2 points    Immunizations Immunization History  Administered Date(s) Administered   Fluad Quad(high Dose 65+) 02/18/2019, 03/13/2020, 01/28/2021, 03/12/2022   Fluad Trivalent(High Dose 65+) 01/20/2023   Influenza, High Dose Seasonal PF 03/23/2017, 03/22/2018   Influenza,inj,Quad PF,6+ Mos 03/19/2015, 03/20/2016   Moderna Covid-19 Vaccine Bivalent Booster 38yrs & up 03/01/2021   Moderna Sars-Covid-2 Vaccination 06/24/2019, 07/26/2019, 03/19/2020   Pneumococcal Conjugate-13 03/20/2016   Pneumococcal Polysaccharide-23 03/23/2017   Zoster, Live 11/05/2012    TDAP status: Due, Education has been provided regarding the importance of this vaccine. Advised may receive this vaccine at local pharmacy or Health Dept. Aware to provide a copy of the vaccination record if obtained from local pharmacy or Health Dept. Verbalized acceptance and understanding.  Flu Vaccine status: Up to date  Pneumococcal vaccine status: Up to date  Covid-19 vaccine status: Information provided on how to obtain vaccines.   Qualifies for Shingles Vaccine? Yes   Zostavax completed Yes   Shingrix Completed?: No.    Education has been provided regarding the importance of this vaccine. Patient has been advised to call insurance company to determine out of pocket expense if they have not yet received this vaccine. Advised may also receive vaccine at local pharmacy or Health Dept. Verbalized acceptance and understanding.  Screening Tests Health Maintenance  Topic Date Due   DTaP/Tdap/Td (1 - Tdap) Never done   Zoster Vaccines- Shingrix (1 of 2) 02/09/1970   Colonoscopy  12/22/2022   COVID-19 Vaccine (5 - 2024-25 season) 01/18/2023   Diabetic kidney  evaluation - Urine ACR  08/28/2023   HEMOGLOBIN A1C  11/02/2023   FOOT EXAM  12/31/2023   OPHTHALMOLOGY EXAM  02/24/2024   Diabetic kidney evaluation - eGFR measurement  05/03/2024   MAMMOGRAM  05/07/2024   Medicare Annual Wellness (AWV)  05/27/2024   Pneumonia Vaccine 55+ Years old  Completed   INFLUENZA VACCINE  Completed   DEXA SCAN  Completed   Hepatitis C Screening  Addressed   HPV VACCINES  Aged Out    Health Maintenance  Health Maintenance Due  Topic Date Due   DTaP/Tdap/Td (1 - Tdap) Never done   Zoster Vaccines- Shingrix (1 of 2) 02/09/1970   Colonoscopy  12/22/2022   COVID-19 Vaccine (5 - 2024-25 season) 01/18/2023    Colorectal cancer screening: Type of screening: Colonoscopy. Completed 12/21/12. Repeat every 10 years. Scheduled 06/02/23  Mammogram status: Completed 05/08/23. Repeat every year  Bone Density status: Ordered 05/28/23. Pt provided with contact info and advised to call to schedule appt.  Lung Cancer Screening: (Low Dose CT Chest recommended if Age 75-80 years, 20 pack-year currently smoking OR have quit w/in 15years.) does not qualify.   Lung Cancer Screening Referral: n/a  Additional Screening:  Hepatitis C Screening: does not qualify; Completed 12/01/12  Vision Screening: Recommended annual ophthalmology exams for early detection of glaucoma and other disorders of the eye. Is the patient up to date with their annual eye exam?  Yes  Who is the provider or what is the name of the office in which the patient attends annual eye exams? [facility] If pt is not established with a provider, would they like to be referred to a provider to establish care? No .   Dental Screening: Recommended annual dental exams for proper oral hygiene  Diabetic Foot Exam: Diabetic Foot Exam: Completed 12/31/22  Community Resource Referral / Chronic Care Management: CRR required this visit?  No   CCM required this visit?  No     Plan:     I have  personally reviewed and noted the following in the patient's chart:   Medical and social history Use of alcohol, tobacco or illicit drugs  Current medications and supplements including opioid prescriptions. Patient is not currently taking opioid prescriptions. Functional ability and status Nutritional status Physical activity Advanced directives List of other physicians Hospitalizations, surgeries, and ER visits in previous 12 months Vitals Screenings to include cognitive, depression, and falls Referrals and appointments  In addition, I have reviewed and discussed with patient certain preventive protocols, quality metrics, and best practice recommendations. A written personalized care plan for preventive services as well as general preventive health recommendations were provided to patient.     Vina Ned, CMA   05/28/2023   After Visit Summary: (MyChart) Due to this being a telephonic visit, the after visit summary with patients personalized plan was offered to patient via MyChart   Nurse Notes:  Placed referral to DM & Nutrition education Placed order for a DEXA scan Needs Tdap, covid, and shingles vaccines Patient has colonoscopy scheduled for 06/02/23.

## 2023-05-28 NOTE — Patient Instructions (Addendum)
 [name] , Thank you for taking time to come for your Medicare Wellness Visit. I appreciate your ongoing commitment to your health goals. Please review the following plan we discussed and let me know if I can assist you in the future.   Referrals/Orders/Follow-Ups/Clinician Recommendations: I have placed an order for a bone density test. Call [place] @ [phone] to schedule at your convenience. I have placed a referral to diabetic & nutrition education. Someone will call you for an appointment. Get the tetanus, covid and shingles vaccines at your convenience.  This is a list of the screening recommended for you and due dates:  Health Maintenance  Topic Date Due   DTaP/Tdap/Td vaccine (1 - Tdap) Never done   Zoster (Shingles) Vaccine (1 of 2) 02/09/1970   DEXA scan (bone density measurement)  08/19/2022   Colon Cancer Screening  12/22/2022   COVID-19 Vaccine (5 - 2024-25 season) 01/18/2023   Yearly kidney health urinalysis for diabetes  08/28/2023   Hemoglobin A1C  11/02/2023   Complete foot exam   12/31/2023   Eye exam for diabetics  02/24/2024   Yearly kidney function blood test for diabetes  05/03/2024   Mammogram  05/07/2024   Medicare Annual Wellness Visit  05/27/2024   Pneumonia Vaccine  Completed   Flu Shot  Completed   Hepatitis C Screening  Addressed   HPV Vaccine  Aged Out    Advanced directives: (ACP Link)Information on Advanced Care Planning can be found at [place]  Secretary of State Advance Health Care Directives Advance Health Care Directives ([email or web address]   Once you have completed the forms, please bring a copy of your health care power of attorney and living will to the office to be added to your chart at your convenience.   Next Medicare Annual Wellness Visit scheduled for next year: Yes, 06/09/24 @ 10:40am (video visit)

## 2023-05-28 NOTE — Addendum Note (Signed)
 Addended by: Remo Lipps on: 05/28/2023 03:33 PM   Modules accepted: Level of Service

## 2023-05-29 ENCOUNTER — Telehealth: Payer: Self-pay

## 2023-05-29 NOTE — Telephone Encounter (Signed)
 Returned phone call to patient.  LVM for her to call back. She stated that she has questions about her colonoscopy.  Thanks,  [place], [place]

## 2023-06-02 ENCOUNTER — Ambulatory Visit: Payer: Self-pay | Admitting: Anesthesiology

## 2023-06-02 ENCOUNTER — Encounter: Payer: Self-pay | Admitting: Gastroenterology

## 2023-06-02 ENCOUNTER — Ambulatory Visit
Admission: RE | Admit: 2023-06-02 | Discharge: 2023-06-02 | Disposition: A | Discharge status: Home / Self Care | Payer: Medicare HMO | Attending: Gastroenterology | Admitting: Gastroenterology

## 2023-06-02 ENCOUNTER — Encounter
Admission: RE | Disposition: A | Discharge status: Home / Self Care | Payer: Self-pay | Source: Home / Self Care | Attending: Gastroenterology

## 2023-06-02 ENCOUNTER — Other Ambulatory Visit: Payer: Self-pay

## 2023-06-02 DIAGNOSIS — D175 Benign lipomatous neoplasm of intra-abdominal organs: Secondary | ICD-10-CM | POA: Insufficient documentation

## 2023-06-02 DIAGNOSIS — K635 Polyp of colon: Secondary | ICD-10-CM | POA: Diagnosis not present

## 2023-06-02 DIAGNOSIS — Z1211 Encounter for screening for malignant neoplasm of colon: Secondary | ICD-10-CM | POA: Diagnosis not present

## 2023-06-02 DIAGNOSIS — I10 Essential (primary) hypertension: Secondary | ICD-10-CM | POA: Diagnosis not present

## 2023-06-02 DIAGNOSIS — Z79899 Other long term (current) drug therapy: Secondary | ICD-10-CM

## 2023-06-02 DIAGNOSIS — E119 Type 2 diabetes mellitus without complications: Secondary | ICD-10-CM | POA: Insufficient documentation

## 2023-06-02 DIAGNOSIS — E876 Hypokalemia: Secondary | ICD-10-CM

## 2023-06-02 DIAGNOSIS — Z01812 Encounter for preprocedural laboratory examination: Secondary | ICD-10-CM

## 2023-06-02 DIAGNOSIS — Z7984 Long term (current) use of oral hypoglycemic drugs: Secondary | ICD-10-CM | POA: Insufficient documentation

## 2023-06-02 DIAGNOSIS — K219 Gastro-esophageal reflux disease without esophagitis: Secondary | ICD-10-CM | POA: Diagnosis not present

## 2023-06-02 HISTORY — PX: COLONOSCOPY WITH PROPOFOL: SHX5780

## 2023-06-02 HISTORY — PX: POLYPECTOMY: SHX5525

## 2023-06-02 HISTORY — DX: Unspecified hearing loss, right ear: H91.91

## 2023-06-02 LAB — BASIC METABOLIC PANEL
Anion gap: 10 (ref 5–15)
BUN: 14 mg/dL (ref 8–23)
CO2: 24 mmol/L (ref 22–32)
Calcium: 9.3 mg/dL (ref 8.9–10.3)
Chloride: 104 mmol/L (ref 98–111)
Creatinine, Ser: 0.65 mg/dL (ref 0.44–1.00)
GFR, Estimated: >60 mL/min (ref >60)
Glucose, Bld: 91 mg/dL (ref 70–99)
Potassium: 3.2 mmol/L — ABNORMAL LOW (ref 3.5–5.1)
Sodium: 138 mmol/L (ref 135–145)

## 2023-06-02 LAB — GLUCOSE, CAPILLARY: Glucose-Capillary: 78 mg/dL (ref 70–99)

## 2023-06-02 SURGERY — COLONOSCOPY WITH PROPOFOL
Anesthesia: General | Site: Rectum

## 2023-06-02 MED ORDER — PROPOFOL 10 MG/ML IV BOLUS
INTRAVENOUS | Status: DC | PRN
  Administered 2023-06-02: 20 mg via INTRAVENOUS
  Administered 2023-06-02: 40 mg via INTRAVENOUS
  Administered 2023-06-02: 20 mg via INTRAVENOUS
  Administered 2023-06-02: 50 mg via INTRAVENOUS
  Administered 2023-06-02: 20 mg via INTRAVENOUS
  Administered 2023-06-02 (×2): 30 mg via INTRAVENOUS
  Administered 2023-06-02: 20 mg via INTRAVENOUS

## 2023-06-02 MED ORDER — STERILE WATER FOR IRRIGATION IR SOLN
Status: DC | PRN
  Administered 2023-06-02: 50 mL

## 2023-06-02 MED ORDER — SODIUM CHLORIDE 0.9 % IV SOLN: INTRAVENOUS | Status: DC

## 2023-06-02 MED ORDER — LIDOCAINE HCL (CARDIAC) PF 100 MG/5ML IV SOSY
PREFILLED_SYRINGE | INTRAVENOUS | Status: DC | PRN
  Administered 2023-06-02: 30 mg via INTRATRACHEAL

## 2023-06-02 MED ORDER — STERILE WATER FOR IRRIGATION IR SOLN
Status: DC | PRN
  Administered 2023-06-02: .05 mL

## 2023-06-02 MED ORDER — SODIUM CHLORIDE 0.9 % IV SOLN: INTRAVENOUS | Status: DC | PRN

## 2023-06-02 SURGICAL SUPPLY — 6 items
GOWN CVR UNV OPN BCK APRN NK (MISCELLANEOUS) ×4 IMPLANT
KIT PRC NS LF DISP ENDO (KITS) ×2 IMPLANT
MANIFOLD NEPTUNE II (INSTRUMENTS) ×2 IMPLANT
SNARE COLD EXACTO (MISCELLANEOUS) IMPLANT
TRAP ETRAP POLY (MISCELLANEOUS) IMPLANT
WATER STERILE IRR 250ML POUR (IV SOLUTION) ×2 IMPLANT

## 2023-06-02 NOTE — Op Note (Signed)
 [facility] Gastroenterology Patient Name: Kathy Dougherty Procedure Date: 06/02/2023 9:18 AM MRN: [id number] Account #: [id number] Date of Birth: 02-26-1951 Admit Type: Outpatient Age: 73 Room: [facility] OR ROOM 01 Gender: Female Note Status: Finalized Instrument Name: [id number] Procedure:             Colonoscopy Indications:           Screening for colorectal malignant neoplasm Providers:             Corinn Jess Brooklyn MD, MD Referring MD:          Leita Adie, MD (Referring MD) Medicines:             General Anesthesia Complications:         No immediate complications. Estimated blood loss: None. Procedure:             Pre-Anesthesia Assessment:                        - Prior to the procedure, a History and Physical was                         performed, and patient medications and allergies were                         reviewed. The patient is competent. The risks and                         benefits of the procedure and the sedation options and                         risks were discussed with the patient. All questions                         were answered and informed consent was obtained.                         Patient identification and proposed procedure were                         verified by the physician, the nurse, the                         anesthesiologist, the anesthetist and the technician                         in the pre-procedure area in the procedure room in the                         endoscopy suite. Mental Status Examination: alert and                         oriented. Airway Examination: normal oropharyngeal                         airway and neck mobility. Respiratory Examination:                         clear to auscultation. CV Examination: normal.  Prophylactic Antibiotics: The patient does not require                         prophylactic antibiotics. Prior Anticoagulants: The                         patient has  taken no anticoagulant or antiplatelet                         agents. ASA Grade Assessment: II - A patient with mild                         systemic disease. After reviewing the risks and                         benefits, the patient was deemed in satisfactory                         condition to undergo the procedure. The anesthesia                         plan was to use general anesthesia. Immediately prior                         to administration of medications, the patient was                         re-assessed for adequacy to receive sedatives. The                         heart rate, respiratory rate, oxygen saturations,                         blood pressure, adequacy of pulmonary ventilation, and                         response to care were monitored throughout the                         procedure. The physical status of the patient was                         re-assessed after the procedure.                        After obtaining informed consent, the colonoscope was                         passed under direct vision. Throughout the procedure,                         the patient's blood pressure, pulse, and oxygen                         saturations were monitored continuously. The was                         introduced through the anus and advanced to the the  cecum, identified by appendiceal orifice and ileocecal                         valve. The colonoscopy was performed without                         difficulty. The patient tolerated the procedure well.                         The quality of the bowel preparation was evaluated                         using the BBPS [facility] Bowel Preparation Scale) with                         scores of: Right Colon = 3, Transverse Colon = 3 and                         Left Colon = 3 (entire mucosa seen well with no                         residual staining, small fragments of stool or opaque                          liquid). The total BBPS score equals 9. The ileocecal                         valve, appendiceal orifice, and rectum were                         photographed. Findings:      The perianal and digital rectal examinations were normal. Pertinent       negatives include normal sphincter tone and no palpable rectal lesions.      A 9 mm polyp was found in the proximal ascending colon. The polyp was       flat. The polyp was removed with a cold snare. Resection and retrieval       were complete. Estimated blood loss was minimal.      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      The retroflexed view of the distal rectum and anal verge was normal and       showed no anal or rectal abnormalities.      The exam was otherwise without abnormality. Impression:            - One 9 mm polyp in the proximal ascending colon,                         removed with a cold snare. Resected and retrieved.                        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                        - The distal rectum and anal verge are normal on                         retroflexion  view.                        - The examination was otherwise normal. Recommendation:        - Discharge patient to home (with escort).                        - Resume previous diet today.                        - Continue present medications.                        - Await pathology results.                        - Repeat colonoscopy in 5-10 years for surveillance                         based on pathology results. Procedure Code(s):     --- Professional ---                        [phone], Colonoscopy, flexible; with removal of                         tumor(s), polyp(s), or other lesion(s) by snare                         technique Diagnosis Code(s):     --- Professional ---                        Z12.11, Encounter for screening for malignant neoplasm                         of colon                        D12.2, Benign neoplasm  of ascending colon                        D17.5, Benign lipomatous neoplasm of intra-abdominal                         organs CPT copyright 2022 American Medical Association. All rights reserved. The codes documented in this report are preliminary and upon coder review may  be revised to meet current compliance requirements. Dr. Angelita Brooklyn Corinn Jess Brooklyn MD, MD 06/02/2023 9:49:03 AM This report has been signed electronically. Number of Addenda: 0 Note Initiated On: 06/02/2023 9:18 AM Scope Withdrawal Time: 0 hours 13 minutes 29 seconds  Total Procedure Duration: 0 hours 17 minutes 48 seconds  Estimated Blood Loss:  Estimated blood loss: none.      [facility]

## 2023-06-02 NOTE — Anesthesia Postprocedure Evaluation (Signed)
 Anesthesia Post Note  Patient: Kathy Dougherty  Procedure(s) Performed: COLONOSCOPY WITH PROPOFOL  (Rectum) POLYPECTOMY (Rectum)  Patient location during evaluation: PACU Anesthesia Type: General Level of consciousness: awake and alert Pain management: pain level controlled Vital Signs Assessment: post-procedure vital signs reviewed and stable Respiratory status: spontaneous breathing, nonlabored ventilation, respiratory function stable and patient connected to nasal cannula oxygen Cardiovascular status: blood pressure returned to baseline and stable Postop Assessment: no apparent nausea or vomiting Anesthetic complications: no  No notable events documented.   Last Vitals:  Vitals:   06/02/23 0806 06/02/23 0950  BP: 127/73 115/70  Pulse:  83  Resp: 19 (!) 22  Temp: 36.5 C (!) 36.1 C  SpO2: 99% 99%    Last Pain:  Vitals:   06/02/23 0950  TempSrc:   PainSc: Asleep                 Debby Mines

## 2023-06-02 NOTE — Transfer of Care (Signed)
 Immediate Anesthesia Transfer of Care Note  Patient: Kathy Dougherty  Procedure(s) Performed: COLONOSCOPY WITH PROPOFOL  (Rectum) POLYPECTOMY (Rectum)  Patient Location: PACU  Anesthesia Type: General  Level of Consciousness: awake, alert  and patient cooperative  Airway and Oxygen Therapy: Patient Spontanous Breathing and Patient connected to supplemental oxygen  Post-op Assessment: Post-op Vital signs reviewed, Patient's Cardiovascular Status Stable, Respiratory Function Stable, Patent Airway and No signs of Nausea or vomiting  Post-op Vital Signs: Reviewed and stable  Complications: No notable events documented.

## 2023-06-02 NOTE — H&P (Signed)
 Corinn JONELLE Brooklyn, MD [address]  [address]  [place], [place] [address]  Main: [phone]  Fax: [phone] Pager: [phone]  Primary Care Physician:  Justus Leita DEL, MD Primary Gastroenterologist:  Dr. Corinn JONELLE Brooklyn  Pre-Procedure History & Physical: HPI:  Kathy Dougherty is a 73 y.o. female is here for an colonoscopy.   Past Medical History:  Diagnosis Date   Blood clot in arm (HCC) 2003   left   Deaf, right    Diabetes mellitus without complication (HCC)    GERD (gastroesophageal reflux disease)    Hyperlipidemia    Hypertension    Seasonal allergies     Past Surgical History:  Procedure Laterality Date   CATARACT EXTRACTION Right 08/2014   CATARACT EXTRACTION Left 04/2016   HAMMER TOE SURGERY     PARTIAL HYSTERECTOMY     fibroids    Prior to Admission medications   Medication Sig Start Date End Date Taking? Authorizing Provider  acetaminophen (TYLENOL) 500 MG tablet Take 500 mg by mouth every 6 (six) hours as needed.   Yes [provider]  amLODipine  (NORVASC ) 2.5 MG tablet Take 1 tablet (2.5 mg total) by mouth daily. 12/31/22  Yes Justus Leita DEL, MD  Azelastine  HCl 137 MCG/SPRAY SOLN Place into both nostrils. 08/17/21  Yes [provider]  dapagliflozin  propanediol (FARXIGA ) 10 MG TABS tablet Take 1 tablet (10 mg total) by mouth daily before breakfast. 05/07/23  Yes Justus Leita DEL, MD  docusate sodium (COLACE) 100 MG capsule Take 100 mg by mouth 2 (two) times daily as needed.   Yes [provider]  glimepiride  (AMARYL ) 2 MG tablet Take 1 tablet (2 mg total) by mouth daily before breakfast. 12/31/22  Yes Justus Leita DEL, MD  hydrochlorothiazide  (HYDRODIURIL ) 25 MG tablet Take 1 tablet (25 mg total) by mouth daily. 12/31/22  Yes Justus Leita DEL, MD  ibuprofen  (ADVIL ,MOTRIN ) 600 MG tablet TAKE 1 TABLET (600 MG TOTAL) BY MOUTH EVERY 8 (EIGHT) HOURS AS NEEDED. 08/16/18  Yes Justus Leita DEL, MD  levocetirizine (XYZAL ) 5 MG  tablet Take 1 tablet (5 mg total) by mouth every evening. 04/28/22  Yes Justus Leita DEL, MD  losartan  (COZAAR ) 50 MG tablet Take 1 tablet (50 mg total) by mouth daily. 12/31/22  Yes Justus Leita DEL, MD  mometasone  (NASONEX ) 50 MCG/ACT nasal spray Place 2 sprays into the nose daily. 07/02/21  Yes Justus Leita DEL, MD  omeprazole  (PRILOSEC) 40 MG capsule Take 1 capsule (40 mg total) by mouth daily. 12/31/22  Yes Justus Leita DEL, MD  potassium chloride  SA (KLOR-CON  M20) 20 MEQ tablet Take 2 tablets (40 mEq total) by mouth daily. 05/07/23  Yes Justus Leita DEL, MD  simvastatin  (ZOCOR ) 10 MG tablet TAKE 1 TABLET BY MOUTH EVERYDAY AT BEDTIME 12/31/22  Yes Justus Leita DEL, MD  blood glucose meter kit and supplies KIT Dispense based on patient and insurance preference. Test BS bid 04/18/20   Justus Leita DEL, MD  cyclobenzaprine  (FLEXERIL ) 10 MG tablet Take 1 tablet (10 mg total) by mouth 3 (three) times daily as needed for muscle spasms. Patient not taking: Reported on 06/02/2023 02/10/23   Justus Leita DEL, MD  OneTouch Delica Lancets 33G MISC TEST TWICE A DAY 12/20/21   Justus Leita DEL, MD  [facility] ULTRA test strip TEST TWICE DAILY 08/30/20   Justus Leita DEL, MD    Allergies as of 05/05/2023 - Review Complete 05/05/2023  Allergen Reaction Noted   Ace inhibitors Cough 11/24/2017  Metformin  and related Diarrhea 03/20/2016   Penicillins Rash 03/15/2015   Sulfa antibiotics Rash 03/15/2015    Family History  Problem Relation Age of Onset   Diabetes Mother    Congestive Heart Failure Mother    Other Father        unknown medical history   Diabetes Sister    Cancer Brother    Cancer Brother    Heart disease Brother    Sudden death Brother    Heart disease Brother    Diabetes Brother    Diabetes Brother     Social History   Socioeconomic History   Marital status: Divorced    Spouse name: Not on file   Number of children: 0   Years of education: Not on file   Highest education  level: Some college, no degree  Occupational History   Occupation: retired  Tobacco Use   Smoking status: Never   Smokeless tobacco: Never  Vaping Use   Vaping status: Never Used  Substance and Sexual Activity   Alcohol use: No    Alcohol/week: 0.0 standard drinks of alcohol   Drug use: No   Sexual activity: Not Currently  Other Topics Concern   Not on file  Social History Narrative   Pt lives alone.    Social Drivers of Corporate Investment Banker Strain: Low Risk  (05/28/2023)   Overall Financial Resource Strain (CARDIA)    Difficulty of Paying Living Expenses: Not very hard  Food Insecurity: No Food Insecurity (05/28/2023)   Hunger Vital Sign    Worried About Running Out of Food in the Last Year: Never true    Ran Out of Food in the Last Year: Never true  Transportation Needs: No Transportation Needs (05/28/2023)   PRAPARE - Administrator, Civil Service (Medical): No    Lack of Transportation (Non-Medical): No  Physical Activity: Inactive (05/28/2023)   Exercise Vital Sign    Days of Exercise per Week: 0 days    Minutes of Exercise per Session: 0 min  Stress: No Stress Concern Present (05/28/2023)   [employer] of Occupational Health - Occupational Stress Questionnaire    Feeling of Stress : Not at all  Social Connections: Moderately Isolated (05/28/2023)   Social Connection and Isolation Panel [NHANES]    Frequency of Communication with Friends and Family: More than three times a week    Frequency of Social Gatherings with Friends and Family: More than three times a week    Attends Religious Services: More than 4 times per year    Active Member of [employer] or Organizations: No    Attends Banker Meetings: Never    Marital Status: Never married  Intimate Partner Violence: Not At Risk (05/28/2023)   Humiliation, Afraid, Rape, and Kick questionnaire    Fear of Current or Ex-Partner: No    Emotionally Abused: No    Physically Abused: No    Sexually  Abused: No    Review of Systems: See HPI, otherwise negative ROS  Physical Exam: BP 127/73   Temp 97.7 F (36.5 C) (Temporal)   Resp 19   Ht 5' 2.01 (1.575 m)   Wt 73.4 kg   SpO2 99%   BMI 29.59 kg/m  General:   Alert,  pleasant and cooperative in NAD Head:  Normocephalic and atraumatic. Neck:  Supple; no masses or thyromegaly. Lungs:  Clear throughout to auscultation.    Heart:  Regular rate and rhythm. Abdomen:  Soft, nontender and  nondistended. Normal bowel sounds, without guarding, and without rebound.   Neurologic:  Alert and  oriented x4;  grossly normal neurologically.  Impression/Plan: Kathy Dougherty is here for an colonoscopy to be performed for colon cancer screening  Risks, benefits, limitations, and alternatives regarding  colonoscopy have been reviewed with the patient.  Questions have been answered.  All parties agreeable.   Corinn Brooklyn, MD  06/02/2023, 8:12 AM

## 2023-06-03 ENCOUNTER — Encounter: Payer: Self-pay | Admitting: Gastroenterology

## 2023-06-03 LAB — SURGICAL PATHOLOGY

## 2023-06-05 ENCOUNTER — Encounter: Payer: Self-pay | Admitting: Gastroenterology

## 2023-07-01 ENCOUNTER — Other Ambulatory Visit: Payer: Self-pay | Admitting: Pharmacist

## 2023-07-01 DIAGNOSIS — E118 Type 2 diabetes mellitus with unspecified complications: Secondary | ICD-10-CM

## 2023-07-01 NOTE — Progress Notes (Signed)
07/01/2023 Name: Kathy Dougherty MRN: [id number] DOB: 01-06-1951  Chief Complaint  Patient presents with   Medication Assistance   Medication Management    Kathy Dougherty is a 73 y.o. year old female who presented for a telephone visit.   They were referred to the pharmacist by their PCP for assistance in managing medication access.      Subjective:   Care Team: Primary Care Provider: Reubin Milan, MD ; Next Scheduled Visit: 08/31/2023  Medication Access/Adherence  Current Pharmacy:  [facility] [address] [place] - [address] [place] [address] Phone: [phone] Fax: [phone]  [id number] - [place], [place] - [address] [place] [place] [address] Phone: [phone] Fax: [phone]  [facility] - [place], [place] - [address] [place] [address] Phone: [phone] Fax: [phone]   Patient reports affordability concerns with their medications: No  Patient reports access/transportation concerns to their pharmacy: No  Patient reports adherence concerns with their medications:  No       Diabetes:   Current medications:  - Farxiga 10 mg daily - Glimepiride 2 mg daily with breakfast   Current morning fasting glucose readings ranging 123-137   Patient denies hypoglycemic s/sx including dizziness, shakiness, sweating.    Statin therapy: simvastatin 10 mg daily   Current medication access support: enrolled in patient assistance for Farxiga from AZ[employer]Me through 05/18/2024   Objective:  Lab Results  Component Value Date   HGBA1C 7.4 (H) 05/04/2023    Lab Results  Component Value Date   CREATININE 0.65 06/02/2023   BUN 14 06/02/2023   NA 138 06/02/2023   K 3.2 (L) 06/02/2023   CL 104 06/02/2023   CO2 24 06/02/2023    Lab Results  Component Value Date   CHOL 115 05/04/2023   HDL 46 05/04/2023   LDLCALC 56 05/04/2023   TRIG 56 05/04/2023   CHOLHDL 2.5 05/04/2023     Medications Reviewed Today     Reviewed by Manuela Neptune, RPH-CPP (Pharmacist) on 07/01/23 at 1201  Med List Status: <None>   Medication Order Taking? Sig Documenting Provider Last Dose Status Informant  acetaminophen (TYLENOL) 500 MG tablet [id number]  Take 500 mg by mouth every 6 (six) hours as needed. [provider]  Active   amLODipine (NORVASC) 2.5 MG tablet [id number]  Take 1 tablet (2.5 mg total) by mouth daily. Reubin Milan, MD  Active   Azelastine HCl 137 MCG/SPRAY SOLN [id number]  Place into both nostrils. [provider]  Active   blood glucose meter kit and supplies KIT [id number]  Dispense based on patient and insurance preference. Test BS bid Reubin Milan, MD  Active   cyclobenzaprine (FLEXERIL) 10 MG tablet [id number]  Take 1 tablet (10 mg total) by mouth 3 (three) times daily as needed for muscle spasms.  Patient not taking: Reported on 06/02/2023   Reubin Milan, MD  Active Self  dapagliflozin propanediol (FARXIGA) 10 MG TABS tablet [id number] Yes Take 1 tablet (10 mg total) by mouth daily before breakfast. Reubin Milan, MD Taking Active            Med Note Ronney Asters, [facility]   Wed Jul 01, 2023 11:56 AM)    docusate sodium (COLACE) 100 MG capsule [id number]  Take 100 mg by mouth 2 (two) times daily as needed. [provider]  Active   glimepiride (  AMARYL) 2 MG tablet [id number] Yes Take 1 tablet (2 mg total) by mouth daily before breakfast. Reubin Milan, MD Taking Active   hydrochlorothiazide (HYDRODIURIL) 25 MG tablet [id number]  Take 1 tablet (25 mg total) by mouth daily. Reubin Milan, MD  Active   ibuprofen (ADVIL,MOTRIN) 600 MG tablet [id number]  TAKE 1 TABLET (600 MG TOTAL) BY MOUTH EVERY 8 (EIGHT) HOURS AS NEEDED. Reubin Milan, MD  Active   levocetirizine (XYZAL) 5 MG tablet [id number]  Take 1 tablet (5 mg total) by mouth every evening. Reubin Milan, MD  Active            Med Note Maisie Fus, PAULA   Thu May 28, 2023 10:41 AM) prn  losartan (COZAAR) 50 MG tablet [id number]  Take 1 tablet (50 mg total) by mouth daily. Reubin Milan, MD  Active   mometasone (NASONEX) 50 MCG/ACT nasal spray [id number]  Place 2 sprays into the nose daily. Reubin Milan, MD  Active            Med Note Maisie Fus, PAULA   Thu May 28, 2023 10:41 AM) prn  omeprazole (PRILOSEC) 40 MG capsule [id number]  Take 1 capsule (40 mg total) by mouth daily. Reubin Milan, MD  Active            Med Note Maisie Fus, PAULA   Thu May 28, 2023 10:41 AM) prn  [facility] Lancets 33G MISC [id number]  TEST TWICE A DAY Reubin Milan, MD  Active   [facility] ULTRA test strip [id number]  TEST TWICE DAILY Reubin Milan, MD  Active   potassium chloride SA (KLOR-CON M20) 20 MEQ tablet [id number] Yes Take 2 tablets (40 mEq total) by mouth daily. Reubin Milan, MD Taking Active   simvastatin (ZOCOR) 10 MG tablet [id number]  TAKE 1 TABLET BY MOUTH EVERYDAY AT BEDTIME Reubin Milan, MD  Active               Assessment/Plan:   Diabetes: - Currently controlled - Reviewed goal A1c, goal fasting, and goal 2 hour post prandial glucose - Reviewed dietary modifications including importance of having regular well-balanced meals and snacks throughout the day, while controlling carbohydrate portion sizes - Counsel patient on s/s of low blood sugar and how to treat lows             Encourage patient to pick up glucose tablets to carry with her  Advise patient against skipping meals - Recommend to check glucose, keep log of results and have this record to review at upcoming medical appointments. Patient to contact provider office sooner if needed for readings outside of established parameters or symptoms - Patient to follow up with AZ&Me patient assistance program as needed for refills of Farxiga. Phone number for program, [phone], provided to patient     Follow Up Plan: Clinical Pharmacist will follow up with patient by telephone  on 03/02/2024 at 11:00 AM    Estelle Grumbles, PharmD, [facility] [phone]

## 2023-07-01 NOTE — Patient Instructions (Signed)
Goals Addressed             This Visit's Progress    Pharmacy Goals       If you need to reach out to patient assistance programs regarding refills or to find out the status of your application, you can do so by calling:  AZ&Me at [phone]  The goal A1c is less than 7%. This is the best way to reduce the risk of the long term complications of diabetes, including heart disease, kidney disease, eye disease, strokes, and nerve damage. An A1c of less than 7% corresponds with fasting sugars less than 130 and 2 hour after meal sugars less than 180.  Our goal bad cholesterol, or LDL, is less than 70 . This is why it is important to continue taking your simvastatin.  Estelle Grumbles, PharmD, [facility] [phone]

## 2023-07-02 ENCOUNTER — Emergency Department: Payer: Medicare HMO

## 2023-07-02 ENCOUNTER — Other Ambulatory Visit: Payer: Self-pay

## 2023-07-02 ENCOUNTER — Encounter: Payer: Self-pay | Admitting: Emergency Medicine

## 2023-07-02 DIAGNOSIS — S8991XA Unspecified injury of right lower leg, initial encounter: Secondary | ICD-10-CM | POA: Diagnosis not present

## 2023-07-02 DIAGNOSIS — Y92512 Supermarket, store or market as the place of occurrence of the external cause: Secondary | ICD-10-CM | POA: Insufficient documentation

## 2023-07-02 DIAGNOSIS — W010XXA Fall on same level from slipping, tripping and stumbling without subsequent striking against object, initial encounter: Secondary | ICD-10-CM | POA: Diagnosis not present

## 2023-07-02 DIAGNOSIS — E119 Type 2 diabetes mellitus without complications: Secondary | ICD-10-CM | POA: Insufficient documentation

## 2023-07-02 DIAGNOSIS — M25561 Pain in right knee: Secondary | ICD-10-CM | POA: Diagnosis not present

## 2023-07-02 DIAGNOSIS — I10 Essential (primary) hypertension: Secondary | ICD-10-CM | POA: Insufficient documentation

## 2023-07-02 DIAGNOSIS — M1711 Unilateral primary osteoarthritis, right knee: Secondary | ICD-10-CM | POA: Diagnosis not present

## 2023-07-02 NOTE — ED Triage Notes (Signed)
Patient C/O mechanical fall after tripping and falling at a grocery store. Patient is C/O right knee pain. No blood thinners. Denies hitting head. No LOC.

## 2023-07-02 NOTE — ED Triage Notes (Signed)
Patient BIB from the [employer] where she fell onto the concrete ground having an accidental mechanical fall. Pt Aox4. Denies LOC, head trauma, R knee pain from the fall.

## 2023-07-03 ENCOUNTER — Emergency Department
Admission: EM | Admit: 2023-07-03 | Discharge: 2023-07-03 | Disposition: A | Discharge status: Home / Self Care | Payer: Medicare HMO | Attending: Emergency Medicine | Admitting: Emergency Medicine

## 2023-07-03 DIAGNOSIS — S8991XA Unspecified injury of right lower leg, initial encounter: Secondary | ICD-10-CM

## 2023-07-03 DIAGNOSIS — W19XXXA Unspecified fall, initial encounter: Secondary | ICD-10-CM

## 2023-07-03 NOTE — ED Provider Notes (Signed)
[facility] Provider Note    Event Date/Time   First MD Initiated Contact with Patient 07/03/23 [id number]     (approximate)   History   Fall   HPI  Kathy Dougherty is a 73 y.o. female   Past medical history of hypertension and diabetes presents emergency department with a fall.  She slipped over on uneven ground at [facility] and fell to her knee.  She injured her right knee.  Did not strike her head or receive any other injuries fortunately.  No other acute medical complaints.  Has been ambulatory since despite the anterior knee pain.   Physical Exam   Triage Vital Signs: ED Triage Vitals  Encounter Vitals Group     BP 07/02/23 2318 137/81     Systolic BP Percentile --      Diastolic BP Percentile --      Pulse Rate 07/02/23 2318 79     Resp 07/02/23 2318 18     Temp 07/02/23 2321 98.2 F (36.8 C)     Temp Source 07/02/23 2321 Oral     SpO2 07/02/23 2318 97 %     Weight 07/02/23 2319 160 lb 15 oz (73 kg)     Height 07/02/23 2319 5\' 2"  (1.575 m)     Head Circumference --      Peak Flow --      Pain Score 07/02/23 2319 5     Pain Loc --      Pain Education --      Exclude from Growth Chart --     Most recent vital signs: Vitals:   07/02/23 2318 07/02/23 2321  BP: 137/81   Pulse: 79   Resp: 18   Temp:  98.2 F (36.8 C)  SpO2: 97%     General: Awake, no distress.  CV:  Good peripheral perfusion.  Resp:  Normal effort.  Abd:  No distention.  Other:  Mild anterior knee tenderness to palpation on the right side.  Able to range fully neurovascular intact.  Head to toe examination reveals no obvious deformities or bony tenderness to palpation and she has a soft nontender abdomen.   ED Results / Procedures / Treatments   Labs (all labs ordered are listed, but only abnormal results are displayed) Labs Reviewed - No data to display     RADIOLOGY I independently reviewed and interpreted x-ray of the knee and see no obvious fracture or  dislocation I also reviewed radiologist's formal read.   PROCEDURES:  Critical Care performed: No  Procedures   MEDICATIONS ORDERED IN ED: Medications - No data to display  IMPRESSION / MDM / ASSESSMENT AND PLAN / ED COURSE  I reviewed the triage vital signs and the nursing notes.                                Patient's presentation is most consistent with acute complicated illness / injury requiring diagnostic workup.  Differential diagnosis includes, but is not limited to, knee fracture dislocation, ligamentous injury or contusion   MDM:     She had a knee injury w neg xr, ambulatory.  No other injuries by patient report nor my exam.  Stable for discharge, anticipatory guidance given and she will follow-up with her primary doctor.       FINAL CLINICAL IMPRESSION(S) / ED DIAGNOSES   Final diagnoses:  Fall, initial encounter  Right knee injury, initial  encounter     Rx / DC Orders   ED Discharge Orders     None        Note:  This document was prepared using Dragon voice recognition software and may include unintentional dictation errors.    Pilar Jarvis, MD 07/03/23 [phone]

## 2023-07-03 NOTE — Discharge Instructions (Signed)
Take acetaminophen 650 mg and ibuprofen 400 mg every 6 hours for pain.  Take with food.  Thank you for choosing [place] for your health care today!  Please see your primary doctor this week for a follow up appointment.   If you have any new, worsening, or unexpected symptoms call your doctor right away or come back to the emergency department for reevaluation.  It was my pleasure to care for you today.   Daneil Dan Modesto Charon, MD

## 2023-07-07 ENCOUNTER — Telehealth: Payer: Self-pay | Admitting: Internal Medicine

## 2023-07-07 NOTE — Telephone Encounter (Signed)
Copied from CRM [id number]. Topic: General - Other >> Jul 07, 2023 10:36 AM Priscille Loveless wrote: Reason for CRM: Pt called and stated that she needed a follow up appt after a fall and going to the ER. I can't schedule an appt with Dr Asencion Partridge there is nothing on the schedule. Please advise pt.

## 2023-07-08 ENCOUNTER — Other Ambulatory Visit: Payer: Self-pay | Admitting: Pharmacist

## 2023-07-14 ENCOUNTER — Encounter: Payer: Medicare HMO | Admitting: Family Medicine

## 2023-07-16 ENCOUNTER — Ambulatory Visit: Payer: Medicare HMO | Admitting: Dietician

## 2023-07-19 ENCOUNTER — Other Ambulatory Visit: Payer: Self-pay | Admitting: Internal Medicine

## 2023-07-19 DIAGNOSIS — E876 Hypokalemia: Secondary | ICD-10-CM

## 2023-07-20 NOTE — Telephone Encounter (Signed)
 Requested Prescriptions  Pending Prescriptions Disp Refills   potassium chloride SA (KLOR-CON M20) 20 MEQ tablet [Pharmacy Med Name: KLOR-CON M20 TABLET] 90 tablet 2    Sig: TAKE 1 TABLET BY MOUTH EVERY DAY     Endocrinology:  Minerals - Potassium Supplementation Failed - 07/20/2023  5:51 PM      Failed - K in normal range and within 360 days    Potassium  Date Value Ref Range Status  06/02/2023 3.2 (L) 3.5 - 5.1 mmol/L Final         Passed - Cr in normal range and within 360 days    Creatinine, Ser  Date Value Ref Range Status  06/02/2023 0.65 0.44 - 1.00 mg/dL Final         Passed - Valid encounter within last 12 months    Recent Outpatient Visits           1 month ago Acute URI   [place] [facility] Mordecai Maes, Melton Alar, PA   2 months ago Annual physical exam   [facility] at [facility], Nyoka Cowden, MD   3 months ago Acute non-recurrent maxillary sinusitis   [place] [facility] Ashley Royalty, Ocie Bob, MD   5 months ago Acute bilateral low back pain without sciatica   [place] [facility] at [facility] Nyoka Cowden, MD   6 months ago DM (diabetes mellitus), type 2 with complications [facility])   [place] [facility] at [facility], Nyoka Cowden, MD       Future Appointments             In 1 month Judithann Graves, Nyoka Cowden, MD [facility] at [facility], [facility]

## 2023-07-21 ENCOUNTER — Ambulatory Visit: Payer: Medicare HMO | Admitting: Family Medicine

## 2023-07-21 ENCOUNTER — Ambulatory Visit
Admission: RE | Admit: 2023-07-21 | Discharge: 2023-07-21 | Disposition: A | Discharge status: Home / Self Care | Attending: Family Medicine | Admitting: Family Medicine

## 2023-07-21 ENCOUNTER — Encounter: Payer: Self-pay | Admitting: Family Medicine

## 2023-07-21 ENCOUNTER — Ambulatory Visit
Admission: RE | Admit: 2023-07-21 | Discharge: 2023-07-21 | Disposition: A | Discharge status: Home / Self Care | Source: Ambulatory Visit | Attending: Family Medicine | Admitting: Family Medicine

## 2023-07-21 VITALS — BP 140/80 | HR 79 | Ht 62.0 in | Wt 162.8 lb

## 2023-07-21 DIAGNOSIS — M25561 Pain in right knee: Secondary | ICD-10-CM

## 2023-07-21 DIAGNOSIS — M25562 Pain in left knee: Secondary | ICD-10-CM | POA: Diagnosis not present

## 2023-07-21 MED ORDER — MELOXICAM 15 MG PO TABS: 15.0000 mg | ORAL_TABLET | Freq: Every day | ORAL | 0 refills | Status: DC

## 2023-07-21 NOTE — Patient Instructions (Addendum)
 Patient Plan: Knee Contusion and Bursitis  Knee Contusion and Osteoarthritis:  1. Take meloxicam 15 mg once daily with food for 1-2 weeks, then as needed. 2. Stop using ibuprofen; you may continue taking acetaminophen as needed. 3. Apply ice to your knees for 20 minutes as needed for pain relief. 4. Begin the provided home exercise program after one week, focusing on gentle strengthening and range of motion. 5. An x-ray of your left knee has been ordered to assess for underlying bone injury. 6. Schedule a follow-up appointment in six weeks. 7. Contact the clinic if significant pain persists after two weeks; a Zilretta injection may be considered, which requires prior ordering.  Bursitis:  1. Perform strengthening exercises included in the home exercise program to help reduce bursitis symptoms.  Red Flags:  - Contact the clinic if you experience worsening pain, increased swelling, or any new symptoms that affect your ability to move or perform daily activities.

## 2023-07-21 NOTE — Assessment & Plan Note (Signed)
 History of Present Illness Kathy Dougherty is a 73 year old female who presents following a fall.  She experienced a fall on July 02, 2023, landing on her chest, stomach, and knees, with the right knee absorbing most of the impact and twisting during the fall. This incident led to bilateral knee pain, with the right knee being more affected.  The pain is more pronounced in the front of the knees when walking and in the back when bending. Swelling and warmth in both knees were noted immediately after the fall, along with bruising and redness. No knee locking has occurred, but there was a sensation of the knee giving way once since the fall. She manages the pain with 400 mg of ibuprofen twice a day and Tylenol, preferring Tylenol for pain relief and using ibuprofen for its anti-inflammatory effects. Physical activity has been limited to avoid aggravating the pain.  An emergency room visit on the day of the fall included an x-ray of the right knee, which showed no fractures but indicated moderate osteoarthritis.  Physical Exam VITALS: BP- 140/80 INSPECTION: Trace swelling of the left knee compared to the right, no ecchymosis or deformities. PALPATION: Trace effusion in the left knee, contralateral benign. Bilateral patellar facets non-tender. Left knee lateral joint line tenderness, medial joint line benign. Positive pes anserine bursa tendon tenderness on the left with appreciable bursa. No effusion on the right knee. Right knee posterior aspect of lateral joint line tenderness. Right knee medial joint line tenderness. Minor tenderness at pes anserine bursa on the right. RANGE OF MOTION: Left knee range of motion 0-120 degrees, limited by pain at maximal flexion. Right knee range of motion 0-125 degrees at maximal terminal flexion. SPECIAL TESTS: Negative anterior and posterior drawer tests. Negative valgus and varus stress tests. Negative medial and lateral McMurray's on the left. Negative anterior  posterior drawer, valgus and varus stress, and McMurray's tests on the right.  RADIOLOGY Right knee X-ray: Moderate osteoarthritis with joint space narrowing, calcification in patellar tendon insertion, small superior and inferior patellar osteophytes, no acute osseous injuries (07/02/2023)  Assessment and Plan Bilateral knee contusion Trauma related knee pain in the setting of bilateral knee osteoarthritis with moderate severity in the right knee and clinically two-compartment involvement in the left knee, exacerbated by a fall. X-rays show joint space narrowing, calcium formation, and bone spurs. No significant soft tissue damage. Goal: reduce inflammation, restore function. - Prescribed meloxicam 15 mg once daily with food x 1-2 weeks then daily as needed. - Advised to stop ibuprofen, continue acetaminophen as needed. - Recommended icing knees for 20 minutes as needed. - Provided home exercise program to start after one week, focusing on gentle strengthening and range of motion. - Ordered x-ray of left knee for additional injury assessment. - Scheduled follow-up in six weeks. - Instructed to contact clinic if significant pain persists after two weeks for potential Zilretta injection.  This will need to be ordered ahead of time if needed.  Bilateral pes anserine bursitis Bursitis in knees, secondary to compensatory muscle activity from osteoarthritis. Strengthening exercises recommended. - Included strengthening exercises in home program to reduce bursitis symptoms.

## 2023-07-21 NOTE — Progress Notes (Signed)
 Primary Care / Sports Medicine Office Visit  Patient Information:  Patient ID: Kathy Dougherty, female DOB: 1950-12-02 Age: 73 y.o. MRN: [id number]   Kathy Dougherty is a pleasant 73 y.o. female presenting with the following:  Chief Complaint  Patient presents with   Knee Pain    Bil knee pain since patient fell on 07/03/23 at [facility]. Patient knees are aching when she is walking and bending. She has xray of her right knee. She has been taking ibuprofen and tylenol. Patient has wore a ace wrap as well.     Vitals:   07/21/23 0955  BP: (!) 140/80  Pulse: 79  SpO2: 98%   Vitals:   07/21/23 0955  Weight: 162 lb 12.8 oz (73.8 kg)  Height: 5\' 2"  (1.575 m)   Body mass index is 29.78 kg/m.     Independent interpretation of notes and tests performed by another provider:   See below  Procedures performed:   None  Pertinent History, Exam, Impression, and Recommendations:   Problem List Items Addressed This Visit     Acute bilateral knee pain - Primary   History of Present Illness Kathy Dougherty is a 73 year old female who presents following a fall.  She experienced a fall on July 02, 2023, landing on her chest, stomach, and knees, with the right knee absorbing most of the impact and twisting during the fall. This incident led to bilateral knee pain, with the right knee being more affected.  The pain is more pronounced in the front of the knees when walking and in the back when bending. Swelling and warmth in both knees were noted immediately after the fall, along with bruising and redness. No knee locking has occurred, but there was a sensation of the knee giving way once since the fall. She manages the pain with 400 mg of ibuprofen twice a day and Tylenol, preferring Tylenol for pain relief and using ibuprofen for its anti-inflammatory effects. Physical activity has been limited to avoid aggravating the pain.  An emergency room visit on the day of the fall  included an x-ray of the right knee, which showed no fractures but indicated moderate osteoarthritis.  Physical Exam VITALS: BP- 140/80 INSPECTION: Trace swelling of the left knee compared to the right, no ecchymosis or deformities. PALPATION: Trace effusion in the left knee, contralateral benign. Bilateral patellar facets non-tender. Left knee lateral joint line tenderness, medial joint line benign. Positive pes anserine bursa tendon tenderness on the left with appreciable bursa. No effusion on the right knee. Right knee posterior aspect of lateral joint line tenderness. Right knee medial joint line tenderness. Minor tenderness at pes anserine bursa on the right. RANGE OF MOTION: Left knee range of motion 0-120 degrees, limited by pain at maximal flexion. Right knee range of motion 0-125 degrees at maximal terminal flexion. SPECIAL TESTS: Negative anterior and posterior drawer tests. Negative valgus and varus stress tests. Negative medial and lateral McMurray's on the left. Negative anterior posterior drawer, valgus and varus stress, and McMurray's tests on the right.  RADIOLOGY Right knee X-ray: Moderate osteoarthritis with joint space narrowing, calcification in patellar tendon insertion, small superior and inferior patellar osteophytes, no acute osseous injuries (07/02/2023)  Assessment and Plan Bilateral knee contusion Trauma related knee pain in the setting of bilateral knee osteoarthritis with moderate severity in the right knee and clinically two-compartment involvement in the left knee, exacerbated by a fall. X-rays show joint space narrowing, calcium formation, and bone  spurs. No significant soft tissue damage. Goal: reduce inflammation, restore function. - Prescribed meloxicam 15 mg once daily with food x 1-2 weeks then daily as needed. - Advised to stop ibuprofen, continue acetaminophen as needed. - Recommended icing knees for 20 minutes as needed. - Provided home exercise program to  start after one week, focusing on gentle strengthening and range of motion. - Ordered x-ray of left knee for additional injury assessment. - Scheduled follow-up in six weeks. - Instructed to contact clinic if significant pain persists after two weeks for potential Zilretta injection.  This will need to be ordered ahead of time if needed.  Bilateral pes anserine bursitis Bursitis in knees, secondary to compensatory muscle activity from osteoarthritis. Strengthening exercises recommended. - Included strengthening exercises in home program to reduce bursitis symptoms.      Relevant Medications   meloxicam (MOBIC) 15 MG tablet   Other Relevant Orders   DG Knee Complete 4 Views Left     Orders & Medications Medications:  Meds ordered this encounter  Medications   meloxicam (MOBIC) 15 MG tablet    Sig: Take 1 tablet (15 mg total) by mouth daily. X 1-2 weeks then daily as-needed. Take with food.    Dispense:  30 tablet    Refill:  0   Orders Placed This Encounter  Procedures   DG Knee Complete 4 Views Left     Return in about 6 weeks (around 09/01/2023) for f/u [name] bl knee.     Jerrol Banana, MD, [facility]   Primary Care Sports Medicine Primary Care and Sports Medicine at [facility]

## 2023-08-12 ENCOUNTER — Encounter: Payer: Self-pay | Admitting: Internal Medicine

## 2023-08-13 ENCOUNTER — Ambulatory Visit: Payer: Self-pay

## 2023-08-13 ENCOUNTER — Ambulatory Visit: Admitting: Family Medicine

## 2023-08-13 NOTE — Telephone Encounter (Signed)
 Chief Complaint: Coughing, back pain, congestion Symptoms: see above Frequency: 1 week Pertinent Negatives: Patient denies fever Disposition: [] ED /[] Urgent Care (no appt availability in office) / [x] Appointment(In office/virtual)/ []  [facility]/ [] Home Care/ [] Refused Recommended Disposition /[] [place] [facility]/ []  Follow-up with PCP Additional Notes: Patient called in stating she has been experiencing a deep cough for roughly a week that has resulted in back pain in her upper middle back. Patient also has severe congestion. Advised patient on home care information but patient would like an appt today to be evaluated to rule out any further viral illness and to get evaluated for cough medication so she can sleep. Appt made for today for further evaluation.   Copied from CRM [id number]. Topic: Clinical - Red Word Triage >> Aug 13, 2023 12:40 PM Clayton Bibles wrote: Red Word that prompted transfer to Nurse Triage:  Caedence's cough has gotten worse; she is having pain in her upper back; no fever; stopped up >> Aug 13, 2023 12:42 PM Clayton Bibles wrote: A lot of pain in back and cannot sleep at night Reason for Disposition  Cough with cold symptoms (e.g., runny nose, postnasal drip, throat clearing)  Back pain  Answer Assessment - Initial Assessment Questions 1. ONSET: "When did the cough begin?"      Last week 2. SEVERITY: "How bad is the cough today?"      Mostly at night 3. SPUTUM: "Describe the color of your sputum" (none, dry cough; clear, white, yellow, green)     No 4. HEMOPTYSIS: "Are you coughing up any blood?" If so ask: "How much?" (flecks, streaks, tablespoons, etc.)     No 5. DIFFICULTY BREATHING: "Are you having difficulty breathing?" If Yes, ask: "How bad is it?" (e.g., mild, moderate, severe)    - MILD: No SOB at rest, mild SOB with walking, speaks normally in sentences, can lie down, no retractions, pulse < 100.    - MODERATE: SOB at rest, SOB with minimal  exertion and prefers to sit, cannot lie down flat, speaks in phrases, mild retractions, audible wheezing, pulse 100-120.    - SEVERE: Very SOB at rest, speaks in single words, struggling to breathe, sitting hunched forward, retractions, pulse > 120      No shortness of breath 6. FEVER: "Do you have a fever?" If Yes, ask: "What is your temperature, how was it measured, and when did it start?"     No 7. CARDIAC HISTORY: "Do you have any history of heart disease?" (e.g., heart attack, congestive heart failure)      No 8. LUNG HISTORY: "Do you have any history of lung disease?"  (e.g., pulmonary embolus, asthma, emphysema)     No 9. PE RISK FACTORS: "Do you have a history of blood clots?" (or: recent major surgery, recent prolonged travel, bedridden)     Past history of blood clot in arm 10. OTHER SYMPTOMS: "Do you have any other symptoms?" (e.g., runny nose, wheezing, chest pain)       Upper back pain  Answer Assessment - Initial Assessment Questions 1. ONSET: "When did the pain begin?"      About a week ago when patient started coughing 2. LOCATION: "Where does it hurt?" (upper, mid or lower back)     Upper middle back 3. SEVERITY: "How bad is the pain?"  (e.g., Scale 1-10; mild, moderate, or severe)   - MILD (1-3): Doesn't interfere with normal activities.    - MODERATE (4-7): Interferes with normal activities or  awakens from sleep.    - SEVERE (8-10): Excruciating pain, unable to do any normal activities.      4-5 4. PATTERN: "Is the pain constant?" (e.g., yes, no; constant, intermittent)      When coughing 5. RADIATION: "Does the pain shoot into your legs or somewhere else?"     No 6. CAUSE:  "What do you think is causing the back pain?"      Coughing 7. BACK OVERUSE:  "Any recent lifting of heavy objects, strenuous work or exercise?"     N/a 8. MEDICINES: "What have you taken so far for the pain?" (e.g., nothing, acetaminophen, NSAIDS)     Mucinex, cough syrup 9. NEUROLOGIC  SYMPTOMS: "Do you have any weakness, numbness, or problems with bowel/bladder control?"     No 10. OTHER SYMPTOMS: "Do you have any other symptoms?" (e.g., fever, abdomen pain, burning with urination, blood in urine)       Coughing  Protocols used: Cough - Acute Non-Productive-A-AH, Back Pain-A-AH

## 2023-08-13 NOTE — Telephone Encounter (Signed)
 Noted. Patient has an appointment scheduled for 08/13/2023.

## 2023-08-14 ENCOUNTER — Ambulatory Visit (INDEPENDENT_AMBULATORY_CARE_PROVIDER_SITE_OTHER): Admitting: Family Medicine

## 2023-08-14 ENCOUNTER — Encounter: Payer: Self-pay | Admitting: Family Medicine

## 2023-08-14 VITALS — BP 140/82 | HR 83 | Ht 62.0 in | Wt 162.0 lb

## 2023-08-14 DIAGNOSIS — J301 Allergic rhinitis due to pollen: Secondary | ICD-10-CM

## 2023-08-14 MED ORDER — BENZONATATE 100 MG PO CAPS: 100.0000 mg | ORAL_CAPSULE | Freq: Three times a day (TID) | ORAL | 0 refills | Status: DC | PRN

## 2023-08-14 MED ORDER — AZELASTINE HCL 0.1 % NA SOLN: 2.0000 | Freq: Two times a day (BID) | NASAL | 0 refills | Status: AC

## 2023-08-15 NOTE — Assessment & Plan Note (Addendum)
 History of Present Illness Kathy Dougherty is a 73 year old female who presents with a persistent cough and postnasal drip.  She has been experiencing a persistent dry cough for the past week, which is more pronounced at night and causes back pain due to its intensity. The cough is non-productive with minimal expectoration.  She also experiences postnasal drip and congestion, particularly during the day, affecting her throat. No fever or headaches are present, but she feels "tired" around the cheekbones. She has difficulty breathing when congested but generally feels she is getting enough air into her lungs.  Her symptoms worsened last week when the pollen count was high, prompting her to stay indoors since Thursday to avoid exposure.  Her current medications include Allegra, which was ineffective, and she usually takes Zyrtec, which she finds more effective. She has used a prescribed Phenergan-dextromethorphan cough syrup previously, which caused constipation, and has used [employer] in the past for cough relief.  Physical Exam VITALS: P- 83, SaO2- 100% HEENT: Oropharynx benign.  Nasal mucosa mildly erythematous. Tenderness at frontal and maxillary sinuses bilaterally.  Tympanic membranes and canals benign bilaterally. NECK: Left posterior cervical lymphadenopathy. CHEST: Lungs clear to auscultation. Coarse breath sounds on expiration that clear after repetitive coughing. CARDIOVASCULAR: Heart sounds benign.  Assessment and Plan Allergic Rhinitis with Postnasal Drip Symptoms consistent with allergic rhinitis, likely exacerbated by high pollen levels. This is considered an acute exacerbation of a chronic condition.  Differential includes viral infection, but allergy symptoms predominate. - Prescribed Astelin nasal spray, two sprays each nostril twice daily. - Prescribed Tessalon Perles as needed for cough, especially at night. - Recommended over-the-counter Flonase, two sprays each  nostril nightly. - Advised daily Zyrtec. - Instructed to take Mucinex as directed, preferably 12-hour formulation. - Reassess if no improvement by August 19, 2023, to consider empiric antibiotics.

## 2023-08-15 NOTE — Progress Notes (Signed)
 Primary Care / Sports Medicine Office Visit  Patient Information:  Patient ID: Kathy Dougherty, female DOB: [birth year] Age: 73 y.o. MRN: [id number]   Kathy Dougherty is a pleasant 73 y.o. female presenting with the following:  Chief Complaint  Patient presents with   Cough    Cough x 1 week. Patient has nasal congestion and pressure in her head. Sometimes the cough is productive but sometimes it is dry. She is also having post nasal drip. She was taking allegra for about a week but it did not seem to help.     Vitals:   08/14/23 0906  BP: (!) 140/82  Pulse: 83  SpO2: 100%   Vitals:   08/14/23 0906  Weight: 162 lb (73.5 kg)  Height: 5\' 2"  (1.575 m)   Body mass index is 29.63 kg/m.  DG Knee Complete 4 Views Left Result Date: 08/05/2023 CLINICAL DATA:  Left knee pain status post trauma. EXAM: LEFT KNEE - COMPLETE 4+ VIEW COMPARISON:  None Available. FINDINGS: Minimal osteoarthrosis with narrowing of the medial compartment of the knee. No other fracture is bony or articular abnormalities. No fractures IMPRESSION: Minimal osteoarthrosis. Electronically Signed   By: Shaaron Adler M.D.   On: 08/05/2023 18:02     Independent interpretation of notes and tests performed by another provider:   None  Procedures performed:   None  Pertinent History, Exam, Impression, and Recommendations:   Problem List Items Addressed This Visit     Allergic rhinitis, seasonal - Primary   History of Present Illness Kathy Dougherty is a 73 year old female who presents with a persistent cough and postnasal drip.  She has been experiencing a persistent dry cough for the past week, which is more pronounced at night and causes back pain due to its intensity. The cough is non-productive with minimal expectoration.  She also experiences postnasal drip and congestion, particularly during the day, affecting her throat. No fever or headaches are present, but she feels "tired" around the cheekbones.  She has difficulty breathing when congested but generally feels she is getting enough air into her lungs.  Her symptoms worsened last week when the pollen count was high, prompting her to stay indoors since Thursday to avoid exposure.  Her current medications include Allegra, which was ineffective, and she usually takes Zyrtec, which she finds more effective. She has used a prescribed Phenergan-dextromethorphan cough syrup previously, which caused constipation, and has used [employer] in the past for cough relief.  Physical Exam VITALS: P- 83, SaO2- 100% HEENT: Oropharynx benign.  Nasal mucosa mildly erythematous. Tenderness at frontal and maxillary sinuses bilaterally.  Tympanic membranes and canals benign bilaterally. NECK: Left posterior cervical lymphadenopathy. CHEST: Lungs clear to auscultation. Coarse breath sounds on expiration that clear after repetitive coughing. CARDIOVASCULAR: Heart sounds benign.  Assessment and Plan Allergic Rhinitis with Postnasal Drip Symptoms consistent with allergic rhinitis, likely exacerbated by high pollen levels. This is considered an acute exacerbation of a chronic condition.  Differential includes viral infection, but allergy symptoms predominate. - Prescribed Astelin nasal spray, two sprays each nostril twice daily. - Prescribed Tessalon Perles as needed for cough, especially at night. - Recommended over-the-counter Flonase, two sprays each nostril nightly. - Advised daily Zyrtec. - Instructed to take Mucinex as directed, preferably 12-hour formulation. - Reassess if no improvement by August 19, 2023, to consider empiric antibiotics.      Relevant Medications   azelastine (ASTELIN) 0.1 % nasal spray   benzonatate (TESSALON  PERLES) 100 MG capsule     Orders & Medications Medications:  Meds ordered this encounter  Medications   azelastine (ASTELIN) 0.1 % nasal spray    Sig: Place 2 sprays into both nostrils 2 (two) times daily. Use in each  nostril as directed    Dispense:  30 mL    Refill:  0    Use generic Astelin   benzonatate (TESSALON PERLES) 100 MG capsule    Sig: Take 1-2 capsules (100-200 mg total) by mouth 3 (three) times daily as needed for cough.    Dispense:  20 capsule    Refill:  0   No orders of the defined types were placed in this encounter.    No follow-ups on file.     Jerrol Banana, MD, [facility]   Primary Care Sports Medicine Primary Care and Sports Medicine at [facility]

## 2023-08-15 NOTE — Patient Instructions (Signed)
 Patient Care Plan  1. Allergic Rhinitis Management:    - Use Astelin nasal spray: two sprays in each nostril, twice daily.    - Take Tessalon Perles as needed for cough, especially at night.    - Use over-the-counter Flonase: two sprays in each nostril nightly.    - Take Zyrtec daily.    - Use Mucinex as directed, preferably the 12-hour formulation.  2. Follow-Up:    - If symptoms do not improve by August 19, 2023, contact us to consider further treatment options, such as antibiotics.  Red Flags  - Seek medical attention if you experience severe symptoms like difficulty breathing or persistent high fever.

## 2023-08-31 ENCOUNTER — Encounter: Payer: Self-pay | Admitting: Internal Medicine

## 2023-08-31 ENCOUNTER — Ambulatory Visit (INDEPENDENT_AMBULATORY_CARE_PROVIDER_SITE_OTHER): Payer: Self-pay | Admitting: Internal Medicine

## 2023-08-31 VITALS — BP 116/68 | HR 74 | Ht 62.0 in | Wt 164.1 lb

## 2023-08-31 DIAGNOSIS — E785 Hyperlipidemia, unspecified: Secondary | ICD-10-CM

## 2023-08-31 DIAGNOSIS — I10 Essential (primary) hypertension: Secondary | ICD-10-CM | POA: Diagnosis not present

## 2023-08-31 DIAGNOSIS — E1169 Type 2 diabetes mellitus with other specified complication: Secondary | ICD-10-CM | POA: Diagnosis not present

## 2023-08-31 DIAGNOSIS — E118 Type 2 diabetes mellitus with unspecified complications: Secondary | ICD-10-CM

## 2023-08-31 DIAGNOSIS — M25562 Pain in left knee: Secondary | ICD-10-CM

## 2023-08-31 DIAGNOSIS — M25561 Pain in right knee: Secondary | ICD-10-CM

## 2023-08-31 DIAGNOSIS — Z7984 Long term (current) use of oral hypoglycemic drugs: Secondary | ICD-10-CM | POA: Diagnosis not present

## 2023-08-31 LAB — POCT GLYCOSYLATED HEMOGLOBIN (HGB A1C): Hemoglobin A1C: 6.5 % — AB (ref 4.0–5.6)

## 2023-08-31 NOTE — Progress Notes (Signed)
 Date:  08/31/2023   Name:  Kathy Dougherty   DOB:  1950/08/09   MRN:  [id number]   Chief Complaint: Hypertension and Diabetes  Hypertension This is a chronic problem. The problem is controlled. Pertinent negatives include no chest pain, headaches, palpitations or shortness of breath. Past treatments include angiotensin blockers, calcium channel blockers and diuretics.  Diabetes She presents for her follow-up diabetic visit. She has type 2 diabetes mellitus. There are no hypoglycemic associated symptoms. Pertinent negatives for hypoglycemia include no dizziness, headaches or tremors. Pertinent negatives for diabetes include no chest pain, no fatigue, no polydipsia, no polyuria and no weakness. Current diabetic treatments: Farxiga and glimepiride.  Knee pain - both knees injured at a fall at [facility].  Seen by Dr. Ashley Royalty and xrays done, treated with Mobic.  Feeling much better now.  Unsure how to proceed and get visits covered.  I recommend follow up with [employer] about bills and see if she needs a final clearance visit from ortho.  Review of Systems  Constitutional:  Negative for appetite change, fatigue, fever and unexpected weight change.  HENT:  Negative for tinnitus and trouble swallowing.   Eyes:  Negative for visual disturbance.  Respiratory:  Negative for cough, chest tightness, shortness of breath and wheezing.   Cardiovascular:  Negative for chest pain, palpitations and leg swelling.  Gastrointestinal:  Negative for abdominal pain, constipation and diarrhea.  Endocrine: Negative for polydipsia and polyuria.  Genitourinary:  Negative for dysuria and hematuria.  Musculoskeletal:  Negative for arthralgias and myalgias.  Neurological:  Negative for dizziness, tremors, weakness, light-headedness, numbness and headaches.  Psychiatric/Behavioral:  Negative for dysphoric mood.      Lab Results  Component Value Date   NA 138 06/02/2023   K 3.2 (L) 06/02/2023   CO2 24 06/02/2023    GLUCOSE 91 06/02/2023   BUN 14 06/02/2023   CREATININE 0.65 06/02/2023   CALCIUM 9.3 06/02/2023   EGFR 95 05/04/2023   GFRNONAA >60 06/02/2023   Lab Results  Component Value Date   CHOL 115 05/04/2023   HDL 46 05/04/2023   LDLCALC 56 05/04/2023   TRIG 56 05/04/2023   CHOLHDL 2.5 05/04/2023   Lab Results  Component Value Date   TSH 1.120 05/04/2023   Lab Results  Component Value Date   HGBA1C 6.5 (A) 08/31/2023   Lab Results  Component Value Date   WBC 4.7 05/04/2023   HGB 12.8 05/04/2023   HCT 39.6 05/04/2023   MCV 86 05/04/2023   PLT 219 05/04/2023   Lab Results  Component Value Date   ALT 21 05/04/2023   AST 22 05/04/2023   ALKPHOS 83 05/04/2023   BILITOT 0.5 05/04/2023   No results found for: "25OHVITD2", "25OHVITD3", "VD25OH"   Patient Active Problem List   Diagnosis Date Noted   Acute bilateral knee pain 07/21/2023   Encounter for screening colonoscopy 06/02/2023   Polyp of ascending colon 06/02/2023   Lipoma of colon 06/02/2023   Hypokalemia 04/29/2022   Hypercalcemia 01/07/2022   Aortic arch atherosclerosis (HCC) 07/02/2021   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   Lipoma of lateral chest wall 04/18/2020   Porokeratosis 08/05/2019   Hyperlipidemia associated with type 2 diabetes mellitus (HCC) 03/26/2017   Iritis of left eye 03/26/2017   Pre-ulcerative calluses 03/26/2017   Arthritis of right knee 01/15/2016   DM (diabetes mellitus), type 2 with complications (HCC) 03/15/2015   Abnormal LFTs 03/15/2015   Essential (primary) hypertension 03/15/2015   Gastro-esophageal reflux  disease without esophagitis 03/15/2015   Allergic rhinitis, seasonal 03/15/2015    Allergies  Allergen Reactions   Ace Inhibitors Cough   Metformin And Related Diarrhea   Penicillins Rash   Sulfa Antibiotics Rash    Past Surgical History:  Procedure Laterality Date   CATARACT EXTRACTION Right 08/2014   CATARACT EXTRACTION Left 04/2016   COLONOSCOPY WITH PROPOFOL N/A  06/02/2023   Procedure: COLONOSCOPY WITH PROPOFOL;  Surgeon: Toney Reil, MD;  Location: [facility];  Service: Endoscopy;  Laterality: N/A;  Diabetic   HAMMER TOE SURGERY     PARTIAL HYSTERECTOMY     fibroids   POLYPECTOMY  06/02/2023   Procedure: POLYPECTOMY;  Surgeon: Toney Reil, MD;  Location: [facility];  Service: Endoscopy;;    Social History   Tobacco Use   Smoking status: Never   Smokeless tobacco: Never  Vaping Use   Vaping status: Never Used  Substance Use Topics   Alcohol use: No    Alcohol/week: 0.0 standard drinks of alcohol   Drug use: No     Medication list has been reviewed and updated.  Current Meds  Medication Sig   amLODipine (NORVASC) 2.5 MG tablet Take 1 tablet (2.5 mg total) by mouth daily.   azelastine (ASTELIN) 0.1 % nasal spray Place 2 sprays into both nostrils 2 (two) times daily. Use in each nostril as directed   benzonatate (TESSALON PERLES) 100 MG capsule Take 1-2 capsules (100-200 mg total) by mouth 3 (three) times daily as needed for cough.   blood glucose meter kit and supplies KIT Dispense based on patient and insurance preference. Test BS bid   dapagliflozin propanediol (FARXIGA) 10 MG TABS tablet Take 1 tablet (10 mg total) by mouth daily before breakfast.   glimepiride (AMARYL) 2 MG tablet Take 1 tablet (2 mg total) by mouth daily before breakfast.   hydrochlorothiazide (HYDRODIURIL) 25 MG tablet Take 1 tablet (25 mg total) by mouth daily.   losartan (COZAAR) 50 MG tablet Take 1 tablet (50 mg total) by mouth daily.   mometasone (NASONEX) 50 MCG/ACT nasal spray Place 2 sprays into the nose daily.   omeprazole (PRILOSEC) 40 MG capsule Take 1 capsule (40 mg total) by mouth daily.   OneTouch Delica Lancets 33G MISC TEST TWICE A DAY   ONETOUCH ULTRA test strip TEST TWICE DAILY   potassium chloride SA (KLOR-CON M20) 20 MEQ tablet TAKE 1 TABLET BY MOUTH EVERY DAY   simvastatin (ZOCOR) 10 MG tablet TAKE 1 TABLET BY  MOUTH EVERYDAY AT BEDTIME       08/31/2023    9:49 AM 05/01/2023    9:39 AM 03/27/2023    2:50 PM 02/10/2023    2:05 PM  GAD 7 : Generalized Anxiety Score  Nervous, Anxious, on Edge 0 0 0 0  Control/stop worrying 0 0 0 0  Worry too much - different things 0 0 0 0  Trouble relaxing 0 0 0 1  Restless 0 0 0 0  Easily annoyed or irritable 0 0 0 0  Afraid - awful might happen 0 0 0 0  Total GAD 7 Score 0 0 0 1  Anxiety Difficulty Not difficult at all Not difficult at all Not difficult at all Not difficult at all       08/31/2023    9:48 AM 05/28/2023   10:46 AM 05/01/2023    9:39 AM  Depression screen PHQ 2/9  Decreased Interest 0 0 0  Down, Depressed, Hopeless 0 0  0  PHQ - 2 Score 0 0 0  Altered sleeping 0 0 2  Tired, decreased energy 0 0 2  Change in appetite 0 0 0  Feeling bad or failure about yourself  0 0 0  Trouble concentrating 0 0 0  Moving slowly or fidgety/restless 0 0 0  Suicidal thoughts 0 0 0  PHQ-9 Score 0 0 4  Difficult doing work/chores Not difficult at all Not difficult at all Not difficult at all    BP Readings from Last 3 Encounters:  08/31/23 116/68  08/14/23 (!) 140/82  07/21/23 (!) 140/80    Physical Exam Vitals and nursing note reviewed.  Constitutional:      General: She is not in acute distress.    Appearance: Normal appearance. She is well-developed.  HENT:     Head: Normocephalic and atraumatic.  Neck:     Vascular: No carotid bruit.  Cardiovascular:     Rate and Rhythm: Normal rate and regular rhythm.  Pulmonary:     Effort: Pulmonary effort is normal. No respiratory distress.     Breath sounds: No wheezing or rhonchi.  Musculoskeletal:     Cervical back: Normal range of motion.  Lymphadenopathy:     Cervical: No cervical adenopathy.  Skin:    General: Skin is warm and dry.     Capillary Refill: Capillary refill takes less than 2 seconds.     Findings: No rash.  Neurological:     General: No focal deficit present.     Mental  Status: She is alert and oriented to person, place, and time.  Psychiatric:        Mood and Affect: Mood normal.        Behavior: Behavior normal.     Wt Readings from Last 3 Encounters:  08/31/23 164 lb 2 oz (74.4 kg)  08/14/23 162 lb (73.5 kg)  07/21/23 162 lb 12.8 oz (73.8 kg)    BP 116/68   Pulse 74   Ht 5\' 2"  (1.575 m)   Wt 164 lb 2 oz (74.4 kg)   SpO2 97%   BMI 30.02 kg/m   Assessment and Plan:  Problem List Items Addressed This Visit       Unprioritized   DM (diabetes mellitus), type 2 with complications (HCC) - Primary (Chronic)   Blood sugars have been stable.  No recent hypoglycemic events requiring assistance. Currently medications are farxiga and glimepiride.. Lab Results  Component Value Date   HGBA1C 6.5 (A) 08/31/2023   Last visit Farxiga was increased to 10 mg. A1C today = 6.5 - no change in medications; consider stopping glimepiride next visit       Relevant Orders   POCT glycosylated hemoglobin (Hb A1C) (Completed)   Microalbumin / creatinine urine ratio   Essential (primary) hypertension (Chronic)   Blood pressure is well controlled.  Current medications are losartan, amlodipine and hydrochlorothiazide. Will continue same regimen along with efforts to limit dietary sodium.       Hyperlipidemia associated with type 2 diabetes mellitus (HCC) (Chronic)   LDL is  Lab Results  Component Value Date   LDLCALC 56 05/04/2023   Current regimen is simvastatin.  No medication side effects noted. Goal LDL is <70.       Acute bilateral knee pain   Other Visit Diagnoses       Long term current use of oral hypoglycemic drug           Return in about 4 months (around 12/31/2023)  for DM, HTN.    Sheron Dixons, MD [facility] [place]

## 2023-08-31 NOTE — Assessment & Plan Note (Signed)
 Blood pressure is well controlled.  Current medications are losartan, amlodipine and hydrochlorothiazide. Will continue same regimen along with efforts to limit dietary sodium.

## 2023-08-31 NOTE — Assessment & Plan Note (Signed)
 LDL is  Lab Results  Component Value Date   LDLCALC 56 05/04/2023   Current regimen is simvastatin.  No medication side effects noted. Goal LDL is <70.

## 2023-08-31 NOTE — Assessment & Plan Note (Addendum)
 Blood sugars have been stable.  No recent hypoglycemic events requiring assistance. Currently medications are farxiga and glimepiride.. Lab Results  Component Value Date   HGBA1C 6.5 (A) 08/31/2023   Last visit Farxiga was increased to 10 mg. A1C today = 6.5 - no change in medications; consider stopping glimepiride next visit

## 2023-09-01 ENCOUNTER — Ambulatory Visit: Admitting: Family Medicine

## 2023-09-03 LAB — MICROALBUMIN / CREATININE URINE RATIO
Creatinine, Urine: 109.6 mg/dL
Microalb/Creat Ratio: 4 mg/g{creat} (ref 0–29)
Microalbumin, Urine: 4.3 ug/mL

## 2023-12-08 ENCOUNTER — Other Ambulatory Visit: Payer: Self-pay | Admitting: Internal Medicine

## 2023-12-08 DIAGNOSIS — E118 Type 2 diabetes mellitus with unspecified complications: Secondary | ICD-10-CM

## 2023-12-10 NOTE — Telephone Encounter (Signed)
 OV 08/31/23 Requested Prescriptions  Pending Prescriptions Disp Refills   glimepiride  (AMARYL ) 2 MG tablet [Pharmacy Med Name: GLIMEPIRIDE  2 MG TABLET] 90 tablet 3    Sig: TAKE 1 TABLET BY MOUTH DAILY BEFORE BREAKFAST.     Endocrinology:  Diabetes - Sulfonylureas Passed - 12/10/2023  9:28 AM      Passed - HBA1C is between 0 and 7.9 and within 180 days    Hemoglobin A1C  Date Value Ref Range Status  08/31/2023 6.5 (A) 4.0 - 5.6 % Final   Hgb A1c MFr Bld  Date Value Ref Range Status  05/04/2023 7.4 (H) 4.8 - 5.6 % Final    Comment:             Prediabetes: 5.7 - 6.4          Diabetes: >6.4          Glycemic control for adults with diabetes: <7.0          Passed - Cr in normal range and within 360 days    Creatinine, Ser  Date Value Ref Range Status  06/02/2023 0.65 0.44 - 1.00 mg/dL Final         Passed - Valid encounter within last 6 months    Recent Outpatient Visits           3 months ago DM (diabetes mellitus), type 2 with complications [facility])   [place] [facility] at [facility], Leita DEL, MD   3 months ago Seasonal allergic rhinitis due to pollen   [facility] at [facility], Selinda PARAS, MD   4 months ago Acute bilateral knee pain   [facility] at [facility], Selinda PARAS, MD

## 2023-12-24 ENCOUNTER — Other Ambulatory Visit: Payer: Self-pay | Admitting: Internal Medicine

## 2023-12-24 DIAGNOSIS — I10 Essential (primary) hypertension: Secondary | ICD-10-CM

## 2023-12-24 DIAGNOSIS — K219 Gastro-esophageal reflux disease without esophagitis: Secondary | ICD-10-CM

## 2023-12-25 NOTE — Telephone Encounter (Signed)
 Requested Prescriptions  Pending Prescriptions Disp Refills   omeprazole  (PRILOSEC) 40 MG capsule [Pharmacy Med Name: OMEPRAZOLE  DR 40 MG CAPSULE] 90 capsule 0    Sig: TAKE 1 CAPSULE (40 MG TOTAL) BY MOUTH DAILY.     Gastroenterology: Proton Pump Inhibitors Passed - 12/25/2023  5:54 PM      Passed - Valid encounter within last 12 months    Recent Outpatient Visits           3 months ago DM (diabetes mellitus), type 2 with complications [facility])   [place] [facility] at [facility], Leita DEL, MD   4 months ago Seasonal allergic rhinitis due to pollen   [facility] Medicine at [facility], Selinda PARAS, MD   5 months ago Acute bilateral knee pain   [place] [facility] at [facility], Selinda PARAS, MD               amLODipine  (NORVASC ) 2.5 MG tablet [Pharmacy Med Name: AMLODIPINE  BESYLATE 2.5 MG TAB] 90 tablet 0    Sig: TAKE 1 TABLET BY MOUTH EVERY DAY     Cardiovascular: Calcium Channel Blockers 2 Passed - 12/25/2023  5:54 PM      Passed - Last BP in normal range    BP Readings from Last 1 Encounters:  08/31/23 116/68         Passed - Last Heart Rate in normal range    Pulse Readings from Last 1 Encounters:  08/31/23 74         Passed - Valid encounter within last 6 months    Recent Outpatient Visits           3 months ago DM (diabetes mellitus), type 2 with complications (HCC)   [place] [facility] at [facility], Leita DEL, MD   4 months ago Seasonal allergic rhinitis due to pollen   [facility] & [facility] Medicine at [facility], Selinda PARAS, MD   5 months ago Acute bilateral knee pain   [facility] Medicine at [facility], Selinda PARAS, MD

## 2024-01-01 ENCOUNTER — Ambulatory Visit: Admitting: Internal Medicine

## 2024-01-13 ENCOUNTER — Encounter: Payer: Self-pay | Admitting: Internal Medicine

## 2024-01-13 ENCOUNTER — Ambulatory Visit (INDEPENDENT_AMBULATORY_CARE_PROVIDER_SITE_OTHER): Admitting: Internal Medicine

## 2024-01-13 VITALS — BP 122/74 | HR 74 | Ht 62.0 in | Wt 164.0 lb

## 2024-01-13 DIAGNOSIS — E118 Type 2 diabetes mellitus with unspecified complications: Secondary | ICD-10-CM | POA: Diagnosis not present

## 2024-01-13 DIAGNOSIS — Z7984 Long term (current) use of oral hypoglycemic drugs: Secondary | ICD-10-CM

## 2024-01-13 DIAGNOSIS — Q828 Other specified congenital malformations of skin: Secondary | ICD-10-CM | POA: Diagnosis not present

## 2024-01-13 DIAGNOSIS — I10 Essential (primary) hypertension: Secondary | ICD-10-CM | POA: Diagnosis not present

## 2024-01-13 DIAGNOSIS — L84 Corns and callosities: Secondary | ICD-10-CM

## 2024-01-13 LAB — POCT GLYCOSYLATED HEMOGLOBIN (HGB A1C): Hemoglobin A1C: 6.4 % — AB (ref 4.0–5.6)

## 2024-01-13 MED ORDER — ONETOUCH ULTRA VI STRP: 1.0000 | ORAL_STRIP | Freq: Two times a day (BID) | 12 refills | Status: AC

## 2024-01-13 MED ORDER — ONETOUCH ULTRASOFT LANCETS MISC: 12 refills | Status: AC

## 2024-01-13 NOTE — Assessment & Plan Note (Signed)
 She was seeing [facility] but wants to see podiatry closer to home. Refer to Dr. Ashley.

## 2024-01-13 NOTE — Progress Notes (Signed)
 Date:  01/13/2024   Name:  Kathy Dougherty   DOB:  11-Jun-1950   MRN:  [id number]   Chief Complaint: Hypertension and Diabetes  Hypertension This is a chronic problem. The problem is controlled. Pertinent negatives include no chest pain, headaches, palpitations or shortness of breath.  Diabetes She presents for her follow-up diabetic visit. She has type 2 diabetes mellitus. Pertinent negatives for hypoglycemia include no dizziness, headaches or tremors. Pertinent negatives for diabetes include no chest pain, no fatigue, no polydipsia, no polyuria and no weakness. Current diabetic treatments: Farxiga  and glimepiride . There is no change in her home blood glucose trend.    Review of Systems  Constitutional:  Negative for appetite change, fatigue, fever and unexpected weight change.  HENT:  Negative for tinnitus and trouble swallowing.   Eyes:  Negative for visual disturbance.  Respiratory:  Negative for cough, chest tightness, shortness of breath and wheezing.   Cardiovascular:  Negative for chest pain, palpitations and leg swelling.  Gastrointestinal:  Negative for abdominal pain, constipation and diarrhea.  Endocrine: Negative for polydipsia and polyuria.  Genitourinary:  Negative for dysuria and hematuria.  Musculoskeletal:  Negative for arthralgias and myalgias.  Neurological:  Negative for dizziness, tremors, weakness, light-headedness, numbness and headaches.  Psychiatric/Behavioral:  Negative for dysphoric mood.      Lab Results  Component Value Date   NA 138 06/02/2023   K 3.2 (L) 06/02/2023   CO2 24 06/02/2023   GLUCOSE 91 06/02/2023   BUN 14 06/02/2023   CREATININE 0.65 06/02/2023   CALCIUM 9.3 06/02/2023   EGFR 95 05/04/2023   GFRNONAA >60 06/02/2023   Lab Results  Component Value Date   CHOL 115 05/04/2023   HDL 46 05/04/2023   LDLCALC 56 05/04/2023   TRIG 56 05/04/2023   CHOLHDL 2.5 05/04/2023   Lab Results  Component Value Date   TSH 1.120 05/04/2023    Lab Results  Component Value Date   HGBA1C 6.4 (A) 01/13/2024   Lab Results  Component Value Date   WBC 4.7 05/04/2023   HGB 12.8 05/04/2023   HCT 39.6 05/04/2023   MCV 86 05/04/2023   PLT 219 05/04/2023   Lab Results  Component Value Date   ALT 21 05/04/2023   AST 22 05/04/2023   ALKPHOS 83 05/04/2023   BILITOT 0.5 05/04/2023   No results found for: MARIEN BOLLS, VD25OH   Patient Active Problem List   Diagnosis Date Noted   Acute bilateral knee pain 07/21/2023   Encounter for screening colonoscopy 06/02/2023   Polyp of ascending colon 06/02/2023   Lipoma of colon 06/02/2023   Hypokalemia 04/29/2022   Hypercalcemia 01/07/2022   Aortic arch atherosclerosis (HCC) 07/02/2021   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   Lipoma of lateral chest wall 04/18/2020   Porokeratosis 08/05/2019   Hyperlipidemia associated with type 2 diabetes mellitus (HCC) 03/26/2017   Iritis of left eye 03/26/2017   Pre-ulcerative calluses 03/26/2017   Arthritis of right knee 01/15/2016   DM (diabetes mellitus), type 2 with complications (HCC) 03/15/2015   Abnormal LFTs 03/15/2015   Essential (primary) hypertension 03/15/2015   Gastro-esophageal reflux disease without esophagitis 03/15/2015   Allergic rhinitis, seasonal 03/15/2015    Allergies  Allergen Reactions   Ace Inhibitors Cough   Metformin  And Related Diarrhea   Penicillins Rash   Sulfa Antibiotics Rash    Past Surgical History:  Procedure Laterality Date   CATARACT EXTRACTION Right 08/2014   CATARACT EXTRACTION Left 04/2016  COLONOSCOPY WITH PROPOFOL  N/A 06/02/2023   Procedure: COLONOSCOPY WITH PROPOFOL ;  Surgeon: Unk Corinn Skiff, MD;  Location: [facility];  Service: Endoscopy;  Laterality: N/A;  Diabetic   HAMMER TOE SURGERY     PARTIAL HYSTERECTOMY     fibroids   POLYPECTOMY  06/02/2023   Procedure: POLYPECTOMY;  Surgeon: Unk Corinn Skiff, MD;  Location: [facility];  Service: Endoscopy;;     Social History   Tobacco Use   Smoking status: Never   Smokeless tobacco: Never  Vaping Use   Vaping status: Never Used  Substance Use Topics   Alcohol use: No    Alcohol/week: 0.0 standard drinks of alcohol   Drug use: No     Medication list has been reviewed and updated.  Current Meds  Medication Sig   amLODipine  (NORVASC ) 2.5 MG tablet TAKE 1 TABLET BY MOUTH EVERY DAY   azelastine  (ASTELIN ) 0.1 % nasal spray Place 2 sprays into both nostrils 2 (two) times daily. Use in each nostril as directed   benzonatate  (TESSALON  PERLES) 100 MG capsule Take 1-2 capsules (100-200 mg total) by mouth 3 (three) times daily as needed for cough.   blood glucose meter kit and supplies KIT Dispense based on patient and insurance preference. Test BS bid   dapagliflozin  propanediol (FARXIGA ) 10 MG TABS tablet Take 1 tablet (10 mg total) by mouth daily before breakfast.   glimepiride  (AMARYL ) 2 MG tablet TAKE 1 TABLET BY MOUTH DAILY BEFORE BREAKFAST.   hydrochlorothiazide  (HYDRODIURIL ) 25 MG tablet Take 1 tablet (25 mg total) by mouth daily.   Lancets (ONETOUCH ULTRASOFT) lancets Use as instructed   losartan  (COZAAR ) 50 MG tablet Take 1 tablet (50 mg total) by mouth daily.   mometasone  (NASONEX ) 50 MCG/ACT nasal spray Place 2 sprays into the nose daily.   omeprazole  (PRILOSEC) 40 MG capsule TAKE 1 CAPSULE (40 MG TOTAL) BY MOUTH DAILY.   potassium chloride  SA (KLOR-CON  M20) 20 MEQ tablet TAKE 1 TABLET BY MOUTH EVERY DAY   simvastatin  (ZOCOR ) 10 MG tablet TAKE 1 TABLET BY MOUTH EVERYDAY AT BEDTIME   [DISCONTINUED] OneTouch Delica Lancets 33G MISC TEST TWICE A DAY   [DISCONTINUED] ONETOUCH ULTRA test strip TEST TWICE DAILY       08/31/2023    9:49 AM 05/01/2023    9:39 AM 03/27/2023    2:50 PM 02/10/2023    2:05 PM  GAD 7 : Generalized Anxiety Score  Nervous, Anxious, on Edge 0 0 0 0  Control/stop worrying 0 0 0 0  Worry too much - different things 0 0 0 0  Trouble relaxing 0 0 0 1  Restless  0 0 0 0  Easily annoyed or irritable 0 0 0 0  Afraid - awful might happen 0 0 0 0  Total GAD 7 Score 0 0 0 1  Anxiety Difficulty Not difficult at all Not difficult at all Not difficult at all Not difficult at all       08/31/2023    9:48 AM 05/28/2023   10:46 AM 05/01/2023    9:39 AM  Depression screen PHQ 2/9  Decreased Interest 0 0 0  Down, Depressed, Hopeless 0 0 0  PHQ - 2 Score 0 0 0  Altered sleeping 0 0 2  Tired, decreased energy 0 0 2  Change in appetite 0 0 0  Feeling bad or failure about yourself  0 0 0  Trouble concentrating 0 0 0  Moving slowly or fidgety/restless 0 0 0  Suicidal thoughts 0 0 0  PHQ-9 Score 0 0 4  Difficult doing work/chores Not difficult at all Not difficult at all Not difficult at all    BP Readings from Last 3 Encounters:  01/13/24 122/74  08/31/23 116/68  08/14/23 (!) 140/82    Physical Exam Vitals and nursing note reviewed.  Constitutional:      General: She is not in acute distress.    Appearance: Normal appearance. She is well-developed.  HENT:     Head: Normocephalic and atraumatic.  Cardiovascular:     Rate and Rhythm: Normal rate and regular rhythm.     Heart sounds: No murmur heard. Pulmonary:     Effort: Pulmonary effort is normal. No respiratory distress.     Breath sounds: No wheezing or rhonchi.  Musculoskeletal:     Cervical back: Normal range of motion.     Right lower leg: No edema.     Left lower leg: No edema.  Lymphadenopathy:     Cervical: No cervical adenopathy.  Skin:    General: Skin is warm and dry.     Capillary Refill: Capillary refill takes less than 2 seconds.     Findings: No rash.  Neurological:     General: No focal deficit present.     Mental Status: She is alert and oriented to person, place, and time.  Psychiatric:        Mood and Affect: Mood normal.        Behavior: Behavior normal.    Diabetic Foot Exam - Simple   Simple Foot Form Diabetic Foot exam was performed with the following  findings: Yes 01/13/2024  3:13 PM  Visual Inspection No deformities, no ulcerations, no other skin breakdown bilaterally: Yes Sensation Testing Intact to touch and monofilament testing bilaterally: Yes Pulse Check Posterior Tibialis and Dorsalis pulse intact bilaterally: Yes Comments Thickened nails, thick calluses Normal sensation      Wt Readings from Last 3 Encounters:  01/13/24 164 lb (74.4 kg)  08/31/23 164 lb 2 oz (74.4 kg)  08/14/23 162 lb (73.5 kg)    BP 122/74   Pulse 74   Ht 5' 2 (1.575 m)   Wt 164 lb (74.4 kg)   SpO2 98%   BMI 30.00 kg/m   Assessment and Plan:  Problem List Items Addressed This Visit       Unprioritized   DM (diabetes mellitus), type 2 with complications (HCC) (Chronic)   Blood sugars have been stable.  No hypoglycemic events since last visit. Currently medications are Farxiga  and glimepiride . Last visit medical regimen changes were none. Lab Results  Component Value Date   HGBA1C 6.4 (A) 01/13/2024   A1c today = 6.4       Relevant Medications   glucose blood (ONETOUCH ULTRA) test strip   Lancets (ONETOUCH ULTRASOFT) lancets   Other Relevant Orders   POCT glycosylated hemoglobin (Hb A1C) (Completed)   Essential (primary) hypertension - Primary (Chronic)   Blood pressure is well controlled on losartan , hydrochlorothiazide  and amlodipine . No medication side effects noted. Plan to continue current medications.       Pre-ulcerative calluses   Relevant Orders   Ambulatory referral to Podiatry   Porokeratosis   She was seeing [facility] but wants to see podiatry closer to home. Refer to Dr. Ashley.      Relevant Orders   Ambulatory referral to Podiatry   Other Visit Diagnoses       Long term current use of oral hypoglycemic  drug           Return in about 4 months (around 05/14/2024) for CPX.    Leita HILARIO Adie, MD [facility] Medicine [place]

## 2024-01-13 NOTE — Assessment & Plan Note (Signed)
 Blood pressure is well controlled on losartan , hydrochlorothiazide  and amlodipine . No medication side effects noted. Plan to continue current medications.

## 2024-01-13 NOTE — Assessment & Plan Note (Signed)
 Blood sugars have been stable.  No hypoglycemic events since last visit. Currently medications are Farxiga  and glimepiride . Last visit medical regimen changes were none. Lab Results  Component Value Date   HGBA1C 6.4 (A) 01/13/2024   A1c today = 6.4

## 2024-02-25 ENCOUNTER — Telehealth: Payer: Self-pay

## 2024-02-25 ENCOUNTER — Telehealth: Payer: Self-pay | Admitting: Internal Medicine

## 2024-02-25 ENCOUNTER — Other Ambulatory Visit: Payer: Self-pay

## 2024-02-25 MED ORDER — BLOOD GLUCOSE TEST VI STRP: 1.0000 | ORAL_STRIP | Freq: Three times a day (TID) | 12 refills | Status: AC

## 2024-02-25 MED ORDER — LANCET DEVICE MISC
1.0000 | Freq: Three times a day (TID) | 0 refills | Status: AC
Start: 2024-02-25 — End: 2024-03-26

## 2024-02-25 MED ORDER — LANCETS MISC. MISC: 1.0000 | Freq: Three times a day (TID) | 0 refills | Status: AC

## 2024-02-25 MED ORDER — BLOOD GLUCOSE MONITORING SUPPL DEVI: 1.0000 | Freq: Three times a day (TID) | 0 refills | Status: DC

## 2024-02-25 NOTE — Telephone Encounter (Signed)
 Copied from CRM [id number]. Topic: Appointments - Appointment Cancel/Reschedule >> Feb 25, 2024  8:43 AM [name] wrote: Pt says she is not able to do the patient outreach appt she is scheduled for on 10/15 Please contact pt to reschedule. She is asking to be seen on 10/28

## 2024-02-25 NOTE — Telephone Encounter (Signed)
 Copied from CRM #[id number]. Topic: Clinical - Prescription Issue >> Feb 24, 2024  3:47 PM Harlene ORN wrote: Reason for CRM: Patient was told yesterday to ask to call the Practice and give them the name of her old blood sugar machine because they will be replace it with the new product.  The name is One Touch Delica Plus. Requesting to speak to the nurse. >> Feb 25, 2024  8:17 AM Tobias CROME wrote: Patient requesting to speak to [facility]. Requesting callback: [phone]

## 2024-02-25 NOTE — Telephone Encounter (Signed)
 Called patient and could not reach. Sent in One Touch Delica Plus Monitor.  Pharmacy will dispense if insurance covers this.

## 2024-03-02 ENCOUNTER — Other Ambulatory Visit: Payer: Medicare HMO | Admitting: Pharmacist

## 2024-03-02 LAB — HM DIABETES EYE EXAM

## 2024-03-12 ENCOUNTER — Other Ambulatory Visit: Payer: Self-pay | Admitting: Internal Medicine

## 2024-03-12 DIAGNOSIS — E118 Type 2 diabetes mellitus with unspecified complications: Secondary | ICD-10-CM

## 2024-03-14 NOTE — Telephone Encounter (Signed)
 Requested Prescriptions  Pending Prescriptions Disp Refills   glimepiride  (AMARYL ) 2 MG tablet [Pharmacy Med Name: GLIMEPIRIDE  2 MG TABLET] 90 tablet 1    Sig: TAKE 1 TABLET BY MOUTH EVERY DAY BEFORE BREAKFAST     Endocrinology:  Diabetes - Sulfonylureas Passed - 03/14/2024  5:33 PM      Passed - HBA1C is between 0 and 7.9 and within 180 days    Hemoglobin A1C  Date Value Ref Range Status  01/13/2024 6.4 (A) 4.0 - 5.6 % Final   Hgb A1c MFr Bld  Date Value Ref Range Status  05/04/2023 7.4 (H) 4.8 - 5.6 % Final    Comment:             Prediabetes: 5.7 - 6.4          Diabetes: >6.4          Glycemic control for adults with diabetes: <7.0          Passed - Cr in normal range and within 360 days    Creatinine, Ser  Date Value Ref Range Status  06/02/2023 0.65 0.44 - 1.00 mg/dL Final         Passed - Valid encounter within last 6 months    Recent Outpatient Visits           2 months ago Essential (primary) hypertension   [place] [facility] at [facility], Leita DEL, MD   6 months ago DM (diabetes mellitus), type 2 with complications [facility])   [place] [facility] at [facility], Leita DEL, MD   7 months ago Seasonal allergic rhinitis due to pollen   [facility] at [facility], Selinda PARAS, MD   7 months ago Acute bilateral knee pain   [facility] at [facility], Selinda PARAS, MD

## 2024-03-16 ENCOUNTER — Telehealth: Payer: Self-pay | Admitting: Pharmacist

## 2024-03-16 ENCOUNTER — Other Ambulatory Visit: Payer: Self-pay | Admitting: Pharmacist

## 2024-03-16 DIAGNOSIS — E118 Type 2 diabetes mellitus with unspecified complications: Secondary | ICD-10-CM

## 2024-03-16 DIAGNOSIS — E1169 Type 2 diabetes mellitus with other specified complication: Secondary | ICD-10-CM

## 2024-03-16 NOTE — Progress Notes (Signed)
 Dr. Justus,  During follow up with patient today, she mentioned that she has noticed an increase in daytime sedation. When asked about snoring, reports that niece has complained about her loud snoring on a regular basis.  Calculate a STOP-BANG score for patient of 4, indicating high risk of OSA. Would you please consider reviewing/consider ordering screening patient for OSA?  Thank you!  Sharyle Sia, PharmD, [facility] [phone]

## 2024-03-16 NOTE — Progress Notes (Addendum)
 03/16/2024 Name: Kathy Dougherty MRN: [id number] DOB: 02-03-1951  Chief Complaint  Patient presents with   Medication Assistance    Kathy Dougherty is a 73 y.o. year old female who presented for a telephone visit.   They were referred to the pharmacist by their PCP for assistance in managing medication access.      Subjective:   Care Team: Primary Care Provider: Justus Leita VEAR, MD ; Next Scheduled Visit: 05/05/2024    Medication Access/Adherence  Current Pharmacy:  [facility] [address] [place] - [address] [place] [address] Phone: [phone] Fax: [phone]  [id number] [place], [place] - [address] [place] [address] Phone: [phone] Fax: [phone]  [facility] - [place], [place] - [address] [place] [address] Phone: [phone] Fax: [phone]   Patient reports affordability concerns with their medications: No  Patient reports access/transportation concerns to their pharmacy: No  Patient reports adherence concerns with their medications:  No     Uses weekly pillbox and phone reminder alarms to aid with adherence   During our conversation, patient shares that she has been having more daytime sedation. Admits to being told by her niece that she snores loudly   Diabetes:   Current medications:  - Farxiga  10 mg daily - Glimepiride  2 mg daily with breakfast   Recent morning fasting blood sugar readings ranging 100-105 Recently as was needing new blood sugar monitor, but has picked up new Accu-Chek Guide meter   Patient denies hypoglycemic s/sx including dizziness, shakiness, sweating.    Statin therapy: simvastatin  10 mg daily   Current physical activity: reports stays active walking throughout the day  Current medication access support: enrolled in patient assistance for Farxiga  from [employer]Me through 05/18/2024   Hypertension:  Current medications:  -  amlodipine  2.5 mg daily - HCTZ 25 mg daily - losartan  50 mg daily  Denies checking home blood pressure recently as needing new monitor  Patient denies hypotensive s/sx including dizziness, lightheadedness.   Current physical activity: reports stays active walking throughout the day   Objective:  Lab Results  Component Value Date   HGBA1C 6.4 (A) 01/13/2024    Lab Results  Component Value Date   CREATININE 0.65 06/02/2023   BUN 14 06/02/2023   NA 138 06/02/2023   K 3.2 (L) 06/02/2023   CL 104 06/02/2023   CO2 24 06/02/2023    Lab Results  Component Value Date   CHOL 115 05/04/2023   HDL 46 05/04/2023   LDLCALC 56 05/04/2023   TRIG 56 05/04/2023   CHOLHDL 2.5 05/04/2023   BP Readings from Last 3 Encounters:  01/13/24 122/74  08/31/23 116/68  08/14/23 (!) 140/82   Pulse Readings from Last 3 Encounters:  01/13/24 74  08/31/23 74  08/14/23 83     Medications Reviewed Today     Reviewed by Alana Sharyle LABOR, RPH-CPP (Pharmacist) on 03/16/24 at 1109  Med List Status: <None>   Medication Order Taking? Sig Documenting Provider Last Dose Status Informant  amLODipine  (NORVASC ) 2.5 MG tablet [id number]  TAKE 1 TABLET BY MOUTH EVERY DAY Berglund, Laura H, MD  Active   azelastine  (ASTELIN ) 0.1 % nasal spray [id number]  Place 2 sprays into both nostrils 2 (two) times daily. Use in each nostril as directed Alvia Selinda PARAS, MD  Active   benzonatate  (TESSALON  PERLES) 100 MG capsule [id number]  Take  1-2 capsules (100-200 mg total) by mouth 3 (three) times daily as needed for cough. Alvia Selinda PARAS, MD  Active   blood glucose meter kit and supplies KIT [id number]  Dispense based on patient and insurance preference. Test BS bid Justus Leita DEL, MD  Active   Blood Glucose Monitoring Suppl DEVI [id number]  1 each by Does not apply route in the morning, at noon, and at bedtime. May substitute to any manufacturer covered by patient's insurance. Justus Leita DEL, MD  Active    dapagliflozin  propanediol (FARXIGA ) 10 MG TABS tablet [id number] Yes Take 1 tablet (10 mg total) by mouth daily before breakfast. Justus Leita DEL, MD  Active            Med Note [place], [facility] A   Wed Jul 01, 2023 11:56 AM)    glimepiride  (AMARYL ) 2 MG tablet [id number] Yes TAKE 1 TABLET BY MOUTH EVERY DAY BEFORE BREAKFAST Justus Leita DEL, MD  Active   Glucose Blood (BLOOD GLUCOSE TEST STRIPS) STRP [id number]  1 each by Does not apply route in the morning, at noon, and at bedtime. May substitute to any manufacturer covered by patient's insurance. Justus Leita DEL, MD  Active   glucose blood [facility] ULTRA) test strip [id number]  1 each by Other route 2 (two) times daily. use for testing Justus Leita DEL, MD  Active   hydrochlorothiazide  (HYDRODIURIL ) 25 MG tablet [id number]  Take 1 tablet (25 mg total) by mouth daily. Justus Leita DEL, MD  Active   Lancet Device MISC [id number]  1 each by Does not apply route in the morning, at noon, and at bedtime. May substitute to any manufacturer covered by patient's insurance. Justus Leita DEL, MD  Active   Lancets [facility] ULTRASOFT) lancets [id number]  Use as instructed Justus Leita DEL, MD  Active   Lancets Misc. MISC [id number]  1 each by Does not apply route in the morning, at noon, and at bedtime. May substitute to any manufacturer covered by patient's insurance. Justus Leita DEL, MD  Active   losartan  (COZAAR ) 50 MG tablet [id number]  Take 1 tablet (50 mg total) by mouth daily. Justus Leita DEL, MD  Active   mometasone  (NASONEX ) 50 MCG/ACT nasal spray [id number]  Place 2 sprays into the nose daily. Justus Leita DEL, MD  Active            Med Note CATHY, PAULA   Thu May 28, 2023 10:41 AM) prn  omeprazole  (PRILOSEC) 40 MG capsule [id number]  TAKE 1 CAPSULE (40 MG TOTAL) BY MOUTH DAILY. Justus Leita DEL, MD  Active   potassium chloride  SA (KLOR-CON  M20) 20 MEQ tablet [id number]  TAKE 1 TABLET BY MOUTH EVERY DAY Berglund, Laura H, MD  Active    simvastatin  (ZOCOR ) 10 MG tablet [id number]  TAKE 1 TABLET BY MOUTH EVERYDAY AT BEDTIME Justus Leita DEL, MD  Active               Assessment/Plan:   Based on STOP-BANG tool, patient at high risk of obstructive sleep apnea - Will send message to PCP to request provider consider ordering screening patient for sleep apnea  Diabetes: - Currently controlled - Reviewed goal A1c, goal fasting, and goal 2 hour post prandial glucose - Reviewed dietary modifications including importance of having regular well-balanced meals and snacks throughout the day, while controlling carbohydrate portion sizes - Counsel patient on s/s of low blood sugar and how to treat lows  Encourage patient to pick up glucose tablets to carry with her             Advise patient against skipping meals - Recommend to check glucose, keep log of results and have this record to review at upcoming medical appointments. Patient to contact provider office sooner if needed for readings outside of established parameters or symptoms - Patient to follow up with AZ[employer]Me patient assistance program as needed for refills of Farxiga .  - Will ask CPhT Suzen Mall to support patient with applying for re-enrollment    Hypertension: - Currently controlled - Reviewed long term cardiovascular and renal outcomes of uncontrolled blood pressure - Planning to obtain an upper arm blood pressure monitor. - Counsel on blood pressure monitoring technique - Recommend to restart monitoring home blood pressure, keep log of results and have this record to review at upcoming medical appointments. Patient to contact provider office sooner if needed for readings outside of established parameters or symptoms    Follow Up Plan: Clinical Pharmacist will follow up with patient by telephone on 06/15/2024 at 11:00 AM     Sharyle Sia, PharmD, [facility] [phone]

## 2024-03-16 NOTE — Patient Instructions (Signed)
 Goals Addressed             This Visit's Progress    Pharmacy Goals       Please watch the mail for an envelope from [facility] containing the patient assistance program application. Please complete this application and bring to office to have it faxed back to Attention: Suzen Mall at Fax # [phone] along with a copy of your Medicare Part D prescription card and a copy of your proof of income document.  If you need to call Suzen, you can reach her at [phone].  If you need to reach out to patient assistance programs regarding refills or to find out the status of your application, you can do so by calling:  AZ&Me at [phone]  The goal A1c is less than 7%. This is the best way to reduce the risk of the long term complications of diabetes, including heart disease, kidney disease, eye disease, strokes, and nerve damage. An A1c of less than 7% corresponds with fasting sugars less than 130 and 2 hour after meal sugars less than 180.  Our goal bad cholesterol, or LDL, is less than 70 . This is why it is important to continue taking your simvastatin .  Check your blood pressure twice weekly, and any time you have concerning symptoms like headache, chest pain, dizziness, shortness of breath, or vision changes.   Our goal is less than 130/80.  To appropriately check your blood pressure, make sure you do the following:  1) Avoid caffeine, exercise, or tobacco products for 30 minutes before checking. Empty your bladder. 2) Sit with your back supported in a flat-backed chair. Rest your arm on something flat (arm of the chair, table, etc). 3) Sit still with your feet flat on the floor, resting, for at least 5 minutes.  4) Check your blood pressure. Take 1-2 readings.  5) Write down these readings and bring with you to any provider appointments.  Bring your home blood pressure machine with you to a provider's office for accuracy comparison at least once a year.    Make sure you take your blood pressure medications before you come to any office visit, even if you were asked to fast for labs.   Sharyle Sia, PharmD, [facility] [phone]

## 2024-03-16 NOTE — Telephone Encounter (Signed)
 Ordered through [employer] and placed in box for signature and to be faxed.

## 2024-03-17 ENCOUNTER — Telehealth: Payer: Self-pay

## 2024-03-17 NOTE — Telephone Encounter (Signed)
 PAP: Patient assistance application for Farxiga through [employer] (AZ&Me) has been mailed to pt's home address on file. Provider portion of application will be faxed to provider's office.

## 2024-03-21 ENCOUNTER — Telehealth: Payer: Self-pay | Admitting: Pharmacist

## 2024-03-21 ENCOUNTER — Other Ambulatory Visit: Payer: Self-pay | Admitting: Internal Medicine

## 2024-03-21 DIAGNOSIS — K219 Gastro-esophageal reflux disease without esophagitis: Secondary | ICD-10-CM

## 2024-03-21 DIAGNOSIS — E876 Hypokalemia: Secondary | ICD-10-CM

## 2024-03-21 DIAGNOSIS — I10 Essential (primary) hypertension: Secondary | ICD-10-CM

## 2024-03-21 DIAGNOSIS — E1169 Type 2 diabetes mellitus with other specified complication: Secondary | ICD-10-CM

## 2024-03-21 MED ORDER — SIMVASTATIN 10 MG PO TABS: ORAL_TABLET | ORAL | 1 refills | Status: AC

## 2024-03-21 MED ORDER — HYDROCHLOROTHIAZIDE 25 MG PO TABS: 25.0000 mg | ORAL_TABLET | Freq: Every day | ORAL | 1 refills | Status: AC

## 2024-03-21 MED ORDER — OMEPRAZOLE 40 MG PO CPDR: 40.0000 mg | DELAYED_RELEASE_CAPSULE | Freq: Every day | ORAL | 1 refills | Status: AC

## 2024-03-21 MED ORDER — AMLODIPINE BESYLATE 2.5 MG PO TABS: 2.5000 mg | ORAL_TABLET | Freq: Every day | ORAL | 1 refills | Status: AC

## 2024-03-21 MED ORDER — LOSARTAN POTASSIUM 50 MG PO TABS: 50.0000 mg | ORAL_TABLET | Freq: Every day | ORAL | 1 refills | Status: AC

## 2024-03-21 MED ORDER — POTASSIUM CHLORIDE CRYS ER 20 MEQ PO TBCR: 20.0000 meq | EXTENDED_RELEASE_TABLET | Freq: Every day | ORAL | 1 refills | Status: DC

## 2024-03-21 NOTE — Progress Notes (Signed)
 Patient requesting renewal of her amlodipine , HCTZ, losartan , omeprazole , potassium chloride  and simvastatin  as current prescriptions are currently out of refills and/or expired  Would you mind sending [facility] of these 6 medications [facility]?  Thank you!  Sharyle Sia, PharmD, [facility] [phone]

## 2024-03-21 NOTE — Progress Notes (Unsigned)
 Date:  03/21/2024   Name:  Kathy Dougherty   DOB:  05-11-1951   MRN:  [id number]   Chief Complaint: No chief complaint on file.  HPI  Review of Systems   Lab Results  Component Value Date   NA 138 06/02/2023   K 3.2 (L) 06/02/2023   CO2 24 06/02/2023   GLUCOSE 91 06/02/2023   BUN 14 06/02/2023   CREATININE 0.65 06/02/2023   CALCIUM 9.3 06/02/2023   EGFR 95 05/04/2023   GFRNONAA >60 06/02/2023   Lab Results  Component Value Date   CHOL 115 05/04/2023   HDL 46 05/04/2023   LDLCALC 56 05/04/2023   TRIG 56 05/04/2023   CHOLHDL 2.5 05/04/2023   Lab Results  Component Value Date   TSH 1.120 05/04/2023   Lab Results  Component Value Date   HGBA1C 6.4 (A) 01/13/2024   Lab Results  Component Value Date   WBC 4.7 05/04/2023   HGB 12.8 05/04/2023   HCT 39.6 05/04/2023   MCV 86 05/04/2023   PLT 219 05/04/2023   Lab Results  Component Value Date   ALT 21 05/04/2023   AST 22 05/04/2023   ALKPHOS 83 05/04/2023   BILITOT 0.5 05/04/2023   No results found for: MARIEN BOLLS, VD25OH   Patient Active Problem List   Diagnosis Date Noted   Acute bilateral knee pain 07/21/2023   Encounter for screening colonoscopy 06/02/2023   Polyp of ascending colon 06/02/2023   Lipoma of colon 06/02/2023   Hypokalemia 04/29/2022   Hypercalcemia 01/07/2022   Aortic arch atherosclerosis 07/02/2021   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   Lipoma of lateral chest wall 04/18/2020   Porokeratosis 08/05/2019   Hyperlipidemia associated with type 2 diabetes mellitus (HCC) 03/26/2017   Iritis of left eye 03/26/2017   Pre-ulcerative calluses 03/26/2017   Arthritis of right knee 01/15/2016   DM (diabetes mellitus), type 2 with complications (HCC) 03/15/2015   Abnormal LFTs 03/15/2015   Essential (primary) hypertension 03/15/2015   Gastro-esophageal reflux disease without esophagitis 03/15/2015   Allergic rhinitis, seasonal 03/15/2015    Allergies  Allergen Reactions    Ace Inhibitors Cough   Metformin  And Related Diarrhea   Penicillins Rash   Sulfa Antibiotics Rash    Past Surgical History:  Procedure Laterality Date   CATARACT EXTRACTION Right 08/2014   CATARACT EXTRACTION Left 04/2016   COLONOSCOPY WITH PROPOFOL  N/A 06/02/2023   Procedure: COLONOSCOPY WITH PROPOFOL ;  Surgeon: Unk Corinn Skiff, MD;  Location: [facility];  Service: Endoscopy;  Laterality: N/A;  Diabetic   HAMMER TOE SURGERY     PARTIAL HYSTERECTOMY     fibroids   POLYPECTOMY  06/02/2023   Procedure: POLYPECTOMY;  Surgeon: Unk Corinn Skiff, MD;  Location: [facility];  Service: Endoscopy;;    Social History   Tobacco Use   Smoking status: Never   Smokeless tobacco: Never  Vaping Use   Vaping status: Never Used  Substance Use Topics   Alcohol use: No    Alcohol/week: 0.0 standard drinks of alcohol   Drug use: No     Medication list has been reviewed and updated.  No outpatient medications have been marked as taking for the 03/21/24 encounter (Orders Only) with Justus Leita VEAR, MD.       08/31/2023    9:49 AM 05/01/2023    9:39 AM 03/27/2023    2:50 PM 02/10/2023    2:05 PM  GAD 7 : Generalized Anxiety Score  Nervous,  Anxious, on Edge 0 0 0 0  Control/stop worrying 0 0 0 0  Worry too much - different things 0 0 0 0  Trouble relaxing 0 0 0 1  Restless 0 0 0 0  Easily annoyed or irritable 0 0 0 0  Afraid - awful might happen 0 0 0 0  Total GAD 7 Score 0 0 0 1  Anxiety Difficulty Not difficult at all Not difficult at all Not difficult at all Not difficult at all       08/31/2023    9:48 AM 05/28/2023   10:46 AM 05/01/2023    9:39 AM  Depression screen PHQ 2/9  Decreased Interest 0 0 0  Down, Depressed, Hopeless 0 0 0  PHQ - 2 Score 0 0 0  Altered sleeping 0 0 2  Tired, decreased energy 0 0 2  Change in appetite 0 0 0  Feeling bad or failure about yourself  0 0 0  Trouble concentrating 0 0 0  Moving slowly or fidgety/restless 0 0 0   Suicidal thoughts 0 0 0  PHQ-9 Score 0 0 4  Difficult doing work/chores Not difficult at all Not difficult at all Not difficult at all    BP Readings from Last 3 Encounters:  01/13/24 122/74  08/31/23 116/68  08/14/23 (!) 140/82    Physical Exam  Wt Readings from Last 3 Encounters:  01/13/24 164 lb (74.4 kg)  08/31/23 164 lb 2 oz (74.4 kg)  08/14/23 162 lb (73.5 kg)    There were no vitals taken for this visit.  Assessment and Plan:  Problem List Items Addressed This Visit   None   No follow-ups on file.    Leita HILARIO Adie, MD [facility] [place]

## 2024-04-21 ENCOUNTER — Other Ambulatory Visit (HOSPITAL_COMMUNITY): Payer: Self-pay

## 2024-04-21 NOTE — Telephone Encounter (Signed)
 Received patient portion PAP application Farxiga  (AZ&ME).

## 2024-04-25 NOTE — Telephone Encounter (Signed)
 PAP: Application for Marcelline Deist has been submitted to [employer] (AZ&Me), via fax

## 2024-04-28 ENCOUNTER — Other Ambulatory Visit: Payer: Self-pay | Admitting: Internal Medicine

## 2024-04-28 NOTE — Telephone Encounter (Signed)
 PAP: Patient assistance application for Farxiga  has been approved by PAP Companies: AZ&ME from 05/19/2024 to 05/18/2025. Medication should be delivered to PAP Delivery: Home. For further shipping updates, please contact [employer] (AZ&Me) at [phone]. Patient ID is: [id number]

## 2024-05-02 NOTE — Telephone Encounter (Signed)
 Requested Prescriptions  Pending Prescriptions Disp Refills   Blood Glucose Monitoring Suppl (ACCU-CHEK GUIDE ME) w/Device KIT [Pharmacy Med Name: ACCU-CHEK GUIDE ME GLUCOSE MTR] 1 kit 0    Sig: 1 EACH BY DOES NOT APPLY ROUTE IN THE MORNING, AT NOON, AND AT BEDTIME.     Endocrinology: Diabetes - Testing Supplies Passed - 05/02/2024 11:29 AM      Passed - Valid encounter within last 12 months    Recent Outpatient Visits           3 months ago Essential (primary) hypertension   [place] [facility] at [facility], Leita DEL, MD   8 months ago DM (diabetes mellitus), type 2 with complications [facility]   [place] [facility] at [facility], Leita DEL, MD   8 months ago Seasonal allergic rhinitis due to pollen   [facility] at [facility], Selinda PARAS, MD   9 months ago Acute bilateral knee pain   [facility] at [facility], Selinda PARAS, MD

## 2024-05-05 ENCOUNTER — Ambulatory Visit: Admitting: Internal Medicine

## 2024-05-05 ENCOUNTER — Encounter: Payer: Self-pay | Admitting: Internal Medicine

## 2024-05-05 VITALS — BP 122/72 | HR 78 | Ht 62.0 in | Wt 169.0 lb

## 2024-05-05 DIAGNOSIS — Z7984 Long term (current) use of oral hypoglycemic drugs: Secondary | ICD-10-CM | POA: Diagnosis not present

## 2024-05-05 DIAGNOSIS — E785 Hyperlipidemia, unspecified: Secondary | ICD-10-CM

## 2024-05-05 DIAGNOSIS — E118 Type 2 diabetes mellitus with unspecified complications: Secondary | ICD-10-CM

## 2024-05-05 DIAGNOSIS — Z1231 Encounter for screening mammogram for malignant neoplasm of breast: Secondary | ICD-10-CM | POA: Diagnosis not present

## 2024-05-05 DIAGNOSIS — Z23 Encounter for immunization: Secondary | ICD-10-CM

## 2024-05-05 DIAGNOSIS — Z13820 Encounter for screening for osteoporosis: Secondary | ICD-10-CM | POA: Diagnosis not present

## 2024-05-05 DIAGNOSIS — Z0000 Encounter for general adult medical examination without abnormal findings: Secondary | ICD-10-CM | POA: Diagnosis not present

## 2024-05-05 DIAGNOSIS — E1169 Type 2 diabetes mellitus with other specified complication: Secondary | ICD-10-CM | POA: Diagnosis not present

## 2024-05-05 DIAGNOSIS — I10 Essential (primary) hypertension: Secondary | ICD-10-CM | POA: Diagnosis not present

## 2024-05-05 NOTE — Progress Notes (Signed)
 Date:  05/05/2024   Name:  Kathy Dougherty   DOB:  Oct 18, 1950   MRN:  [id number]   Chief Complaint: Annual Exam Kathy Dougherty is a 73 y.o. female who presents today for her Complete Annual Exam. She feels well. She reports exercising walking daily. She reports she is sleeping well. Breast complaints none. Health Maintenance  Topic Date Due   DTaP/Tdap/Td vaccine (1 - Tdap) Never done   Zoster (Shingles) Vaccine (1 of 2) 02/09/1970   Osteoporosis screening with Bone Density Scan  08/19/2022   Breast Cancer Screening  05/07/2024   Medicare Annual Wellness Visit  05/27/2024   Yearly kidney function blood test for diabetes  06/01/2024   COVID-19 Vaccine (5 - 2025-26 season) 05/21/2024*   Hemoglobin A1C  07/15/2024   Yearly kidney health urinalysis for diabetes  08/30/2024   Complete foot exam   01/12/2025   Eye exam for diabetics  03/02/2025   Colon Cancer Screening  06/01/2033   Pneumococcal Vaccine for age over 32  Completed   Flu Shot  Completed   Hepatitis C Screening  Addressed   Meningitis B Vaccine  Aged Out  *Topic was postponed. The date shown is not the original due date.    Hypertension This is a chronic problem. The problem is controlled. Pertinent negatives include no chest pain, headaches, palpitations or shortness of breath.  Hyperlipidemia This is a chronic problem. The problem is controlled. Pertinent negatives include no chest pain, myalgias or shortness of breath. Current antihyperlipidemic treatment includes statins.  Diabetes She presents for her follow-up diabetic visit. She has type 2 diabetes mellitus. Her disease course has been stable. Pertinent negatives for hypoglycemia include no dizziness or headaches. Pertinent negatives for diabetes include no chest pain, no fatigue and no weakness.    Review of Systems  Constitutional:  Negative for fatigue and unexpected weight change.  HENT:  Negative for trouble swallowing.   Eyes:  Negative for visual  disturbance.  Respiratory:  Negative for cough, chest tightness, shortness of breath and wheezing.   Cardiovascular:  Negative for chest pain, palpitations and leg swelling.  Gastrointestinal:  Negative for abdominal pain, constipation and diarrhea.  Genitourinary:  Negative for dysuria and hematuria.  Musculoskeletal:  Negative for arthralgias and myalgias.  Neurological:  Negative for dizziness, weakness, light-headedness and headaches.     Lab Results  Component Value Date   NA 138 06/02/2023   K 3.2 (L) 06/02/2023   CO2 24 06/02/2023   GLUCOSE 91 06/02/2023   BUN 14 06/02/2023   CREATININE 0.65 06/02/2023   CALCIUM 9.3 06/02/2023   EGFR 95 05/04/2023   GFRNONAA >60 06/02/2023   Lab Results  Component Value Date   CHOL 115 05/04/2023   HDL 46 05/04/2023   LDLCALC 56 05/04/2023   TRIG 56 05/04/2023   CHOLHDL 2.5 05/04/2023   Lab Results  Component Value Date   TSH 1.120 05/04/2023   Lab Results  Component Value Date   HGBA1C 6.4 (A) 01/13/2024   Lab Results  Component Value Date   WBC 4.7 05/04/2023   HGB 12.8 05/04/2023   HCT 39.6 05/04/2023   MCV 86 05/04/2023   PLT 219 05/04/2023   Lab Results  Component Value Date   ALT 21 05/04/2023   AST 22 05/04/2023   ALKPHOS 83 05/04/2023   BILITOT 0.5 05/04/2023   No results found for: MARIEN BOLLS, VD25OH   Patient Active Problem List   Diagnosis Date Noted  Polyp of ascending colon 06/02/2023   Lipoma of colon 06/02/2023   Hypokalemia 04/29/2022   Hypercalcemia 01/07/2022   Aortic arch atherosclerosis 07/02/2021   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   Lipoma of lateral chest wall 04/18/2020   Porokeratosis 08/05/2019   Hyperlipidemia associated with type 2 diabetes mellitus (HCC) 03/26/2017   Iritis of left eye 03/26/2017   Pre-ulcerative calluses 03/26/2017   Arthritis of right knee 01/15/2016   DM (diabetes mellitus), type 2 with complications (HCC) 03/15/2015   Abnormal LFTs 03/15/2015    Essential (primary) hypertension 03/15/2015   Gastro-esophageal reflux disease without esophagitis 03/15/2015   Allergic rhinitis, seasonal 03/15/2015    Allergies[1]  Past Surgical History:  Procedure Laterality Date   CATARACT EXTRACTION Right 08/2014   CATARACT EXTRACTION Left 04/2016   COLONOSCOPY WITH PROPOFOL  N/A 06/02/2023   Procedure: COLONOSCOPY WITH PROPOFOL ;  Surgeon: Unk Corinn Skiff, MD;  Location: [facility];  Service: Endoscopy;  Laterality: N/A;  Diabetic   HAMMER TOE SURGERY     PARTIAL HYSTERECTOMY     fibroids   POLYPECTOMY  06/02/2023   Procedure: POLYPECTOMY;  Surgeon: Unk Corinn Skiff, MD;  Location: [facility];  Service: Endoscopy;;    Social History[2]   Medication list has been reviewed and updated.  Active Medications[3]     05/05/2024    8:16 AM 08/31/2023    9:49 AM 05/01/2023    9:39 AM 03/27/2023    2:50 PM  GAD 7 : Generalized Anxiety Score  Nervous, Anxious, on Edge 0 0 0 0  Control/stop worrying 0 0 0 0  Worry too much - different things 0 0 0 0  Trouble relaxing 0 0 0 0  Restless 0 0 0 0  Easily annoyed or irritable 0 0 0 0  Afraid - awful might happen 0 0 0 0  Total GAD 7 Score 0 0 0 0  Anxiety Difficulty Not difficult at all Not difficult at all Not difficult at all Not difficult at all       05/05/2024    8:16 AM 08/31/2023    9:48 AM 05/28/2023   10:46 AM  Depression screen PHQ 2/9  Decreased Interest 0 0 0  Down, Depressed, Hopeless 0 0 0  PHQ - 2 Score 0 0 0  Altered sleeping  0 0  Tired, decreased energy  0 0  Change in appetite  0 0  Feeling bad or failure about yourself   0 0  Trouble concentrating  0 0  Moving slowly or fidgety/restless  0 0  Suicidal thoughts  0 0  PHQ-9 Score  0  0   Difficult doing work/chores  Not difficult at all Not difficult at all     Data saved with a previous flowsheet row definition    BP Readings from Last 3 Encounters:  05/05/24 122/72  01/13/24 122/74   08/31/23 116/68    Physical Exam Vitals and nursing note reviewed.  Constitutional:      General: She is not in acute distress.    Appearance: She is well-developed.  HENT:     Head: Normocephalic and atraumatic.     Right Ear: Tympanic membrane and ear canal normal.     Left Ear: Tympanic membrane and ear canal normal.     Nose:     Right Sinus: No maxillary sinus tenderness.     Left Sinus: No maxillary sinus tenderness.  Eyes:     General: No scleral icterus.  Right eye: No discharge.        Left eye: No discharge.     Conjunctiva/sclera: Conjunctivae normal.  Neck:     Thyroid : No thyromegaly.     Vascular: No carotid bruit.  Cardiovascular:     Rate and Rhythm: Normal rate and regular rhythm.     Pulses: Normal pulses.     Heart sounds: Normal heart sounds.  Pulmonary:     Effort: Pulmonary effort is normal. No respiratory distress.     Breath sounds: No wheezing.  Chest:  Breasts:    Right: Normal.     Left: Normal.  Abdominal:     General: Bowel sounds are normal.     Palpations: Abdomen is soft.     Tenderness: There is no abdominal tenderness.  Musculoskeletal:     Cervical back: Normal range of motion. No erythema.     Right lower leg: No edema.     Left lower leg: No edema.  Lymphadenopathy:     Cervical: No cervical adenopathy.  Skin:    General: Skin is warm and dry.     Findings: No rash.  Neurological:     General: No focal deficit present.     Mental Status: She is alert and oriented to person, place, and time.     Cranial Nerves: No cranial nerve deficit.     Sensory: No sensory deficit.     Deep Tendon Reflexes: Reflexes are normal and symmetric.  Psychiatric:        Attention and Perception: Attention normal.        Mood and Affect: Mood normal.     Wt Readings from Last 3 Encounters:  05/05/24 169 lb (76.7 kg)  01/13/24 164 lb (74.4 kg)  08/31/23 164 lb 2 oz (74.4 kg)    BP 122/72   Pulse 78   Ht 5' 2 (1.575 m)   Wt 169  lb (76.7 kg)   SpO2 98%   BMI 30.91 kg/m   Assessment and Plan:  Problem List Items Addressed This Visit       Unprioritized   DM (diabetes mellitus), type 2 with complications (HCC) (Chronic)   Currently medications are Farxiga  and Amaryl .  No hypoglycemic episodes noted. Last visit medical regimen changes were none. Lab Results  Component Value Date   HGBA1C 6.4 (A) 01/13/2024  Eye exam done.       Relevant Orders   Comprehensive metabolic panel with GFR   Hemoglobin A1c   Essential (primary) hypertension (Chronic)   Well controlled BP today. Regimen is amlodipine , losartan  and hydrochlorothiazide .      Relevant Orders   CBC with Differential/Platelet   Comprehensive metabolic panel with GFR   TSH   Hyperlipidemia associated with type 2 diabetes mellitus (HCC) (Chronic)   LDL is  Lab Results  Component Value Date   LDLCALC 56 05/04/2023    Current medication regimen is simvastatin . Goal LDL is < 70.       Relevant Orders   Lipid panel   Other Visit Diagnoses       Annual physical exam    -  Primary   continue healthy diet, exercise     Encounter for screening mammogram for breast cancer       schedule at [facility] - due this month     Encounter for screening for osteoporosis       Schedule at [facility]     Encounter for immunization       Relevant  Orders   Flu vaccine HIGH DOSE PF(Fluzone Trivalent) (Completed)     Long term current use of oral hypoglycemic drug           Return in about 4 months (around 09/03/2024) for DM, HTN.    Leita HILARIO Adie, MD [facility] [place]           [1]  Allergies Allergen Reactions   Ace Inhibitors Cough   Metformin  And Related Diarrhea   Penicillins Rash   Sulfa Antibiotics Rash  [2]  Social History Tobacco Use   Smoking status: Never   Smokeless tobacco: Never  Vaping Use   Vaping status: Never Used  Substance Use Topics   Alcohol use: No    Alcohol/week: 0.0 standard  drinks of alcohol   Drug use: No  [3]  Current Meds  Medication Sig   amLODipine  (NORVASC ) 2.5 MG tablet Take 1 tablet (2.5 mg total) by mouth daily.   azelastine  (ASTELIN ) 0.1 % nasal spray Place 2 sprays into both nostrils 2 (two) times daily. Use in each nostril as directed   benzonatate  (TESSALON  PERLES) 100 MG capsule Take 1-2 capsules (100-200 mg total) by mouth 3 (three) times daily as needed for cough.   blood glucose meter kit and supplies KIT Dispense based on patient and insurance preference. Test BS bid   Blood Glucose Monitoring Suppl (ACCU-CHEK GUIDE ME) w/Device KIT 1 EACH BY DOES NOT APPLY ROUTE IN THE MORNING, AT NOON, AND AT BEDTIME.   dapagliflozin  propanediol (FARXIGA ) 10 MG TABS tablet Take 1 tablet (10 mg total) by mouth daily before breakfast.   glimepiride  (AMARYL ) 2 MG tablet TAKE 1 TABLET BY MOUTH EVERY DAY BEFORE BREAKFAST   Glucose Blood (BLOOD GLUCOSE TEST STRIPS) STRP 1 each by Does not apply route in the morning, at noon, and at bedtime. May substitute to any manufacturer covered by patient's insurance.   glucose blood (ONETOUCH ULTRA) test strip 1 each by Other route 2 (two) times daily. use for testing   hydrochlorothiazide  (HYDRODIURIL ) 25 MG tablet Take 1 tablet (25 mg total) by mouth daily.   Lancets (ONETOUCH ULTRASOFT) lancets Use as instructed   losartan  (COZAAR ) 50 MG tablet Take 1 tablet (50 mg total) by mouth daily.   mometasone  (NASONEX ) 50 MCG/ACT nasal spray Place 2 sprays into the nose daily.   omeprazole  (PRILOSEC) 40 MG capsule Take 1 capsule (40 mg total) by mouth daily.   potassium chloride  SA (KLOR-CON  M20) 20 MEQ tablet Take 1 tablet (20 mEq total) by mouth daily.   simvastatin  (ZOCOR ) 10 MG tablet TAKE 1 TABLET BY MOUTH EVERYDAY AT BEDTIME

## 2024-05-05 NOTE — Assessment & Plan Note (Signed)
 Well controlled BP today. Regimen is amlodipine , losartan  and hydrochlorothiazide .

## 2024-05-05 NOTE — Assessment & Plan Note (Signed)
 LDL is  Lab Results  Component Value Date   LDLCALC 56 05/04/2023    Current medication regimen is simvastatin . Goal LDL is < 70.

## 2024-05-05 NOTE — Assessment & Plan Note (Addendum)
 Currently medications are Farxiga  and Amaryl .  No hypoglycemic episodes noted. Last visit medical regimen changes were none. Lab Results  Component Value Date   HGBA1C 6.4 (A) 01/13/2024  Eye exam done.

## 2024-05-06 LAB — COMPREHENSIVE METABOLIC PANEL WITH GFR
ALT: 15 IU/L (ref 0–32)
AST: 19 IU/L (ref 0–40)
Albumin: 4.3 g/dL (ref 3.8–4.8)
Alkaline Phosphatase: 84 IU/L (ref 49–135)
BUN/Creatinine Ratio: 30 — ABNORMAL HIGH (ref 12–28)
BUN: 22 mg/dL (ref 8–27)
Bilirubin Total: 0.5 mg/dL (ref 0.0–1.2)
CO2: 25 mmol/L (ref 20–29)
Calcium: 10 mg/dL (ref 8.7–10.3)
Chloride: 103 mmol/L (ref 96–106)
Creatinine, Ser: 0.73 mg/dL (ref 0.57–1.00)
Globulin, Total: 3.3 g/dL (ref 1.5–4.5)
Glucose: 140 mg/dL — ABNORMAL HIGH (ref 70–99)
Potassium: 4 mmol/L (ref 3.5–5.2)
Sodium: 141 mmol/L (ref 134–144)
Total Protein: 7.6 g/dL (ref 6.0–8.5)
eGFR: 87 mL/min/1.73

## 2024-05-06 LAB — CBC WITH DIFFERENTIAL/PLATELET
Basophils Absolute: 0 x10E3/uL (ref 0.0–0.2)
Basos: 0 %
EOS (ABSOLUTE): 0.1 x10E3/uL (ref 0.0–0.4)
Eos: 2 %
Hematocrit: 40.8 % (ref 34.0–46.6)
Hemoglobin: 13.3 g/dL (ref 11.1–15.9)
Immature Grans (Abs): 0 x10E3/uL (ref 0.0–0.1)
Immature Granulocytes: 0 %
Lymphocytes Absolute: 1.1 x10E3/uL (ref 0.7–3.1)
Lymphs: 23 %
MCH: 28.7 pg (ref 26.6–33.0)
MCHC: 32.6 g/dL (ref 31.5–35.7)
MCV: 88 fL (ref 79–97)
Monocytes Absolute: 0.6 x10E3/uL (ref 0.1–0.9)
Monocytes: 12 %
Neutrophils Absolute: 3.1 x10E3/uL (ref 1.4–7.0)
Neutrophils: 63 %
Platelets: 206 x10E3/uL (ref 150–450)
RBC: 4.63 x10E6/uL (ref 3.77–5.28)
RDW: 13.2 % (ref 11.7–15.4)
WBC: 4.9 x10E3/uL (ref 3.4–10.8)

## 2024-05-06 LAB — LIPID PANEL
Chol/HDL Ratio: 2.3 ratio (ref 0.0–4.4)
Cholesterol, Total: 112 mg/dL (ref 100–199)
HDL: 49 mg/dL
LDL Chol Calc (NIH): 49 mg/dL (ref 0–99)
Triglycerides: 66 mg/dL (ref 0–149)
VLDL Cholesterol Cal: 14 mg/dL (ref 5–40)

## 2024-05-06 LAB — TSH: TSH: 1.56 u[IU]/mL (ref 0.450–4.500)

## 2024-05-06 LAB — HEMOGLOBIN A1C
Est. average glucose Bld gHb Est-mCnc: 143 mg/dL
Hgb A1c MFr Bld: 6.6 % — ABNORMAL HIGH (ref 4.8–5.6)

## 2024-06-02 ENCOUNTER — Telehealth: Payer: Self-pay | Admitting: Internal Medicine

## 2024-06-02 ENCOUNTER — Telehealth: Payer: Self-pay

## 2024-06-02 NOTE — Telephone Encounter (Signed)
 Please review.  KP

## 2024-06-02 NOTE — Telephone Encounter (Signed)
 Spoke to pt she is having numbness in hands at night. Told pt to take 1 daily. Pt only has 5 days worth of medication left. Pt wants to know what she should do.  KP

## 2024-06-02 NOTE — Telephone Encounter (Signed)
 Duplicate message about medication.  KP

## 2024-06-02 NOTE — Telephone Encounter (Signed)
 Copied from CRM #[id number]. Topic: Appointments - Scheduling Inquiry for Clinic >> Jun 02, 2024 10:27 AM [name] wrote: Reason for CRM: Pt is calling because she was told that she would be Dr Laurance pt but she is scheduled with Dr Larita. I do not see Dr Laurance in this office on the [facility] and tried to confirm with CAL but was placed on hold for a while. Please call pt back if a Dr Laurance is in this office

## 2024-06-02 NOTE — Telephone Encounter (Signed)
 Copied from CRM [id number]. Topic: Clinical - Prescription Issue >> Jun 02, 2024 10:14 AM Mia F wrote: Reason for CRM: Pt says she is out of the potassium chloride  SA (KLOR-CON  M20) 20 MEQ tablet because she has been taking 2 a day. The rx says to take 1 a day. The pharmacy says that she cannot refill until the end of the month. Pt wants to know if the rx changed and if she can still get refills

## 2024-06-02 NOTE — Telephone Encounter (Signed)
 Maybe she means Dr. Manya. Please call to schedule.  KP

## 2024-06-03 ENCOUNTER — Other Ambulatory Visit: Payer: Self-pay

## 2024-06-03 DIAGNOSIS — E876 Hypokalemia: Secondary | ICD-10-CM

## 2024-06-03 NOTE — Telephone Encounter (Signed)
 Ordered. Pt is aware.  KP

## 2024-06-04 LAB — BASIC METABOLIC PANEL WITH GFR
BUN/Creatinine Ratio: 14 (ref 12–28)
BUN: 13 mg/dL (ref 8–27)
CO2: 23 mmol/L (ref 20–29)
Calcium: 9.2 mg/dL (ref 8.7–10.3)
Chloride: 105 mmol/L (ref 96–106)
Creatinine, Ser: 0.94 mg/dL (ref 0.57–1.00)
Glucose: 137 mg/dL — ABNORMAL HIGH (ref 70–99)
Potassium: 3.4 mmol/L — ABNORMAL LOW (ref 3.5–5.2)
Sodium: 143 mmol/L (ref 134–144)
eGFR: 64 mL/min/1.73

## 2024-06-06 ENCOUNTER — Other Ambulatory Visit: Payer: Self-pay

## 2024-06-06 ENCOUNTER — Ambulatory Visit: Payer: Self-pay | Admitting: Student

## 2024-06-06 DIAGNOSIS — E876 Hypokalemia: Secondary | ICD-10-CM

## 2024-06-06 MED ORDER — POTASSIUM CHLORIDE CRYS ER 20 MEQ PO TBCR: 20.0000 meq | EXTENDED_RELEASE_TABLET | Freq: Every day | ORAL | 1 refills | Status: AC

## 2024-06-06 NOTE — Progress Notes (Signed)
 Spoke with patient informed her of lab results and that I have sent RX to [facility] [place] for her. She verbalized understanding.   JM

## 2024-06-08 LAB — HM MAMMOGRAPHY

## 2024-06-08 LAB — HM DEXA SCAN

## 2024-06-09 ENCOUNTER — Telehealth: Payer: Self-pay | Admitting: Internal Medicine

## 2024-06-09 NOTE — Telephone Encounter (Signed)
 Copied from CRM #[id number]. Topic: General - Other >> Jun 09, 2024 11:38 AM Kathy Dougherty wrote: Reason for CRM: Pt forgot AWV  today, I tried rescheduling Epic would not allow me to reschedule please call [phone] pt to schedule appt.

## 2024-06-15 ENCOUNTER — Other Ambulatory Visit: Payer: Self-pay | Admitting: Pharmacist

## 2024-06-15 DIAGNOSIS — E118 Type 2 diabetes mellitus with unspecified complications: Secondary | ICD-10-CM

## 2024-06-15 DIAGNOSIS — I10 Essential (primary) hypertension: Secondary | ICD-10-CM

## 2024-06-15 DIAGNOSIS — Z79899 Other long term (current) drug therapy: Secondary | ICD-10-CM

## 2024-06-15 NOTE — Progress Notes (Signed)
 "  06/15/2024 Name: Kathy Dougherty MRN: [id number] DOB: [birth year]  Chief Complaint  Patient presents with   Medication Management   Medication Adherence    Kathy Dougherty is a 74 y.o. year old female who presented for a telephone visit.   They were referred to the pharmacist by their PCP for assistance in managing medication access.      Subjective:   Care Team: Primary Care Provider: Lemon Raisin, MD ; Next Scheduled Visit: 09/05/2024    Medication Access/Adherence  Current Pharmacy:  [facility] [address], [place] - [address] [place] [address] Phone: [phone] Fax: [phone]  [id number] [place], [place] - [address] [place] [address] Phone: [phone] Fax: [phone]  [facility] - [place], [place] - [address] [place] [address] Phone: [phone] Fax: [phone]   Patient reports affordability concerns with their medications: No  Patient reports access/transportation concerns to their pharmacy: No  Patient reports adherence concerns with their medications:  No     Uses weekly pillbox and phone reminder alarms to aid with adherence    Diabetes:   Current medications:  - Farxiga  10 mg daily - Glimepiride  2 mg daily with breakfast   Using Accu-Chek Guide meter Recent morning fasting blood sugar readings ranging 100-125    Patient denies hypoglycemic s/sx including dizziness, shakiness, sweating.  - Patient carries glucose tablets in case needed   Statin therapy: simvastatin  10 mg daily   Current physical activity: reports stays active walking throughout the day   Current medication access support: enrolled in patient assistance for Farxiga  from [employer] through 05/18/2025     Hypertension:   Current medications:  - amlodipine  2.5 mg daily - HCTZ 25 mg daily - losartan  50 mg daily   Patient has home upper arm monitor; reports recent home BP readings  ranging 112-120/70s   Patient denies hypotensive s/sx including dizziness, lightheadedness.    Current physical activity: reports stays active walking throughout the day     Objective:  Lab Results  Component Value Date   HGBA1C 6.6 (H) 05/05/2024    Lab Results  Component Value Date   CREATININE 0.94 06/03/2024   BUN 13 06/03/2024   NA 143 06/03/2024   K 3.4 (L) 06/03/2024   CL 105 06/03/2024   CO2 23 06/03/2024    Lab Results  Component Value Date   CHOL 112 05/05/2024   HDL 49 05/05/2024   LDLCALC 49 05/05/2024   TRIG 66 05/05/2024   CHOLHDL 2.3 05/05/2024   BP Readings from Last 3 Encounters:  05/05/24 122/72  01/13/24 122/74  08/31/23 116/68   Pulse Readings from Last 3 Encounters:  05/05/24 78  01/13/24 74  08/31/23 74     Medications Reviewed Today     Reviewed by Alana Sharyle LABOR, RPH-CPP (Pharmacist) on 06/15/24 at 1117  Med List Status: <None>   Medication Order Taking? Sig Documenting Provider Last Dose Status Informant  amLODipine  (NORVASC ) 2.5 MG tablet [id number] Yes Take 1 tablet (2.5 mg total) by mouth daily. Justus Leita VEAR, MD  Active   azelastine  (ASTELIN ) 0.1 % nasal spray [id number]  Place 2 sprays into both nostrils 2 (two) times daily. Use in each nostril as directed Alvia Selinda PARAS, MD  Active     Discontinued 06/15/24 1117 (No longer needed (for PRN medications))   blood glucose meter kit and supplies KIT  [id number]  Dispense based on patient and insurance preference. Test BS bid Justus Leita DEL, MD  Active   Blood Glucose Monitoring Suppl (ACCU-CHEK GUIDE ME) w/Device KIT [id number]  1 EACH BY DOES NOT APPLY ROUTE IN THE MORNING, AT NOON, AND AT BEDTIME. Justus Leita DEL, MD  Active   dapagliflozin  propanediol (FARXIGA ) 10 MG TABS tablet [id number] Yes Take 1 tablet (10 mg total) by mouth daily before breakfast. Justus Leita DEL, MD  Active            Med Note [place], [facility] A   Wed Jul 01, 2023 11:56 AM)    glimepiride   (AMARYL ) 2 MG tablet [id number] Yes TAKE 1 TABLET BY MOUTH EVERY DAY BEFORE BREAKFAST Justus Leita DEL, MD  Active   Glucose Blood (BLOOD GLUCOSE TEST STRIPS) STRP [id number]  1 each by Does not apply route in the morning, at noon, and at bedtime. May substitute to any manufacturer covered by patient's insurance. Justus Leita DEL, MD  Active   glucose blood [facility] ULTRA) test strip [id number]  1 each by Other route 2 (two) times daily. use for testing Justus Leita DEL, MD  Active   hydrochlorothiazide  (HYDRODIURIL ) 25 MG tablet [id number] Yes Take 1 tablet (25 mg total) by mouth daily. Justus Leita DEL, MD  Active   Lancets [facility] ULTRASOFT) lancets [id number]  Use as instructed Berglund, Laura H, MD  Active   losartan  (COZAAR ) 50 MG tablet [id number] Yes Take 1 tablet (50 mg total) by mouth daily. Justus Leita DEL, MD  Active   mometasone  (NASONEX ) 50 MCG/ACT nasal spray [id number]  Place 2 sprays into the nose daily. Justus Leita DEL, MD  Active            Med Note CATHY, PAULA   Thu May 28, 2023 10:41 AM) prn  omeprazole  (PRILOSEC) 40 MG capsule [id number]  Take 1 capsule (40 mg total) by mouth daily. Justus Leita DEL, MD  Active   potassium chloride  SA (KLOR-CON  M20) 20 MEQ tablet [id number] Yes Take 1 tablet (20 mEq total) by mouth daily. Lemon Raisin, MD  Active   simvastatin  (ZOCOR ) 10 MG tablet [id number]  TAKE 1 TABLET BY MOUTH EVERYDAY AT BEDTIME Justus Leita DEL, MD  Active               Assessment/Plan:   Based on STOP-BANG tool, patient at high risk of obstructive sleep apnea - Previously collaborated with Dr. Justus to request provider consider ordering screening patient for sleep apnea and order for home sleep study was placed 03/16/2024. However, today patient shares that she has not yet scheduled the sleep study Send message to office to request outreach to patient to provide her with contact information for this scheduling   Diabetes: - Currently controlled -  Reviewed goal A1c, goal fasting, and goal 2 hour post prandial glucose - Reviewed dietary modifications including importance of having regular well-balanced meals and snacks throughout the day, while controlling carbohydrate portion sizes - Counsel patient on s/s of low blood sugar and how to treat lows             Advise patient against skipping meals - Recommend to check glucose, keep log of results and have this record to review at upcoming medical appointments. Patient to contact provider office sooner if needed for readings outside of established parameters or symptoms - Patient to follow up with AZ&Me patient assistance program as needed for refills of Farxiga .     Hypertension: - Currently controlled -  Reviewed long term cardiovascular and renal outcomes of uncontrolled blood pressure - Counsel on blood pressure monitoring technique - Recommend to monitoring home blood pressure, keep log of results and have this record to review at upcoming medical appointments. Patient to contact provider office sooner if needed for readings outside of established parameters or symptoms     Follow Up Plan: Clinical Pharmacist will follow up with patient by telephone on 03/01/2025 at 11:00 AM     Sharyle Sia, PharmD, [facility] [phone]   "

## 2024-06-15 NOTE — Patient Instructions (Signed)
"   Goals Addressed             This Visit's Progress    Pharmacy Goals       If you need to reach out to patient assistance programs regarding refills or to find out the status of your application, you can do so by calling:  [employer] at [phone]  The goal A1c is less than 7%. This is the best way to reduce the risk of the long term complications of diabetes, including heart disease, kidney disease, eye disease, strokes, and nerve damage. An A1c of less than 7% corresponds with fasting sugars less than 130 and 2 hour after meal sugars less than 180.  Our goal bad cholesterol, or LDL, is less than 70 . This is why it is important to continue taking your simvastatin .  Check your blood pressure twice weekly, and any time you have concerning symptoms like headache, chest pain, dizziness, shortness of breath, or vision changes.   Our goal is less than 130/80.  To appropriately check your blood pressure, make sure you do the following:  1) Avoid caffeine, exercise, or tobacco products for 30 minutes before checking. Empty your bladder. 2) Sit with your back supported in a flat-backed chair. Rest your arm on something flat (arm of the chair, table, etc). 3) Sit still with your feet flat on the floor, resting, for at least 5 minutes.  4) Check your blood pressure. Take 1-2 readings.  5) Write down these readings and bring with you to any provider appointments.  Bring your home blood pressure machine with you to a provider's office for accuracy comparison at least once a year.   Make sure you take your blood pressure medications before you come to any office visit, even if you were asked to fast for labs.   Sharyle Sia, PharmD, [facility] [phone]         "

## 2024-06-15 NOTE — Telephone Encounter (Signed)
 Thank you for the message

## 2024-06-15 NOTE — Telephone Encounter (Signed)
 Reviewed chart, recent sleep study referral was placed to [facility] on 03/16/2024. Called the company to verify if referral was received, they indeed received the referral. They have attempted to contact the patient multiply times. Called patient and gave the phone number to the company to have her call. Patient verbalized understanding. Will call back if she has any trouble.

## 2024-09-05 ENCOUNTER — Ambulatory Visit: Admitting: Student

## 2025-03-01 ENCOUNTER — Other Ambulatory Visit: Admitting: Pharmacist

## 2025-05-08 ENCOUNTER — Encounter: Admitting: Family Medicine

## 2025-05-08 ENCOUNTER — Ambulatory Visit

## 2025-05-15 ENCOUNTER — Encounter: Admitting: Student

## 2025-05-18 ENCOUNTER — Encounter: Admitting: Student

## 2025-05-18 ENCOUNTER — Ambulatory Visit

## 2025-06-08 ENCOUNTER — Ambulatory Visit
